# Patient Record
Sex: Male | Born: 1965
Health system: Southern US, Community
[De-identification: ages and names within clinical notes are randomized; demographics above are authoritative.]

## PROBLEM LIST (undated history)

## (undated) DIAGNOSIS — G4733 Obstructive sleep apnea (adult) (pediatric): Secondary | ICD-10-CM

## (undated) DIAGNOSIS — I219 Acute myocardial infarction, unspecified: Secondary | ICD-10-CM

## (undated) DIAGNOSIS — R4701 Aphasia: Secondary | ICD-10-CM

## (undated) DIAGNOSIS — J45909 Unspecified asthma, uncomplicated: Secondary | ICD-10-CM

## (undated) DIAGNOSIS — I201 Angina pectoris with documented spasm: Secondary | ICD-10-CM

## (undated) DIAGNOSIS — R55 Syncope and collapse: Secondary | ICD-10-CM

## (undated) DIAGNOSIS — H3530 Unspecified macular degeneration: Secondary | ICD-10-CM

## (undated) DIAGNOSIS — H409 Unspecified glaucoma: Secondary | ICD-10-CM

## (undated) DIAGNOSIS — I495 Sick sinus syndrome: Secondary | ICD-10-CM

## (undated) DIAGNOSIS — I6529 Occlusion and stenosis of unspecified carotid artery: Secondary | ICD-10-CM

## (undated) DIAGNOSIS — F32A Depression, unspecified: Secondary | ICD-10-CM

## (undated) DIAGNOSIS — G4730 Sleep apnea, unspecified: Secondary | ICD-10-CM

## (undated) DIAGNOSIS — R0600 Dyspnea, unspecified: Secondary | ICD-10-CM

## (undated) DIAGNOSIS — E119 Type 2 diabetes mellitus without complications: Secondary | ICD-10-CM

## (undated) DIAGNOSIS — J449 Chronic obstructive pulmonary disease, unspecified: Secondary | ICD-10-CM

## (undated) DIAGNOSIS — E669 Obesity, unspecified: Secondary | ICD-10-CM

## (undated) HISTORY — DX: Obesity, unspecified: E66.9

## (undated) HISTORY — DX: Syncope and collapse: R55

## (undated) HISTORY — PX: PACEMAKER INSERTION: SHX728

## (undated) HISTORY — DX: Unspecified glaucoma: H40.9

## (undated) HISTORY — PX: INSERT / REPLACE / REMOVE PACEMAKER: SUR710

## (undated) HISTORY — DX: Sick sinus syndrome: I49.5

## (undated) HISTORY — DX: Obstructive sleep apnea (adult) (pediatric): G47.33

## (undated) HISTORY — PX: CARDIAC CATHETERIZATION: SHX172

## (undated) HISTORY — DX: Chronic obstructive pulmonary disease, unspecified: J44.9

## (undated) HISTORY — PX: INCISION AND DRAINAGE ABSCESS: SHX5864

## (undated) HISTORY — DX: Aphasia: R47.01

## (undated) HISTORY — DX: Unspecified macular degeneration: H35.30

## (undated) HISTORY — DX: Angina pectoris with documented spasm: I20.1

## (undated) HISTORY — DX: Occlusion and stenosis of unspecified carotid artery: I65.29

## (undated) HISTORY — DX: Sleep apnea, unspecified: G47.30

---

## 2003-11-27 DIAGNOSIS — I201 Angina pectoris with documented spasm: Secondary | ICD-10-CM | POA: Insufficient documentation

## 2008-04-06 ENCOUNTER — Encounter: Payer: Self-pay | Admitting: Pulmonary Disease

## 2015-07-14 DIAGNOSIS — H409 Unspecified glaucoma: Secondary | ICD-10-CM | POA: Insufficient documentation

## 2015-07-14 DIAGNOSIS — H3530 Unspecified macular degeneration: Secondary | ICD-10-CM | POA: Insufficient documentation

## 2015-07-14 DIAGNOSIS — R4701 Aphasia: Secondary | ICD-10-CM | POA: Insufficient documentation

## 2015-07-14 DIAGNOSIS — R519 Headache, unspecified: Secondary | ICD-10-CM | POA: Insufficient documentation

## 2015-07-14 DIAGNOSIS — G4730 Sleep apnea, unspecified: Secondary | ICD-10-CM | POA: Insufficient documentation

## 2015-07-14 DIAGNOSIS — Z950 Presence of cardiac pacemaker: Secondary | ICD-10-CM | POA: Insufficient documentation

## 2016-02-05 ENCOUNTER — Encounter: Payer: Self-pay | Admitting: *Deleted

## 2016-02-06 ENCOUNTER — Encounter: Payer: Self-pay | Admitting: Cardiology

## 2016-02-06 NOTE — Progress Notes (Deleted)
    Cardiology Office Note  Date: 02/06/2016   ID: Timothy Thomas, DOB 1965/09/02, MRN [id number]  PCP: No primary care provider on file.  Referring provider: Dr. [name]  Consulting Cardiologist: Timothy Dell, MD   No chief complaint on file.   History of Present Illness: Timothy Thomas is a 50 y.o. male referred for cardiology consultation by Dr. [name] (hospitalist at [facility]). He does not have a primary care provider. Very limited records were provided. He was recently admitted to [facility] with chest discomfort and shortness of breath associated with a degree of hypoxemic respiratory failure. He was treated for COPD exacerbation with ongoing tobacco abuse history, improved with nebulizer treatments.  Past Medical History:  Diagnosis Date  . Aphasia    Transient - negative head CT and EEG with neurology workup February 2017 [facility])  . COPD (chronic obstructive pulmonary disease) (HCC)   . Glaucoma   . Macular degeneration   . Obesity   . Obstructive sleep apnea   . Tachycardia-bradycardia syndrome (HCC)    Status post pacemaker  . Variant angina (HCC)    History of normal coronary arteries at cardiac catheterization 2002 - vasospasm noted  . Vasovagal syncope     No past surgical history on file.  Current Outpatient Prescriptions  Medication Sig Dispense Refill  . atorvastatin (LIPITOR) 40 MG tablet Take 40 mg by mouth daily.    . Ipratropium-Albuterol (ALBUTEROL-IPRATROPIUM IN) Inhale 3 mLs into the lungs 4 (four) times daily.     No current facility-administered medications for this visit.    Allergies:  Codeine and Ketorolac   Social History: The patient     Family History: The patient's family history is not on file.   ROS:  Please see the history of present illness. Otherwise, complete review of systems is positive for {NONE DEFAULTED:18576::"none"}.  All other systems are reviewed and negative.   Physical Exam: VS:  There were no vitals  taken for this visit., BMI There is no height or weight on file to calculate BMI.  Wt Readings from Last 3 Encounters:  No data found for Wt    General: Patient appears comfortable at rest. HEENT: Conjunctiva and lids normal, oropharynx clear with moist mucosa. Neck: Supple, no elevated JVP or carotid bruits, no thyromegaly. Lungs: Clear to auscultation, nonlabored breathing at rest. Cardiac: Regular rate and rhythm, no S3 or significant systolic murmur, no pericardial rub. Abdomen: Soft, nontender, no hepatomegaly, bowel sounds present, no guarding or rebound. Extremities: No pitting edema, distal pulses 2+. Skin: Warm and dry. Musculoskeletal: No kyphosis. Neuropsychiatric: Alert and oriented x3, affect grossly appropriate.  ECG: I personally reviewed the tracing from 01/14/2016 which showed normal sinus rhythm with leftward axis, decreased R wave progression and low voltage in the precordial leads.  Recent Labwork:  August 2017: Cholesterol 233, triglycerides 247, HDL 28, LDL 156, BUN 9, creatinine 0.6, AST 14, ALT 21, potassium 4.3, troponin T less than 0.01, NT-proBNP 14.5, hemoglobin 13.2, platelets 232  Other Studies Reviewed Today:  Chest x-ray 01/14/2016 [facility]): No infiltrate or pulmonary edema. Mild basilar atelectasis. Cardiac pacemaker in place.  Assessment and Plan:    Current medicines were reviewed with the patient today.  No orders of the defined types were placed in this encounter.   Disposition:  Signed, Jonelle Sidle, MD, [facility] 02/06/2016 8:04 AM    [facility] at [facility] [address] [place], [place], [place] [address] Phone: [phone]; Fax: [phone]

## 2016-09-06 DIAGNOSIS — I2699 Other pulmonary embolism without acute cor pulmonale: Secondary | ICD-10-CM | POA: Insufficient documentation

## 2016-09-26 ENCOUNTER — Inpatient Hospital Stay (HOSPITAL_COMMUNITY)
Admission: EM | Admit: 2016-09-26 | Discharge: 2016-09-29 | DRG: 176 | Disposition: A | Discharge status: Home / Self Care | Payer: Self-pay | Source: Other Acute Inpatient Hospital | Attending: Cardiovascular Disease | Admitting: Cardiovascular Disease

## 2016-09-26 ENCOUNTER — Encounter (HOSPITAL_COMMUNITY): Payer: Self-pay | Admitting: *Deleted

## 2016-09-26 DIAGNOSIS — F17210 Nicotine dependence, cigarettes, uncomplicated: Secondary | ICD-10-CM | POA: Diagnosis present

## 2016-09-26 DIAGNOSIS — E669 Obesity, unspecified: Secondary | ICD-10-CM | POA: Diagnosis present

## 2016-09-26 DIAGNOSIS — I2699 Other pulmonary embolism without acute cor pulmonale: Principal | ICD-10-CM | POA: Diagnosis present

## 2016-09-26 DIAGNOSIS — I2510 Atherosclerotic heart disease of native coronary artery without angina pectoris: Secondary | ICD-10-CM | POA: Diagnosis present

## 2016-09-26 DIAGNOSIS — Z885 Allergy status to narcotic agent status: Secondary | ICD-10-CM

## 2016-09-26 DIAGNOSIS — E876 Hypokalemia: Secondary | ICD-10-CM | POA: Diagnosis present

## 2016-09-26 DIAGNOSIS — I248 Other forms of acute ischemic heart disease: Secondary | ICD-10-CM | POA: Diagnosis present

## 2016-09-26 DIAGNOSIS — Y838 Other surgical procedures as the cause of abnormal reaction of the patient, or of later complication, without mention of misadventure at the time of the procedure: Secondary | ICD-10-CM | POA: Diagnosis present

## 2016-09-26 DIAGNOSIS — J449 Chronic obstructive pulmonary disease, unspecified: Secondary | ICD-10-CM | POA: Diagnosis present

## 2016-09-26 DIAGNOSIS — R55 Syncope and collapse: Secondary | ICD-10-CM | POA: Diagnosis present

## 2016-09-26 DIAGNOSIS — R001 Bradycardia, unspecified: Secondary | ICD-10-CM | POA: Diagnosis present

## 2016-09-26 DIAGNOSIS — T82119A Breakdown (mechanical) of unspecified cardiac electronic device, initial encounter: Secondary | ICD-10-CM | POA: Diagnosis present

## 2016-09-26 DIAGNOSIS — Z825 Family history of asthma and other chronic lower respiratory diseases: Secondary | ICD-10-CM

## 2016-09-26 DIAGNOSIS — E785 Hyperlipidemia, unspecified: Secondary | ICD-10-CM | POA: Diagnosis present

## 2016-09-26 DIAGNOSIS — G4733 Obstructive sleep apnea (adult) (pediatric): Secondary | ICD-10-CM | POA: Diagnosis present

## 2016-09-26 DIAGNOSIS — Z833 Family history of diabetes mellitus: Secondary | ICD-10-CM

## 2016-09-26 DIAGNOSIS — Z8249 Family history of ischemic heart disease and other diseases of the circulatory system: Secondary | ICD-10-CM

## 2016-09-26 DIAGNOSIS — H3530 Unspecified macular degeneration: Secondary | ICD-10-CM | POA: Diagnosis present

## 2016-09-26 DIAGNOSIS — Z79899 Other long term (current) drug therapy: Secondary | ICD-10-CM

## 2016-09-26 DIAGNOSIS — Z6841 Body Mass Index (BMI) 40.0 and over, adult: Secondary | ICD-10-CM

## 2016-09-26 DIAGNOSIS — Z888 Allergy status to other drugs, medicaments and biological substances status: Secondary | ICD-10-CM

## 2016-09-26 DIAGNOSIS — H409 Unspecified glaucoma: Secondary | ICD-10-CM | POA: Diagnosis present

## 2016-09-26 LAB — MRSA PCR SCREENING: MRSA BY PCR: POSITIVE — AB

## 2016-09-26 MED ORDER — ASPIRIN EC 81 MG PO TBEC
81.0000 mg | DELAYED_RELEASE_TABLET | Freq: Every day | ORAL | Status: DC
  Administered 2016-09-27 – 2016-09-29 (×3): 81 mg via ORAL
  Filled 2016-09-26 (×3): qty 1

## 2016-09-26 MED ORDER — SODIUM CHLORIDE 0.9 % IV SOLN: INTRAVENOUS | Status: DC

## 2016-09-26 MED ORDER — ONDANSETRON HCL 4 MG PO TABS
4.0000 mg | ORAL_TABLET | Freq: Four times a day (QID) | ORAL | Status: DC | PRN
Start: 2016-09-26 — End: 2016-09-29

## 2016-09-26 MED ORDER — ACETAMINOPHEN 650 MG RE SUPP: 650.0000 mg | Freq: Four times a day (QID) | RECTAL | Status: DC | PRN

## 2016-09-26 MED ORDER — ACETAMINOPHEN 325 MG PO TABS
650.0000 mg | ORAL_TABLET | Freq: Four times a day (QID) | ORAL | Status: DC | PRN
  Administered 2016-09-28 (×3): 650 mg via ORAL
  Filled 2016-09-26 (×4): qty 2

## 2016-09-26 MED ORDER — ONDANSETRON HCL 4 MG/2ML IJ SOLN: 4.0000 mg | Freq: Four times a day (QID) | INTRAMUSCULAR | Status: DC | PRN

## 2016-09-26 MED ORDER — DOCUSATE SODIUM 100 MG PO CAPS
100.0000 mg | ORAL_CAPSULE | Freq: Two times a day (BID) | ORAL | Status: DC
  Administered 2016-09-27 (×2): 100 mg via ORAL
  Filled 2016-09-26 (×4): qty 1

## 2016-09-26 MED ORDER — SODIUM CHLORIDE 0.9% FLUSH
3.0000 mL | Freq: Two times a day (BID) | INTRAVENOUS | Status: DC
  Administered 2016-09-26 – 2016-09-28 (×3): 3 mL via INTRAVENOUS

## 2016-09-26 NOTE — H&P (Signed)
Referring Physician: ER Physician/ [facility]  Timothy Thomas is an 51 y.o. male.                       Chief Complaint: Syncope  HPI: 51 year old male with PMH of COPD, Tachy-brady syndrome, morbid obesity and sleep apnea was sitting, watching 2 people mowing yard, felt cold and shivering and passed out for few seconds. When he woke up he was dizzy for a while. He has no chest pain or shortness of breath with activity. CT angio chest at [facility] showed submassive PE in right lung. His troponin was minimally elevated. His EKG showed ST depression in lateral leads and S1, Q3,T3. IV heparin was started. He had Pacemaker placed 12 years ago for 1 episode of sinus bradycardia. He had variable follow up on pacemaker functioning ( 1 year to 7 years of no follow up ) and today's interogation by referring facility showed non-functioning pacemaker. He has maintained heart rate over 60/min so far.   Past Medical History:  Diagnosis Date  . Aphasia    Transient - negative head CT and EEG with neurology workup February 2017 [facility])  . COPD (chronic obstructive pulmonary disease) (HCC)   . Glaucoma   . Macular degeneration   . Obesity   . Obstructive sleep apnea   . Tachycardia-bradycardia syndrome (HCC)    Status post pacemaker  . Variant angina (HCC)    History of normal coronary arteries at cardiac catheterization 2002 - vasospasm noted  . Vasovagal syncope       No past surgical history on file.  Family history: Mom, living, age 40 yrs and healthy. Dad, living with hypertension, heart disease, diabetes and COPD. 2 Brothers and 2 sisters, living and well  Personal history: Married, wife is 44 year old with hypothyroidism. He has one step son and one step daughter. He works as Museum/gallery exhibitions officer for 30 years.   Social History:  Current 1 pack /day cigarette smoker, has 40 pack yr history of smoking.   Allergies:  Allergies  Allergen Reactions  . Codeine    Vomiting   . Ketorolac     Vomiting      Medications Prior to Admission  Medication Sig Dispense Refill  . atorvastatin (LIPITOR) 40 MG tablet Take 40 mg by mouth daily.    . Ipratropium-Albuterol (ALBUTEROL-IPRATROPIUM IN) Inhale 3 mLs into the lungs 4 (four) times daily.      No results found for this or any previous visit (from the past 48 hour(s)). No results found.  Review Of Systems Constitutional: No fever, positive chills, weight loss, positive weight gain. Eyes: No vision change, wears glasses. No discharge or pain. Ears: No hearing loss, No tinnitus. Respiratory: No asthma, COPD, pneumonias. No shortness of breath. No hemoptysis. Cardiovascular: No chest pain, palpitation, leg edema. Gastrointestinal: No nausea, vomiting, diarrhea, constipation. No GI bleed. No hepatitis. Genitourinary: No dysuria, hematuria, kidney stone. No incontinance. Neurological: No headache, stroke, seizures.  Psychiatry: No psych facility admission for anxiety, depression, suicide. No detox. Skin: No rash. Musculoskeletal: Positive joint pain, no fibromyalgia. Positive neck pain, back pain. Lymphadenopathy: No lymphadenopathy. Hematology: No anemia or easy bruising.   Blood pressure (!) 93/48, pulse 66, temperature 97.6 F (36.4 C), temperature source Oral, resp. rate (!) 22, height 5\' 10"  (1.778 m), weight (!) 162.7 kg (358 lb 11.2 oz), SpO2 98 %. Body mass index is 51.47 kg/m. General appearance: alert, cooperative, appears stated age and  no distress Head: Normocephalic, atraumatic. Eyes: Hazel eyes, pink conjunctiva, corneas clear. PERRL, EOM's intact. Neck: No adenopathy, no carotid bruit, no JVD, supple, symmetrical, trachea midline and thyroid not enlarged. Resp: Clear to auscultation bilaterally. Cardio: Regular rate and rhythm, S1, S2 normal, II/VI systolic murmur, no click, rub or gallop GI: Soft, non-tender; bowel sounds normal; no organomegaly. Extremities: No edema, cyanosis or  clubbing. Skin: Warm and dry.  Neurologic: Alert and oriented X 3, normal strength. Normal coordination. Moves all 4 extremities.  Assessment/Plan Acute pulmonary embolism Morbid Obesity H/O Tachy-brady syndrome Sleep apnea Abnormal troponin-I, r/o MI  Admit IV heparin. IV fluids. Telemetry. Check TSH.   Ricki Rodriguez, MD  09/26/2016, 10:26 PM

## 2016-09-26 NOTE — Progress Notes (Signed)
RT set up CPAP and placed on patient. Patient tolerating well at this time with no respiratory distress noted. RT will monitor as needed. 

## 2016-09-27 ENCOUNTER — Inpatient Hospital Stay (HOSPITAL_COMMUNITY): Payer: Self-pay

## 2016-09-27 ENCOUNTER — Encounter (HOSPITAL_COMMUNITY): Payer: Self-pay | Admitting: *Deleted

## 2016-09-27 DIAGNOSIS — I2699 Other pulmonary embolism without acute cor pulmonale: Secondary | ICD-10-CM

## 2016-09-27 LAB — CBC
HEMATOCRIT: 35 % — AB (ref 39.0–52.0)
HEMOGLOBIN: 11.1 g/dL — AB (ref 13.0–17.0)
MCH: 27.5 pg (ref 26.0–34.0)
MCHC: 31.7 g/dL (ref 30.0–36.0)
MCV: 86.8 fL (ref 78.0–100.0)
Platelets: 198 10*3/uL (ref 150–400)
RBC: 4.03 MIL/uL — ABNORMAL LOW (ref 4.22–5.81)
RDW: 14.6 % (ref 11.5–15.5)
WBC: 13.1 10*3/uL — ABNORMAL HIGH (ref 4.0–10.5)

## 2016-09-27 LAB — ECHOCARDIOGRAM COMPLETE
Height: 70 in
Weight: 5739.01 oz

## 2016-09-27 LAB — BASIC METABOLIC PANEL
ANION GAP: 8 (ref 5–15)
BUN: 10 mg/dL (ref 6–20)
CO2: 23 mmol/L (ref 22–32)
Calcium: 8.4 mg/dL — ABNORMAL LOW (ref 8.9–10.3)
Chloride: 106 mmol/L (ref 101–111)
Creatinine, Ser: 0.65 mg/dL (ref 0.61–1.24)
GFR calc Af Amer: >60 mL/min (ref >60)
GLUCOSE: 169 mg/dL — AB (ref 65–99)
POTASSIUM: 3.2 mmol/L — AB (ref 3.5–5.1)
Sodium: 137 mmol/L (ref 135–145)

## 2016-09-27 LAB — TSH: TSH: 2.491 u[IU]/mL (ref 0.350–4.500)

## 2016-09-27 LAB — PROTIME-INR
INR: 1.26
Prothrombin Time: 15.9 seconds — ABNORMAL HIGH (ref 11.4–15.2)

## 2016-09-27 LAB — HIV ANTIBODY (ROUTINE TESTING W REFLEX): HIV Screen 4th Generation wRfx: NONREACTIVE

## 2016-09-27 LAB — LIPID PANEL
CHOLESTEROL: 179 mg/dL (ref 0–200)
HDL: 21 mg/dL — AB (ref >40)
LDL CALC: 112 mg/dL — AB (ref 0–99)
TRIGLYCERIDES: 228 mg/dL — AB (ref <150)
Total CHOL/HDL Ratio: 8.5 RATIO
VLDL: 46 mg/dL — ABNORMAL HIGH (ref 0–40)

## 2016-09-27 LAB — HEPARIN LEVEL (UNFRACTIONATED)
HEPARIN UNFRACTIONATED: 0.17 [IU]/mL — AB (ref 0.30–0.70)
Heparin Unfractionated: <0.1 IU/mL — ABNORMAL LOW (ref 0.30–0.70)
Heparin Unfractionated: 0.1 IU/mL — ABNORMAL LOW (ref 0.30–0.70)

## 2016-09-27 LAB — TROPONIN I
Troponin I: 0.3 ng/mL (ref <0.03)
Troponin I: 0.17 ng/mL (ref <0.03)
Troponin I: 0.11 ng/mL (ref <0.03)

## 2016-09-27 LAB — T4, FREE: Free T4: 0.74 ng/dL (ref 0.61–1.12)

## 2016-09-27 MED ORDER — POTASSIUM CHLORIDE ER 10 MEQ PO TBCR
10.0000 meq | EXTENDED_RELEASE_TABLET | Freq: Three times a day (TID) | ORAL | Status: DC
  Administered 2016-09-27 – 2016-09-29 (×5): 10 meq via ORAL
  Filled 2016-09-27 (×11): qty 1

## 2016-09-27 MED ORDER — ATORVASTATIN CALCIUM 40 MG PO TABS
40.0000 mg | ORAL_TABLET | Freq: Every day | ORAL | Status: DC
  Administered 2016-09-27: 40 mg via ORAL
  Filled 2016-09-27: qty 1

## 2016-09-27 MED ORDER — HEPARIN (PORCINE) IN NACL 100-0.45 UNIT/ML-% IJ SOLN
2600.0000 [IU]/h | INTRAMUSCULAR | Status: DC
  Administered 2016-09-27: 2400 [IU]/h via INTRAVENOUS
  Administered 2016-09-27: 1700 [IU]/h via INTRAVENOUS
  Administered 2016-09-28: 2600 [IU]/h via INTRAVENOUS
  Filled 2016-09-27 (×3): qty 250

## 2016-09-27 MED ORDER — HEPARIN BOLUS VIA INFUSION
3500.0000 [IU] | Freq: Once | INTRAVENOUS | Status: AC
  Administered 2016-09-27: 3500 [IU] via INTRAVENOUS
  Filled 2016-09-27: qty 3500

## 2016-09-27 MED ORDER — ALBUTEROL SULFATE (2.5 MG/3ML) 0.083% IN NEBU: 2.5000 mg | INHALATION_SOLUTION | RESPIRATORY_TRACT | Status: DC | PRN

## 2016-09-27 MED ORDER — CHLORHEXIDINE GLUCONATE CLOTH 2 % EX PADS
6.0000 | MEDICATED_PAD | Freq: Every day | CUTANEOUS | Status: DC
  Administered 2016-09-28: 6 via TOPICAL

## 2016-09-27 MED ORDER — MUPIROCIN 2 % EX OINT
1.0000 "application " | TOPICAL_OINTMENT | Freq: Two times a day (BID) | CUTANEOUS | Status: DC
  Administered 2016-09-27 – 2016-09-29 (×5): 1 via NASAL
  Filled 2016-09-27 (×4): qty 22

## 2016-09-27 MED ORDER — ALBUTEROL SULFATE (2.5 MG/3ML) 0.083% IN NEBU
2.5000 mg | INHALATION_SOLUTION | Freq: Four times a day (QID) | RESPIRATORY_TRACT | Status: DC
  Filled 2016-09-27: qty 3

## 2016-09-27 MED ORDER — PERFLUTREN LIPID MICROSPHERE
1.0000 mL | INTRAVENOUS | Status: AC | PRN
  Filled 2016-09-27: qty 10

## 2016-09-27 NOTE — Progress Notes (Signed)
ANTICOAGULATION CONSULT NOTE - Follow-up Consult  Pharmacy Consult for heparin Indication: pulmonary embolus and r/o MI  Allergies  Allergen Reactions  . Codeine Nausea And Vomiting    Low tolerance to narcotics  . Ketorolac Nausea And Vomiting    Vomiting      Patient Measurements: Height: 5\' 10"  (177.8 cm) Weight: (!) 358 lb 11 oz (162.7 kg) IBW/kg (Calculated) : 73 Heparin Dosing Weight: 115kg  Vital Signs: Temp: 98.7 F (37.1 C) (04/22 1709) Temp Source: Oral (04/22 1709) BP: 127/76 (04/22 1709) Pulse Rate: 51 (04/22 1709)  Labs:  Recent Labs  09/26/16 2359 09/27/16 0431 09/27/16 1128 09/27/16 1816  HGB  --  11.1*  --   --   HCT  --  35.0*  --   --   PLT  --  198  --   --   LABPROT  --  15.9*  --   --   INR  --  1.26  --   --   HEPARINUNFRC  --  <0.10* 0.10* 0.17*  CREATININE  --  0.65  --   --   TROPONINI 0.30* 0.17* 0.11*  --      Assessment: 51yo male c/o syncopal event, presented to OSH where CT revealed RUL PE w/ evidence of RHS, troponin also elevated - now trending down, started on heparin and tx'd to [facility]. Not on anticoagulation PTA.  Heparin level remains subtherapeutic (0.17) despite rebolusing and escalating the dose. No issues with the line.   Goal of Therapy:  Heparin level 0.3-0.7 units/ml Monitor platelets by anticoagulation protocol: Yes   Plan:  Re-bolus heparin 3500 units Increase heparin gtt to 2400 units/h 6hr heparin level Daily heparin level/CBC Monitor for s/sx bleeding F/u long-term anticoagulation plan  Lysle Pearl, PharmD, BCPS Pager # [phone] 09/27/2016 7:52 PM

## 2016-09-27 NOTE — Progress Notes (Signed)
CPAP is setup and ready at patient's bedside. Patient stated he would self administer when he goes to bed. RT advised patient to have RT called if he needs assistance. RT will monitor as needed.

## 2016-09-27 NOTE — Progress Notes (Signed)
*  PRELIMINARY RESULTS* Vascular Ultrasound Bilateral lower extremity venous duplex has been completed.  Preliminary findings: No evidence of deep vein thrombosis in the visualized veins of the lower extremities.  Negative for baker's cysts bilaterally.   Chauncey Fischer 09/27/2016, 11:52 AM

## 2016-09-27 NOTE — Progress Notes (Signed)
ANTICOAGULATION CONSULT NOTE - Follow-up Consult  Pharmacy Consult for heparin Indication: pulmonary embolus and r/o MI  Allergies  Allergen Reactions  . Codeine Nausea And Vomiting    Vomiting   . Ketorolac Nausea And Vomiting    Vomiting      Patient Measurements: Height: 5\' 10"  (177.8 cm) Weight: (!) 358 lb 11 oz (162.7 kg) IBW/kg (Calculated) : 73 Heparin Dosing Weight: 115kg  Vital Signs: Temp: 98.2 F (36.8 C) (04/22 0300) Temp Source: Oral (04/22 0807) BP: 88/69 (04/22 0807) Pulse Rate: 53 (04/22 0807)  Labs:  Recent Labs  09/26/16 2359 09/27/16 0431  HGB  --  11.1*  HCT  --  35.0*  PLT  --  198  LABPROT  --  15.9*  INR  --  1.26  HEPARINUNFRC  --  <0.10*  CREATININE  --  0.65  TROPONINI 0.30* 0.17*     Assessment: 51yo male c/o syncopal event, presented to OSH where CT revealed RUL PE w/ evidence of RHS, troponin also elevated - now trending down, started on heparin and tx'd to [facility]. Not on anticoagulation PTA.  Heparin level remains subtherapeutic (0.1) on re-bolus gtt increased to 1700 units/hr. Drip has not been off and bleeding/IV line issues per RN. Hgb down a bit from OSH labs at 11.1, plt wnl.  Goal of Therapy:  Heparin level 0.3-0.7 units/ml Monitor platelets by anticoagulation protocol: Yes   Plan:  Re-bolus heparin 3500 units Increase heparin gtt to 2050 units/h 6hr heparin level Daily heparin level/CBC Monitor for s/sx bleeding F/u long-term anticoagulation plan   Babs Bertin, PharmD, BCPS Clinical Pharmacist 09/27/2016 10:56 AM

## 2016-09-27 NOTE — Progress Notes (Signed)
ANTICOAGULATION CONSULT NOTE - Follow-up Consult  Pharmacy Consult for heparin Indication: pulmonary embolus and r/o MI  Allergies  Allergen Reactions  . Codeine Nausea And Vomiting    Vomiting   . Ketorolac Nausea And Vomiting    Vomiting      Patient Measurements: Height: 5\' 10"  (177.8 cm) Weight: (!) 358 lb 11 oz (162.7 kg) IBW/kg (Calculated) : 73 Heparin Dosing Weight: 115kg  Vital Signs: Temp: 98.2 F (36.8 C) (04/22 0300) Temp Source: Oral (04/22 0300) BP: 109/63 (04/22 0400) Pulse Rate: 49 (04/22 0400)  Labs:  Recent Labs  09/26/16 2359 09/27/16 0431  HGB  --  11.1*  HCT  --  35.0*  PLT  --  198  LABPROT  --  15.9*  INR  --  1.26  HEPARINUNFRC  --  <0.10*  TROPONINI 0.30*  --      Assessment: 51yo male c/o syncopal event, presented to OSH where CT revealed RUL PE w/ evidence of RHS, troponin also elevated, started on heparin and tx'd to [facility].    Heparin level undetectable on gtt at 1200 units/hr (from OSH). No issues with line or bleeding reported per RN. Hgb down a bit from OSH labs.  Goal of Therapy:  Heparin level 0.3-0.7 units/ml Monitor platelets by anticoagulation protocol: Yes   Plan:  Rebolus 3500 units Increase heparin gtt to 1700 units/h Will f/u 6hr heparin level  Christoper Fabian, PharmD, BCPS Clinical pharmacist, pager [phone] 09/27/2016,5:36 AM

## 2016-09-27 NOTE — Progress Notes (Signed)
Pt's left forearm is slightly swollen, as noted in earlier assessment, and pt is complaining of itching and tightness in the left forearm. Lotion applied for dryness, flakiness and itchiness. No marked redness noted, but will continue to assess it for changes. Will inform pm shift RN of this complaint and be sure physician is aware on morning rounds 4/23.

## 2016-09-27 NOTE — Progress Notes (Addendum)
Ref: No primary care provider on file.   Subjective:  Awake. No chest pain, palpitation or dizziness. Heart rate in 40's during sleep. Lower leg DVT study is negative. TSH- normal.   Objective:  Vital Signs in the last 24 hours: Temp:  [97.6 F (36.4 C)-98.7 F (37.1 C)] 98.7 F (37.1 C) (04/22 1709) Pulse Rate:  [46-73] 51 (04/22 1709) Cardiac Rhythm: Normal sinus rhythm (04/22 1200) Resp:  [11-27] 27 (04/22 1709) BP: (85-127)/(42-95) 127/76 (04/22 1709) SpO2:  [94 %-100 %] 99 % (04/22 1709) Weight:  [162.7 kg (358 lb 11 oz)-162.7 kg (358 lb 11.2 oz)] 162.7 kg (358 lb 11 oz) (04/22 0500)  Physical Exam: BP Readings from Last 1 Encounters:  09/27/16 127/76    Wt Readings from Last 1 Encounters:  09/27/16 (!) 162.7 kg (358 lb 11 oz)    Weight change:  Body mass index is 51.47 kg/m. HEENT: [place]/AT, Eyes- PERL, EOMI, Conjunctiva-Pink, Sclera-Non-icteric Neck: No JVD, No bruit, Trachea midline. Lungs:  Clear, Bilateral. Cardiac:  Regular rhythm, normal S1 and S2, no S3. II/VI systolic murmur. Abdomen:  Soft, non-tender. BS present. Extremities:  No edema present. No cyanosis. No clubbing. CNS: AxOx3, Cranial nerves grossly intact, moves all 4 extremities.  Skin: Warm and dry.   Intake/Output from previous day: 04/21 0701 - 04/22 0700 In: 544.3 [P.O.:120; I.V.:424.3] Out: -     Lab Results: BMET    Component Value Date/Time   NA 137 09/27/2016 0431   K 3.2 (L) 09/27/2016 0431   CL 106 09/27/2016 0431   CO2 23 09/27/2016 0431   GLUCOSE 169 (H) 09/27/2016 0431   BUN 10 09/27/2016 0431   CREATININE 0.65 09/27/2016 0431   CALCIUM 8.4 (L) 09/27/2016 0431   GFRNONAA >60 09/27/2016 0431   GFRAA >60 09/27/2016 0431   CBC    Component Value Date/Time   WBC 13.1 (H) 09/27/2016 0431   RBC 4.03 (L) 09/27/2016 0431   HGB 11.1 (L) 09/27/2016 0431   HCT 35.0 (L) 09/27/2016 0431   PLT 198 09/27/2016 0431   MCV 86.8 09/27/2016 0431   MCH 27.5 09/27/2016 0431   MCHC 31.7  09/27/2016 0431   RDW 14.6 09/27/2016 0431   HEPATIC Function Panel No results for input(s): PROT in the last 8760 hours.  Invalid input(s):  ALBUMIN,  AST,  ALT,  ALKPHOS,  BILIDIR,  IBILI HEMOGLOBIN A1C No components found for: HGA1C,  MPG CARDIAC ENZYMES Lab Results  Component Value Date   TROPONINI 0.11 (HH) 09/27/2016   TROPONINI 0.17 (HH) 09/27/2016   TROPONINI 0.30 (HH) 09/26/2016   BNP No results for input(s): PROBNP in the last 8760 hours. TSH  Recent Labs  09/27/16 0431  TSH 2.491   CHOLESTEROL  Recent Labs  09/27/16 0432  CHOL 179    Scheduled Meds: . aspirin EC  81 mg Oral Daily  . [START ON 09/28/2016] Chlorhexidine Gluconate Cloth  6 each Topical Q0600  . docusate sodium  100 mg Oral BID  . mupirocin ointment  1 application Nasal BID  . sodium chloride flush  3 mL Intravenous Q12H   Continuous Infusions: . sodium chloride 50 mL/hr at 09/26/16 2230  . heparin 2,050 Units/hr (09/27/16 1500)   PRN Meds:.acetaminophen **OR** acetaminophen, ondansetron **OR** ondansetron (ZOFRAN) IV, perflutren lipid microspheres (DEFINITY) IV suspension  Assessment/Plan: Acute pulmonary embolism Morbid obesity H/O Tachy-brady syndrome Sleep apnea Dyslipidemia Abnormal troponin-I Hypokalemia  Continue IV heparin. Potassium supplement Atorvastatin for lipids. Breathing treatments. Consider cardiac catheterization.   LOS:  1 day    Orpah Cobb  MD  09/27/2016, 5:35 PM

## 2016-09-27 NOTE — Progress Notes (Addendum)
ANTICOAGULATION CONSULT NOTE - Initial Consult  Pharmacy Consult for heparin Indication: pulmonary embolus and r/o MI  Allergies  Allergen Reactions  . Codeine     Vomiting   . Ketorolac     Vomiting      Patient Measurements: Height: 5\' 10"  (177.8 cm) Weight: (!) 358 lb 11.2 oz (162.7 kg) IBW/kg (Calculated) : 73 Heparin Dosing Weight: 115kg  Vital Signs: Temp: 97.8 F (36.6 C) (04/21 2337) Temp Source: Oral (04/21 2337) BP: 85/43 (04/22 0100) Pulse Rate: 56 (04/22 0100)  Labs:  Recent Labs  09/26/16 2359  TROPONINI 0.30*     Medical History: Past Medical History:  Diagnosis Date  . Aphasia    Transient - negative head CT and EEG with neurology workup February 2017 [facility])  . COPD (chronic obstructive pulmonary disease) (HCC)   . Glaucoma   . Macular degeneration   . Obesity   . Obstructive sleep apnea   . Tachycardia-bradycardia syndrome (HCC)    Status post pacemaker  . Variant angina (HCC)    History of normal coronary arteries at cardiac catheterization 2002 - vasospasm noted  . Vasovagal syncope     Assessment: 51yo male c/o syncopal event, presented to OSH where CT revealed RUL PE w/ evidence of RHS, troponin also elevated, started on heparin and tx'd to [facility].  Troponin now higher.  Labs from OSH: K 3.6, SCr 0.66, trop 0.1, WBC 14.8, Hgb 13.4, Plt 217  Goal of Therapy:  Heparin level 0.3-0.7 units/ml Monitor platelets by anticoagulation protocol: Yes   Plan:  OSH gave heparin 7500 units bolus followed by gtt at 1200 units/hr; will continue for now and monitor heparin levels and CBC.  Vernard Gambles, PharmD, BCPS  09/27/2016,1:52 AM

## 2016-09-28 ENCOUNTER — Encounter (HOSPITAL_COMMUNITY): Payer: Self-pay

## 2016-09-28 ENCOUNTER — Encounter (HOSPITAL_COMMUNITY)
Admission: EM | Disposition: A | Discharge status: Home / Self Care | Payer: Self-pay | Source: Other Acute Inpatient Hospital | Attending: Cardiovascular Disease

## 2016-09-28 HISTORY — PX: LEFT HEART CATH AND CORONARY ANGIOGRAPHY: CATH118249

## 2016-09-28 LAB — CBC
HEMATOCRIT: 36.6 % — AB (ref 39.0–52.0)
HEMOGLOBIN: 11.6 g/dL — AB (ref 13.0–17.0)
MCH: 27.7 pg (ref 26.0–34.0)
MCHC: 31.7 g/dL (ref 30.0–36.0)
MCV: 87.4 fL (ref 78.0–100.0)
PLATELETS: 222 10*3/uL (ref 150–400)
RBC: 4.19 MIL/uL — AB (ref 4.22–5.81)
RDW: 14.4 % (ref 11.5–15.5)
WBC: 10.1 10*3/uL (ref 4.0–10.5)

## 2016-09-28 LAB — BASIC METABOLIC PANEL
Anion gap: 8 (ref 5–15)
BUN: 8 mg/dL (ref 6–20)
CHLORIDE: 106 mmol/L (ref 101–111)
CO2: 25 mmol/L (ref 22–32)
Calcium: 8.9 mg/dL (ref 8.9–10.3)
Creatinine, Ser: 0.63 mg/dL (ref 0.61–1.24)
GFR calc Af Amer: >60 mL/min (ref >60)
GLUCOSE: 99 mg/dL (ref 65–99)
Potassium: 3.8 mmol/L (ref 3.5–5.1)
Sodium: 139 mmol/L (ref 135–145)

## 2016-09-28 LAB — HEPARIN LEVEL (UNFRACTIONATED)
HEPARIN UNFRACTIONATED: 0.36 [IU]/mL (ref 0.30–0.70)
HEPARIN UNFRACTIONATED: 0.38 [IU]/mL (ref 0.30–0.70)

## 2016-09-28 LAB — HEMOGLOBIN A1C
Hgb A1c MFr Bld: 5.9 % — ABNORMAL HIGH (ref 4.8–5.6)
Mean Plasma Glucose: 123 mg/dL

## 2016-09-28 LAB — POCT ACTIVATED CLOTTING TIME: ACTIVATED CLOTTING TIME: 109 s

## 2016-09-28 SURGERY — LEFT HEART CATH AND CORONARY ANGIOGRAPHY
Anesthesia: LOCAL

## 2016-09-28 MED ORDER — HEPARIN (PORCINE) IN NACL 2-0.9 UNIT/ML-% IJ SOLN
INTRAMUSCULAR | Status: DC | PRN
  Administered 2016-09-28: 1000 mL

## 2016-09-28 MED ORDER — SODIUM CHLORIDE 0.9% FLUSH: 3.0000 mL | INTRAVENOUS | Status: DC | PRN

## 2016-09-28 MED ORDER — LIDOCAINE HCL (PF) 1 % IJ SOLN
INTRAMUSCULAR | Status: DC | PRN
  Administered 2016-09-28: 15 mL via SUBCUTANEOUS

## 2016-09-28 MED ORDER — HEPARIN (PORCINE) IN NACL 100-0.45 UNIT/ML-% IJ SOLN
2600.0000 [IU]/h | INTRAMUSCULAR | Status: DC
  Administered 2016-09-29: 2600 [IU]/h via INTRAVENOUS
  Filled 2016-09-28: qty 250

## 2016-09-28 MED ORDER — IOPAMIDOL (ISOVUE-370) INJECTION 76%
INTRAVENOUS | Status: AC
  Filled 2016-09-28: qty 100

## 2016-09-28 MED ORDER — SODIUM CHLORIDE 0.9 % IV SOLN
INTRAVENOUS | Status: AC
  Administered 2016-09-28: 400 mL via INTRAVENOUS

## 2016-09-28 MED ORDER — MIDAZOLAM HCL 2 MG/2ML IJ SOLN
INTRAMUSCULAR | Status: AC
  Filled 2016-09-28: qty 2

## 2016-09-28 MED ORDER — SODIUM CHLORIDE 0.9% FLUSH
3.0000 mL | Freq: Two times a day (BID) | INTRAVENOUS | Status: DC
Start: 2016-09-28 — End: 2016-09-28

## 2016-09-28 MED ORDER — LIDOCAINE HCL 1 % IJ SOLN
INTRAMUSCULAR | Status: AC
  Filled 2016-09-28: qty 20

## 2016-09-28 MED ORDER — IOPAMIDOL (ISOVUE-370) INJECTION 76%
INTRAVENOUS | Status: DC | PRN
Start: 2016-09-28 — End: 2016-09-28
  Administered 2016-09-28: 60 mL via INTRA_ARTERIAL

## 2016-09-28 MED ORDER — FENTANYL CITRATE (PF) 100 MCG/2ML IJ SOLN
INTRAMUSCULAR | Status: DC | PRN
  Administered 2016-09-28: 25 ug via INTRAVENOUS

## 2016-09-28 MED ORDER — SODIUM CHLORIDE 0.9 % IV SOLN
INTRAVENOUS | Status: DC
Start: 2016-09-28 — End: 2016-09-28

## 2016-09-28 MED ORDER — SODIUM CHLORIDE 0.9% FLUSH
3.0000 mL | Freq: Two times a day (BID) | INTRAVENOUS | Status: DC
  Administered 2016-09-29: 3 mL via INTRAVENOUS

## 2016-09-28 MED ORDER — SODIUM CHLORIDE 0.9 % IV SOLN
250.0000 mL | INTRAVENOUS | Status: DC | PRN
  Administered 2016-09-29: 250 mL via INTRAVENOUS

## 2016-09-28 MED ORDER — SODIUM CHLORIDE 0.9 % IV SOLN: 250.0000 mL | INTRAVENOUS | Status: DC | PRN

## 2016-09-28 MED ORDER — RIVAROXABAN 20 MG PO TABS: 20.0000 mg | ORAL_TABLET | Freq: Every day | ORAL | Status: DC

## 2016-09-28 MED ORDER — FENTANYL CITRATE (PF) 100 MCG/2ML IJ SOLN
INTRAMUSCULAR | Status: AC
  Filled 2016-09-28: qty 2

## 2016-09-28 MED ORDER — HEPARIN (PORCINE) IN NACL 2-0.9 UNIT/ML-% IJ SOLN
INTRAMUSCULAR | Status: AC
  Filled 2016-09-28: qty 1000

## 2016-09-28 MED ORDER — OXYCODONE HCL 5 MG PO TABS
5.0000 mg | ORAL_TABLET | Freq: Once | ORAL | Status: AC
  Administered 2016-09-28: 5 mg via ORAL
  Filled 2016-09-28: qty 1

## 2016-09-28 MED ORDER — MIDAZOLAM HCL 2 MG/2ML IJ SOLN
INTRAMUSCULAR | Status: DC | PRN
  Administered 2016-09-28: 1 mg via INTRAVENOUS

## 2016-09-28 MED ORDER — RIVAROXABAN 15 MG PO TABS
15.0000 mg | ORAL_TABLET | Freq: Two times a day (BID) | ORAL | Status: DC
  Administered 2016-09-29: 15 mg via ORAL
  Filled 2016-09-28 (×2): qty 1

## 2016-09-28 SURGICAL SUPPLY — 9 items
CATH 5FR JL3.5 JR4 ANG PIG MP (CATHETERS) ×2 IMPLANT
HOVERMATT SINGLE USE (MISCELLANEOUS) ×2 IMPLANT
KIT HEART LEFT (KITS) ×2 IMPLANT
NEEDLE SMART 18GX3.5CM LONG (NEEDLE) ×2 IMPLANT
PACK CARDIAC CATHETERIZATION (CUSTOM PROCEDURE TRAY) ×2 IMPLANT
SHEATH PINNACLE 5F 10CM (SHEATH) ×2 IMPLANT
SYR MEDRAD MARK V 150ML (SYRINGE) ×2 IMPLANT
TRANSDUCER W/STOPCOCK (MISCELLANEOUS) ×2 IMPLANT
WIRE EMERALD 3MM-J .035X150CM (WIRE) ×2 IMPLANT

## 2016-09-28 NOTE — Progress Notes (Signed)
Ref: No primary care provider on file.   Subjective:  Feeling better. HR in 40's when sleeping and 50's + when sitting. Afebrile. Mild LVH and 45 % EF on echocardiogram. [place] of lower extremities is negative for DVT.   Objective:  Vital Signs in the last 24 hours: Temp:  [97.6 F (36.4 C)-98.7 F (37.1 C)] 98.7 F (37.1 C) (04/23 0834) Pulse Rate:  [43-73] 43 (04/23 0834) Cardiac Rhythm: Sinus bradycardia (04/23 0800) Resp:  [11-27] 11 (04/23 0834) BP: (94-127)/(52-95) 102/67 (04/23 0834) SpO2:  [96 %-100 %] 99 % (04/23 0834) Weight:  [161.8 kg (356 lb 11.2 oz)] 161.8 kg (356 lb 11.2 oz) (04/23 0500)  Physical Exam: BP Readings from Last 1 Encounters:  09/28/16 102/67    Wt Readings from Last 1 Encounters:  09/28/16 (!) 161.8 kg (356 lb 11.2 oz)    Weight change: -0.907 kg (-2 lb) Body mass index is 51.18 kg/m. HEENT: [place]/AT, Eyes- PERL, EOMI, Conjunctiva-Pink, Sclera-Non-icteric Neck: No JVD, No bruit, Trachea midline. Lungs:  Clear, Bilateral. Cardiac:  Regular rhythm, normal S1 and S2, no S3. II/VI systolic murmur. Abdomen:  Soft, non-tender. BS present. Extremities:  No edema present. No cyanosis. No clubbing. CNS: AxOx3, Cranial nerves grossly intact, moves all 4 extremities.  Skin: Warm and dry.   Intake/Output from previous day: 04/22 0701 - 04/23 0700 In: 2546 [P.O.:1060; I.V.:1486] Out: 2150 [Urine:2150]    Lab Results: BMET    Component Value Date/Time   NA 139 09/28/2016 0212   NA 137 09/27/2016 0431   K 3.8 09/28/2016 0212   K 3.2 (L) 09/27/2016 0431   CL 106 09/28/2016 0212   CL 106 09/27/2016 0431   CO2 25 09/28/2016 0212   CO2 23 09/27/2016 0431   GLUCOSE 99 09/28/2016 0212   GLUCOSE 169 (H) 09/27/2016 0431   BUN 8 09/28/2016 0212   BUN 10 09/27/2016 0431   CREATININE 0.63 09/28/2016 0212   CREATININE 0.65 09/27/2016 0431   CALCIUM 8.9 09/28/2016 0212   CALCIUM 8.4 (L) 09/27/2016 0431   GFRNONAA >60 09/28/2016 0212   GFRNONAA >60 09/27/2016  0431   GFRAA >60 09/28/2016 0212   GFRAA >60 09/27/2016 0431   CBC    Component Value Date/Time   WBC 10.1 09/28/2016 0212   RBC 4.19 (L) 09/28/2016 0212   HGB 11.6 (L) 09/28/2016 0212   HCT 36.6 (L) 09/28/2016 0212   PLT 222 09/28/2016 0212   MCV 87.4 09/28/2016 0212   MCH 27.7 09/28/2016 0212   MCHC 31.7 09/28/2016 0212   RDW 14.4 09/28/2016 0212   HEPATIC Function Panel No results for input(s): PROT in the last 8760 hours.  Invalid input(s):  ALBUMIN,  AST,  ALT,  ALKPHOS,  BILIDIR,  IBILI HEMOGLOBIN A1C No components found for: HGA1C,  MPG CARDIAC ENZYMES Lab Results  Component Value Date   TROPONINI 0.11 (HH) 09/27/2016   TROPONINI 0.17 (HH) 09/27/2016   TROPONINI 0.30 (HH) 09/26/2016   BNP No results for input(s): PROBNP in the last 8760 hours. TSH  Recent Labs  09/27/16 0431  TSH 2.491   CHOLESTEROL  Recent Labs  09/27/16 0432  CHOL 179    Scheduled Meds: . aspirin EC  81 mg Oral Daily  . atorvastatin  40 mg Oral q1800  . Chlorhexidine Gluconate Cloth  6 each Topical Q0600  . docusate sodium  100 mg Oral BID  . mupirocin ointment  1 application Nasal BID  . potassium chloride  10 mEq Oral TID  .  sodium chloride flush  3 mL Intravenous Q12H  . sodium chloride flush  3 mL Intravenous Q12H   Continuous Infusions: . sodium chloride 30 mL/hr at 09/27/16 2200  . sodium chloride    . sodium chloride 50 mL/hr at 09/28/16 1030  . heparin 2,600 Units/hr (09/28/16 0731)   PRN Meds:.sodium chloride, acetaminophen **OR** acetaminophen, albuterol, ondansetron **OR** ondansetron (ZOFRAN) IV, sodium chloride flush  Assessment/Plan: Acute pulmonary embolism Morbid obesity H/O tachy-brady syndrome Sleep apnea Dyslipidemia Abnormal troponin-I Hypokalemia-resolved  Cardiac cath today. EP consult for syncope   LOS: 2 days    Orpah Cobb  MD  09/28/2016, 10:25 AM

## 2016-09-28 NOTE — Progress Notes (Signed)
09/28/2016 6:11 PM Verbal order Dr. Algie Coffer ok to restart Heparin Gtt 8 hours post beginning of Bedrest. Pharmacy contacted and updated as well. Will continue to closely monitor patient.  Modean Mccullum, Blanchard Kelch

## 2016-09-28 NOTE — Progress Notes (Signed)
ANTICOAGULATION CONSULT NOTE - Follow Up Consult  Pharmacy Consult for heparin Indication: PE and r/o MI  Labs:  Recent Labs  09/26/16 2359  09/27/16 0431 09/27/16 1128 09/27/16 1816 09/28/16 0212  HGB  --   --  11.1*  --   --  11.6*  HCT  --   --  35.0*  --   --  36.6*  PLT  --   --  198  --   --  222  LABPROT  --   --  15.9*  --   --   --   INR  --   --  1.26  --   --   --   HEPARINUNFRC  --   < > <0.10* 0.10* 0.17* 0.36  CREATININE  --   --  0.65  --   --   --   TROPONINI 0.30*  --  0.17* 0.11*  --   --   < > = values in this interval not displayed.   Assessment: 51yo male now therapeutic on heparin after rate increases though at lower end of goal and would prefer higher level given PE.  Goal of Therapy:  Heparin level 0.3-0.7 units/ml   Plan:  Will increase heparin gtt by 10% to 2600 units/hr and check level in 6hr.  Vernard Gambles, PharmD, BCPS  09/28/2016,3:23 AM

## 2016-09-28 NOTE — Progress Notes (Signed)
Site area: RFA Site Prior to Removal:  Level  Risk analyst For:25 min Manual:   yes Patient Status During Pull:  stable Post Pull Site:  Level 0 Post Pull Instructions Given:  yes Post Pull Pulses Present: palpable Dressing Applied: tegaderm  Bedrest begins @ 1735 till 2135 Comments:

## 2016-09-28 NOTE — Interval H&P Note (Signed)
History and Physical Interval Note:  09/28/2016 3:58 PM  Timothy Thomas  has presented today for surgery, with the diagnosis of syncope   The various methods of treatment have been discussed with the patient and family. After consideration of risks, benefits and other options for treatment, the patient has consented to  Procedure(s): Left Heart Cath and Coronary Angiography (N/A) as a surgical intervention .  The patient's history has been reviewed, patient examined, no change in status, stable for surgery.  I have reviewed the patient's chart and labs.  Questions were answered to the patient's satisfaction.     Biff Rutigliano S

## 2016-09-28 NOTE — Progress Notes (Signed)
09/28/2016 1500 Pt. Continues to c/o left hip pain which pt. States is chronic. No pain relief from PRN tylenol already administered to pt. Dr. Algie Coffer paged and made aware. Verbal order received for oxycodone 5mg  PO x1. Orders enacted. Will continue to closely monitor patient.  Ezra Marquess, Blanchard Kelch

## 2016-09-28 NOTE — Care Management Note (Signed)
Case Management Note  Patient Details  Name: Timothy Thomas MRN: [id number] Date of Birth: 12-29-1965  Subjective/Objective:  Pt admitted post syncopal event                  Action/Plan:   PTA independent from home with wife.  Pt states he doesn't have insurance nor PCP.  CM offered free clinics in [place] - placed on AVS.  Pt states he will gladly drive to [place] to go to [facility].  CM contacted [facility] and clinic has agreed to allow pt to set up PCP and help pt with medication assistance even though he lives in [place].     Expected Discharge Date:                  Expected Discharge Plan:  Home/Self Care  In-House Referral:     Discharge planning Services  CM Consult, Indigent Health Clinic, Medication Assistance  Post Acute Care Choice:    Choice offered to:     DME Arranged:    DME Agency:     HH Arranged:    HH Agency:     Status of Service:     If discussed at [employer], dates discussed:    Additional Comments:  Cherylann Parr, RN 09/28/2016, 3:59 PM

## 2016-09-28 NOTE — Progress Notes (Addendum)
ANTICOAGULATION CONSULT NOTE - Follow-up Consult  Pharmacy Consult for heparin Indication: PE  Allergies  Allergen Reactions  . Codeine Nausea And Vomiting    Low tolerance to narcotics  . Ketorolac Nausea And Vomiting    Vomiting      Patient Measurements: Height: 5\' 10"  (177.8 cm) Weight: (!) 356 lb 11.2 oz (161.8 kg) IBW/kg (Calculated) : 73 Heparin Dosing Weight: 115kg  Vital Signs: Temp: 97.7 F (36.5 C) (04/23 1347) Temp Source: Oral (04/23 1347) BP: 106/53 (04/23 1730) Pulse Rate: 46 (04/23 1730)  Labs:  Recent Labs  09/26/16 2359  09/27/16 0431 09/27/16 1128 09/27/16 1816 09/28/16 0212 09/28/16 0857  HGB  --   --  11.1*  --   --  11.6*  --   HCT  --   --  35.0*  --   --  36.6*  --   PLT  --   --  198  --   --  222  --   LABPROT  --   --  15.9*  --   --   --   --   INR  --   --  1.26  --   --   --   --   HEPARINUNFRC  --   < > <0.10* 0.10* 0.17* 0.36 0.38  CREATININE  --   --  0.65  --   --  0.63  --   TROPONINI 0.30*  --  0.17* 0.11*  --   --   --   < > = values in this interval not displayed.   Assessment: 51yo male c/o syncopal event, presented to OSH where CT revealed RUL PE. He has been on heparin and now post cath for medical management only. Spoke with Dr. Algie Coffer and plans are to continue heparin post-cath and begin Xarelto on 4/24. Sheath removed at about 5:30pm -Last heparin infusion rate was 2600 units/hr and heparin level= 0.38   Goal of Therapy:  Heparin level 0.3-0.7 units/ml Monitor platelets by anticoagulation protocol: Yes   Plan:  -Restart heparin at 1:30am on 4/24 (8 hours post sheath removal) -Xarelto 15mg  po bid to begin 4/24 then change to 20mg  po daily after 21days -heparin level and CBC in am  Harland German, Pharm D 09/28/2016 6:33 PM

## 2016-09-28 NOTE — H&P (View-Only) (Signed)
Ref: No primary care provider on file.   Subjective:  Feeling better. HR in 40's when sleeping and 50's + when sitting. Afebrile. Mild LVH and 45 % EF on echocardiogram. [place] of lower extremities is negative for DVT.   Objective:  Vital Signs in the last 24 hours: Temp:  [97.6 F (36.4 C)-98.7 F (37.1 C)] 98.7 F (37.1 C) (04/23 0834) Pulse Rate:  [43-73] 43 (04/23 0834) Cardiac Rhythm: Sinus bradycardia (04/23 0800) Resp:  [11-27] 11 (04/23 0834) BP: (94-127)/(52-95) 102/67 (04/23 0834) SpO2:  [96 %-100 %] 99 % (04/23 0834) Weight:  [161.8 kg (356 lb 11.2 oz)] 161.8 kg (356 lb 11.2 oz) (04/23 0500)  Physical Exam: BP Readings from Last 1 Encounters:  09/28/16 102/67    Wt Readings from Last 1 Encounters:  09/28/16 (!) 161.8 kg (356 lb 11.2 oz)    Weight change: -0.907 kg (-2 lb) Body mass index is 51.18 kg/m. HEENT: [place]/AT, Eyes- PERL, EOMI, Conjunctiva-Pink, Sclera-Non-icteric Neck: No JVD, No bruit, Trachea midline. Lungs:  Clear, Bilateral. Cardiac:  Regular rhythm, normal S1 and S2, no S3. II/VI systolic murmur. Abdomen:  Soft, non-tender. BS present. Extremities:  No edema present. No cyanosis. No clubbing. CNS: AxOx3, Cranial nerves grossly intact, moves all 4 extremities.  Skin: Warm and dry.   Intake/Output from previous day: 04/22 0701 - 04/23 0700 In: 2546 [P.O.:1060; I.V.:1486] Out: 2150 [Urine:2150]    Lab Results: BMET    Component Value Date/Time   NA 139 09/28/2016 0212   NA 137 09/27/2016 0431   K 3.8 09/28/2016 0212   K 3.2 (L) 09/27/2016 0431   CL 106 09/28/2016 0212   CL 106 09/27/2016 0431   CO2 25 09/28/2016 0212   CO2 23 09/27/2016 0431   GLUCOSE 99 09/28/2016 0212   GLUCOSE 169 (H) 09/27/2016 0431   BUN 8 09/28/2016 0212   BUN 10 09/27/2016 0431   CREATININE 0.63 09/28/2016 0212   CREATININE 0.65 09/27/2016 0431   CALCIUM 8.9 09/28/2016 0212   CALCIUM 8.4 (L) 09/27/2016 0431   GFRNONAA >60 09/28/2016 0212   GFRNONAA >60 09/27/2016  0431   GFRAA >60 09/28/2016 0212   GFRAA >60 09/27/2016 0431   CBC    Component Value Date/Time   WBC 10.1 09/28/2016 0212   RBC 4.19 (L) 09/28/2016 0212   HGB 11.6 (L) 09/28/2016 0212   HCT 36.6 (L) 09/28/2016 0212   PLT 222 09/28/2016 0212   MCV 87.4 09/28/2016 0212   MCH 27.7 09/28/2016 0212   MCHC 31.7 09/28/2016 0212   RDW 14.4 09/28/2016 0212   HEPATIC Function Panel No results for input(s): PROT in the last 8760 hours.  Invalid input(s):  ALBUMIN,  AST,  ALT,  ALKPHOS,  BILIDIR,  IBILI HEMOGLOBIN A1C No components found for: HGA1C,  MPG CARDIAC ENZYMES Lab Results  Component Value Date   TROPONINI 0.11 (HH) 09/27/2016   TROPONINI 0.17 (HH) 09/27/2016   TROPONINI 0.30 (HH) 09/26/2016   BNP No results for input(s): PROBNP in the last 8760 hours. TSH  Recent Labs  09/27/16 0431  TSH 2.491   CHOLESTEROL  Recent Labs  09/27/16 0432  CHOL 179    Scheduled Meds: . aspirin EC  81 mg Oral Daily  . atorvastatin  40 mg Oral q1800  . Chlorhexidine Gluconate Cloth  6 each Topical Q0600  . docusate sodium  100 mg Oral BID  . mupirocin ointment  1 application Nasal BID  . potassium chloride  10 mEq Oral TID  .  sodium chloride flush  3 mL Intravenous Q12H  . sodium chloride flush  3 mL Intravenous Q12H   Continuous Infusions: . sodium chloride 30 mL/hr at 09/27/16 2200  . sodium chloride    . sodium chloride 50 mL/hr at 09/28/16 1030  . heparin 2,600 Units/hr (09/28/16 0731)   PRN Meds:.sodium chloride, acetaminophen **OR** acetaminophen, albuterol, ondansetron **OR** ondansetron (ZOFRAN) IV, sodium chloride flush  Assessment/Plan: Acute pulmonary embolism Morbid obesity H/O tachy-brady syndrome Sleep apnea Dyslipidemia Abnormal troponin-I Hypokalemia-resolved  Cardiac cath today. EP consult for syncope   LOS: 2 days    Orpah Cobb  MD  09/28/2016, 10:25 AM

## 2016-09-29 ENCOUNTER — Encounter (HOSPITAL_COMMUNITY): Payer: Self-pay | Admitting: Cardiovascular Disease

## 2016-09-29 LAB — CBC
HEMATOCRIT: 34 % — AB (ref 39.0–52.0)
HEMOGLOBIN: 10.5 g/dL — AB (ref 13.0–17.0)
MCH: 27 pg (ref 26.0–34.0)
MCHC: 30.9 g/dL (ref 30.0–36.0)
MCV: 87.4 fL (ref 78.0–100.0)
Platelets: 223 10*3/uL (ref 150–400)
RBC: 3.89 MIL/uL — AB (ref 4.22–5.81)
RDW: 14.3 % (ref 11.5–15.5)
WBC: 9 10*3/uL (ref 4.0–10.5)

## 2016-09-29 LAB — BASIC METABOLIC PANEL
ANION GAP: 8 (ref 5–15)
BUN: 9 mg/dL (ref 6–20)
CALCIUM: 8.7 mg/dL — AB (ref 8.9–10.3)
CHLORIDE: 107 mmol/L (ref 101–111)
CO2: 25 mmol/L (ref 22–32)
Creatinine, Ser: 0.71 mg/dL (ref 0.61–1.24)
GFR calc non Af Amer: >60 mL/min (ref >60)
GLUCOSE: 101 mg/dL — AB (ref 65–99)
POTASSIUM: 3.8 mmol/L (ref 3.5–5.1)
Sodium: 140 mmol/L (ref 135–145)

## 2016-09-29 MED ORDER — RIVAROXABAN 20 MG PO TABS: 20.0000 mg | ORAL_TABLET | Freq: Every day | ORAL | 6 refills | Status: DC

## 2016-09-29 MED ORDER — MUPIROCIN 2 % EX OINT: 1.0000 "application " | TOPICAL_OINTMENT | Freq: Two times a day (BID) | CUTANEOUS | 0 refills | Status: DC

## 2016-09-29 MED ORDER — POTASSIUM CHLORIDE ER 10 MEQ PO TBCR: 10.0000 meq | EXTENDED_RELEASE_TABLET | Freq: Every day | ORAL | 3 refills | Status: DC

## 2016-09-29 MED ORDER — ATORVASTATIN CALCIUM 40 MG PO TABS: 40.0000 mg | ORAL_TABLET | Freq: Every day | ORAL | 3 refills | Status: DC

## 2016-09-29 MED FILL — Heparin Sodium (Porcine) 100 Unt/ML in Sodium Chloride 0.45%: INTRAMUSCULAR | Qty: 250 | Status: AC

## 2016-09-29 MED FILL — XARELTO STARTER PACK: 15 & 20 | 30 days supply | Qty: 51 | Fill #0

## 2016-09-29 NOTE — Care Management Note (Addendum)
Case Management Note  Patient Details  Name: Timothy Thomas MRN: [id number] Date of Birth: [birth year]  Subjective/Objective:  Pt admitted post syncopal event                  Action/Plan:   PTA independent from home with wife.  Pt states he doesn't have insurance nor PCP.  CM offered free clinics in [place] - placed on AVS.  Pt states he will gladly drive to [place] to go to [facility].  CM contacted [facility] and clinic has agreed to allow pt to set up PCP and help pt with medication assistance even though he lives in [place].     Expected Discharge Date:                  Expected Discharge Plan:  Home/Self Care  In-House Referral:     Discharge planning Services  CM Consult, Indigent Health Clinic, Medication Assistance  Post Acute Care Choice:    Choice offered to:     DME Arranged:    DME Agency:     HH Arranged:    HH Agency:     Status of Service:     If discussed at [employer], dates discussed:    Additional Comments: 09/29/2016 CM provided free 30 day card to pt for Xarelto.  CM reiterated that pt needs to go to appt set up for this Friday and request medication assistance to ensure he can get refills post initial 30 days.   Cm contacted pharmacy of choice [facility] in [place] on [address] - pharmacy can filll prescription.  Pt confirmed that he already has working CPAP in the home Cherylann Parr, [place] 09/29/2016, 10:17 AM

## 2016-09-29 NOTE — Discharge Summary (Signed)
Physician Discharge Summary  Patient ID: Timothy Thomas MRN: [id number] DOB/AGE: 1966/04/14 51 y.o.  Admit date: 09/26/2016 Discharge date: 09/29/2016  Admission Diagnoses: Acute pulmonary embolism Morbid obesity H/O tachy-brady syndrome Sleep apnea Dyslipidemia Abnormal troponin-I  Discharge Diagnoses:  Principal Problem:   Acute pulmonary embolism (HCC) Active Problems:   Syncope   Morbid obesity (HCC)   CAD, multi-vessel   Sinus bradycardia   Lower extremities DVT ruled out   Sleep apnea   Dyslipidemia  Discharged Condition: fair  Hospital Course: 51 year old male was transferred from [facility] heath care for syncope and pulmonary embolus along with bradycardia and EKG changes of ischemia. 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 He was treated with heparin followed by Xarelto. He also underwent cardiac catheterization showing mild multivessel CAD. He remained asymptomatic in hospital and do not want pacemaker as his syncope is more likely from PE.   Consults: cardiology  Significant Diagnostic Studies: labs: Near normal CBC with mildly elevated WBC count and Hgb of 11.1. Normal BMET except mild hypokalemia, improving with oral potassium intake. Normal TSH. Mildly elevated LDL cholesterol and low HDL cholesterol. Mildly elevated Troponin-I from demand ischemia.  EKG-Sinus bradycardia,low voltage.  Cardiac cath: Mild multivessel CAD  CT chest at referring facility: Positive for pulmonary embolism.  [place] lower ext. venous duplex negative for DVT.  Treatments: cardiac meds: Atorvastatin and Xarelto.  Discharge Exam: Blood pressure 121/68, pulse (!) 52, temperature 97.8 F (36.6 C), temperature source Oral, resp. rate 19, height 5\' 10"  (1.778 m), weight (!) 162.4 kg (358 lb), SpO2 98 %. General appearance: alert, cooperative and appears stated age. Morbid obesity. Head: Normocephalic,  atraumatic. Eyes: Hazel eyes, pink conjunctiva, corneas clear. PERRL, EOM's intact.  Neck: No adenopathy, no carotid bruit, no JVD, supple, symmetrical, trachea midline and thyroid not enlarged. Resp: Clear to auscultation bilaterally. Cardio: Regular rate and rhythm, S1, S2 normal, II/VI systolic murmur, no click, rub or gallop. GI: Soft, non-tender; bowel sounds normal; no organomegaly. Extremities: No edema, cyanosis or clubbing. No right groin hematoma. Skin: Warm and dry.  Neurologic: Alert and oriented X 3, normal strength and tone. Normal coordination and gait.  Disposition: 01, Home, self care   Allergies as of 09/29/2016      Reactions   Codeine Nausea And Vomiting   Low tolerance to narcotics   Ketorolac Nausea And Vomiting   Vomiting       Medication List    TAKE these medications   atorvastatin 40 MG tablet Commonly known as:  LIPITOR Take 1 tablet (40 mg total) by mouth daily at 6 PM.   diphenhydramine-acetaminophen 25-500 MG Tabs tablet Commonly known as:  TYLENOL PM Take 3 tablets by mouth at bedtime.   mupirocin ointment 2 % Commonly known as:  BACTROBAN Place 1 application into the nose 2 (two) times daily.   potassium chloride 10 MEQ tablet Commonly known as:  K-DUR Take 1 tablet (10 mEq total) by mouth daily.   rivaroxaban 20 MG Tabs tablet Commonly known as:  XARELTO Take 1 tablet (20 mg total) by mouth daily with supper. Start taking on:  10/20/2016      Follow-up Information     [facility]. Go on 10/02/2016.   Why:  at 1030.  Please establish Primary Care physician with doctor and request medicaiton assistance during this visit Contact information: [address] [place] [address]  [employer]. Call.   Why:  [phone] Contact information: [facility] [id number], [place], [place] [address]       Ricki Rodriguez, MD. Schedule an appointment as soon as possible for a visit in 1  month(s).   Specialty:  Cardiology Contact information: [address] [place] [address] [phone]           Signed: Ricki Rodriguez 09/29/2016, 1:30 PM

## 2016-09-29 NOTE — Discharge Instructions (Addendum)
Information on my medicine - XARELTO (rivaroxaban)  This medication education was reviewed with me or my healthcare representative as part of my discharge preparation.   WHY WAS XARELTO PRESCRIBED FOR YOU? Xarelto was prescribed to treat blood clots that may have been found in the veins of your legs (deep vein thrombosis) or in your lungs (pulmonary embolism) and to reduce the risk of them occurring again.  What do you need to know about Xarelto? The starting dose is one 15 mg tablet taken TWICE daily with food for the FIRST 21 DAYS then on 10/20/2016  the dose is changed to one 20 mg tablet taken ONCE A DAY with your evening meal.  DO NOT stop taking Xarelto without talking to the health care provider who prescribed the medication.  Refill your prescription for 20 mg tablets before you run out.  After discharge, you should have regular check-up appointments with your healthcare provider that is prescribing your Xarelto.  In the future your dose may need to be changed if your kidney function changes by a significant amount.  What do you do if you miss a dose? If you are taking Xarelto TWICE DAILY and you miss a dose, take it as soon as you remember. You may take two 15 mg tablets (total 30 mg) at the same time then resume your regularly scheduled 15 mg twice daily the next day.  If you are taking Xarelto ONCE DAILY and you miss a dose, take it as soon as you remember on the same day then continue your regularly scheduled once daily regimen the next day. Do not take two doses of Xarelto at the same time.   Important Safety Information Xarelto is a blood thinner medicine that can cause bleeding. You should call your healthcare provider right away if you experience any of the following: ? Bleeding from an injury or your nose that does not stop. ? Unusual colored urine (red or dark brown) or unusual colored stools (red or black). ? Unusual bruising for unknown reasons. ? A serious fall or  if you hit your head (even if there is no bleeding).  Some medicines may interact with Xarelto and might increase your risk of bleeding while on Xarelto. To help avoid this, consult your healthcare provider or pharmacist prior to using any new prescription or non-prescription medications, including herbals, vitamins, non-steroidal anti-inflammatory drugs (NSAIDs) and supplements.  This website has more information on Xarelto: VisitDestination.com.br.  Refrain from going back to work for at least 1 week post hospital discharge - per Dr. Algie Coffer.

## 2016-10-02 ENCOUNTER — Other Ambulatory Visit: Payer: Self-pay

## 2016-10-02 ENCOUNTER — Ambulatory Visit (INDEPENDENT_AMBULATORY_CARE_PROVIDER_SITE_OTHER): Payer: Self-pay | Admitting: Family Medicine

## 2016-10-02 ENCOUNTER — Encounter: Payer: Self-pay | Admitting: Family Medicine

## 2016-10-02 VITALS — BP 122/44 | HR 55 | Temp 97.6°F | Resp 20 | Ht 70.0 in | Wt 360.0 lb

## 2016-10-02 DIAGNOSIS — Z86711 Personal history of pulmonary embolism: Secondary | ICD-10-CM

## 2016-10-02 DIAGNOSIS — D509 Iron deficiency anemia, unspecified: Secondary | ICD-10-CM

## 2016-10-02 DIAGNOSIS — R7303 Prediabetes: Secondary | ICD-10-CM

## 2016-10-02 DIAGNOSIS — E785 Hyperlipidemia, unspecified: Secondary | ICD-10-CM

## 2016-10-02 DIAGNOSIS — E876 Hypokalemia: Secondary | ICD-10-CM

## 2016-10-02 DIAGNOSIS — Z1159 Encounter for screening for other viral diseases: Secondary | ICD-10-CM

## 2016-10-02 DIAGNOSIS — Z23 Encounter for immunization: Secondary | ICD-10-CM

## 2016-10-02 DIAGNOSIS — F17200 Nicotine dependence, unspecified, uncomplicated: Secondary | ICD-10-CM

## 2016-10-02 DIAGNOSIS — R55 Syncope and collapse: Secondary | ICD-10-CM

## 2016-10-02 LAB — POCT URINALYSIS DIP (DEVICE)
Bilirubin Urine: NEGATIVE
Glucose, UA: NEGATIVE mg/dL
Hgb urine dipstick: NEGATIVE
KETONES UR: NEGATIVE mg/dL
NITRITE: NEGATIVE
PH: 6.5 (ref 5.0–8.0)
PROTEIN: NEGATIVE mg/dL
Specific Gravity, Urine: 1.01 (ref 1.005–1.030)
UROBILINOGEN UA: 0.2 mg/dL (ref 0.0–1.0)

## 2016-10-02 MED ORDER — POTASSIUM CHLORIDE ER 10 MEQ PO TBCR: 10.0000 meq | EXTENDED_RELEASE_TABLET | Freq: Every day | ORAL | 3 refills | Status: DC

## 2016-10-02 MED ORDER — RIVAROXABAN 20 MG PO TABS: 20.0000 mg | ORAL_TABLET | Freq: Every day | ORAL | 6 refills | Status: DC

## 2016-10-02 MED ORDER — FERROUS SULFATE 325 (65 FE) MG PO TABS: 325.0000 mg | ORAL_TABLET | Freq: Every day | ORAL | 2 refills | Status: DC

## 2016-10-02 NOTE — Telephone Encounter (Signed)
[facility]

## 2016-10-02 NOTE — Progress Notes (Signed)
Subjective:    Patient ID: Timothy Thomas, male    DOB: 08-02-1965, 51 y.o.   MRN: [id number]  HPI  Timothy Thomas, a 51 year old male with a history of COPD, morbid obesity, and pulmonary embolous presents accompanied by wife to establish care. Timothy Thomas says that he had a primary provider, but has been lost to follow up due to financial constraints. He was admitted to inpatient services on 09/26/2016 after passing out in his father's yard. He says that he was looking at 2 people mowing his father's grass and passed out for a few seconds. He says that he felt dizziness and disoriented. He was taken to the ER via EMS and found to have a submassive PE in the right lung. His troponin was also mildy elevated. 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 He has not been followed consistently in cardiology and the pace maker was shown to be non functioning.  Patient was discharged home on Xarelto. He has been taking 20 mg consistently. He denies chest pain, shortness of breath, signs of bleeding, lower extremity edema. He is a chronic everyday smoker. He smokes a little less than 1 pack per day. He is not ready to quit at this time.  Past Medical History:  Diagnosis Date  . Aphasia    Transient - negative head CT and EEG with neurology workup February 2017 [facility])  . COPD (chronic obstructive pulmonary disease) (HCC)   . Glaucoma   . Macular degeneration   . Obesity   . Obstructive sleep apnea   . Tachycardia-bradycardia syndrome (HCC)    Status post pacemaker  . Variant angina (HCC)    History of normal coronary arteries at cardiac catheterization 2002 - vasospasm noted  . Vasovagal syncope    Social History   Social History  . Marital status: Married    Spouse name: N/A  . Number of children: N/A  . Years of education: N/A   Occupational History  . Not on file.   Social History Main Topics  . Smoking status: Current Every Day Smoker   Packs/day: 1.00    Years: 40.00    Types: Cigarettes    Start date: 09/26/1976    Last attempt to quit: 09/26/2016  . Smokeless tobacco: Never Used  . Alcohol use No  . Drug use: No  . Sexual activity: Not on file   Other Topics Concern  . Not on file   Social History Narrative  . No narrative on file   Immunization History  Administered Date(s) Administered  . Pneumococcal Polysaccharide-23 10/02/2016   Review of Systems  Constitutional: Positive for unexpected weight change (weight gain).  Eyes: Negative.   Respiratory: Negative.   Cardiovascular: Positive for leg swelling. Negative for chest pain.  Gastrointestinal: Negative.   Endocrine: Negative.  Negative for cold intolerance, heat intolerance, polydipsia, polyphagia and polyuria.  Genitourinary: Negative.   Allergic/Immunologic: Negative.   Neurological: Negative.  Negative for weakness and numbness.  Hematological: Negative.        Objective:   Physical Exam  Constitutional: He is oriented to person, place, and time.  Morbid obesity   HENT:  Head: Normocephalic and atraumatic.  Right Ear: External ear normal.  Left Ear: External ear normal.  Nose: Nose normal.  Mouth/Throat: Oropharynx is clear and moist.  Eyes: Conjunctivae and EOM are normal. Pupils are equal, round, and reactive to light.  Neck: Normal range of motion.  Neck supple.  Cardiovascular: Normal rate, regular rhythm, normal heart sounds and intact distal pulses.   Pulmonary/Chest: Effort normal and breath sounds normal.  Abdominal: Soft. Bowel sounds are normal.  Increased abdominal girth  Musculoskeletal: Normal range of motion.  Neurological: He is alert and oriented to person, place, and time. He has normal reflexes.  Skin: Skin is warm and dry.  Psychiatric: He has a normal mood and affect. His behavior is normal. Judgment and thought content normal.        BP (!) 122/44 (BP Location: Right Arm, Patient Position: Sitting, Cuff Size:  Large)   Pulse (!) 55   Temp 97.6 F (36.4 C) (Oral)   Resp 20   Ht 5\' 10"  (1.778 m)   Wt (!) 360 lb (163.3 kg)   SpO2 98%   BMI 51.65 kg/m  Assessment & Plan:  1. Hx of pulmonary embolus  Continue Xarelto 20 mg daily for 6 months for unprovoked pulmonary embolous. Patient given samples of xarelto. 21 day supply.   2. Morbid obesity (HCC) Recommend a lowfat, low carbohydrate diet divided over 5-6 small meals, increase water intake to 6-8 glasses, and increase daily activity 3. Syncope, unspecified syncope type History of syncope. Patient has not had syncope since inpatient admission  4. Hyperlipidemia, unspecified hyperlipidemia type Will continue Atorvastatin 40 mg daily.   The patient is asked to make an attempt to improve diet and exercise patterns to aid in medical management of this problem.  5. Prediabetes Hemoglobin a1C is 5.9. Discussed diet at length.   6. Tobacco dependence Smoking cessation instruction/counseling given:  counseled patient on the dangers of tobacco use, advised patient to stop smoking, and reviewed strategies to maximize success 7. Need for hepatitis C screening test - Hepatitis C antibody - Pneumococcal polysaccharide vaccine 23-valent greater than or equal to 2yo subcutaneous/IM  8. Iron deficiency anemia, unspecified iron deficiency anemia type - ferrous sulfate 325 (65 FE) MG tablet; Take 1 tablet (325 mg total) by mouth daily with breakfast.  Dispense: 30 tablet; Refill: 2  9. Hypokalemia Will repeat BMP in 1 month.    [name]  MSN, FNP-C [facility] [address] [place], [place] [address] [phone]

## 2016-10-02 NOTE — Patient Instructions (Addendum)
Post hospital follow up to establish care:   Pulmonary Embolism:  Xarelto 20 mg for 6 months for a pulmonary embolism Watch for signs of bleeding (gums, urine, stools)!! No contact sports!  Prediabetes:   Recommend a lowfat, low carbohydrate diet divided over 5-6 small meals, increase water intake to 6-8 glasses, and 150 minutes per week of cardiovascular exercise.     Hypokalemia:   Will check potassium in 1 month     Pulmonary Embolism A pulmonary embolism (PE) is a sudden blockage or decrease of blood flow in one lung or both lungs. Most blockages come from a blood clot that travels from the legs or the pelvis to the lungs. PE is a dangerous and potentially life-threatening condition if it is not treated right away. What are the causes? A pulmonary embolism occurs most commonly when a blood clot travels from one of your veins to your lungs. Rarely, PE is caused by air, fat, amniotic fluid, or part of a tumor traveling through your veins to your lungs. What increases the risk? A PE is more likely to develop in:  People who smoke.  People who areolder, especially over 65 years of age.  People who are overweight (obese).  People who sit or lie still for a long time, such as during long-distance travel (over 4 hours), bed rest, hospitalization, or during recovery from certain medical conditions like a stroke.  People who do not engage in much physical activity (sedentary lifestyle).  People who have chronic breathing disorders.  People whohave a personal or family history of blood clots or blood clotting disease.  People whohave peripheral vascular disease (PVD), diabetes, or some types of cancer.  People who haveheart disease, especially if the person had a recent heart attack or has congestive heart failure.  People who have neurological diseases that affect the legs (leg paresis).  People who have had a traumatic injury, such as breaking a hip or leg.  People  whohave recently had major or lengthy surgery, especially on the hip, knee, or abdomen.  People who have hada central line placed inside a large vein.  People who takemedicines that contain the hormone estrogen. These include birth control pills and hormone replacement therapy.  Pregnancy or during childbirth or the postpartum period. What are the signs or symptoms? The symptoms of a PE usually start suddenly and include:  Shortness of breath while active or at rest.  Coughing or coughing up blood or blood-tinged mucus.  Chest pain that is often worse with deep breaths.  Rapid or irregular heartbeat.  Feeling light-headed or dizzy.  Fainting.  Feelinganxious.  Sweating. There may also be pain and swelling in a leg if that is where the blood clot started. These symptoms may represent a serious problem that is an emergency. Do not wait to see if the symptoms will go away. Get medical help right away. Call your local emergency services (911 in the [place] Do not drive yourself to the hospital.  How is this diagnosed? Your health care provider will take a medical history and perform a physical exam. You may also have other tests, including:  Blood tests to assess the clotting properties of your blood, assess oxygen levels in your blood, and find blood clots.  Imaging tests, such as CT, ultrasound, MRI, X-ray, and other tests to see if you have clots anywhere in your body.  An electrocardiogram (ECG) to look for heart strain from blood clots in the lungs. How is this treated? The  main goals of PE treatment are:  To stop a blood clot from growing larger.  To stop new blood clots from forming. The type of treatment that you receive depends on many factors, such as the cause of your PE, your risk for bleeding or developing more clots, and other medical conditions that you have. Sometimes, a combination of treatments is necessary. This condition may be treated with:  Medicines,  including newer oral blood thinners (anticoagulants), warfarin, low molecular weight heparins, thrombolytics, or heparins.  Wearing compression stockings or using different types of devices.  Surgery (rare) to remove the blood clot or to place a filter in your abdomen to stop the blood clot from traveling to your lungs. Treatments for a PE are often divided into immediate treatment, long-term treatment (up to 3 months after PE), and extended treatment (more than 3 months after PE). Your treatment may continue for several months. This is called maintenance therapy, and it is used to prevent the forming of new blood clots. You can work with your health care provider to choose the treatment program that is best for you. What are anticoagulants?  Anticoagulants are medicines that treat PEs. They can stop current blood clots from growing and stop new clots from forming. They cannot dissolve existing clots. Your body dissolves clots by itself over time. Anticoagulants are given by mouth, by injection, or through an IV tube. What are thrombolytics?  Thrombolytics are clot-dissolving medicines that are used to dissolve a PE. They carry a high risk of bleeding, so they tend to be used only in severe cases or if you have very low blood pressure. Follow these instructions at home: If you are taking a newer oral anticoagulant:   Take the medicine every single day at the same time each day.  Understand what foods and drugs interact with this medicine.  Understand that there are no regular blood tests required when using this medicine.  Understandthe side effects of this medicine, including excessive bruising or bleeding. Ask your health care provider or pharmacist about other possible side effects. If you are taking warfarin:   Understand how to take warfarin and know which foods can affect how warfarin works in [employer] account [employer].  Understand that it is dangerous to taketoo much or too little warfarin. Too much  warfarin increases the risk of bleeding. Too little warfarin continues to allow the risk for blood clots.  Follow your PT and INR blood testing schedule. The PT and INR results allow your health care provider to adjust your dose of warfarin. It is very important that you have your PT and INR tested as often as told by your health care provider.  Avoid major changes in your diet, or tell your health care provider before you change your diet. Arrange a visit with a registered dietitian to answer your questions. Many foods, especially foods that are high in vitamin K, can interfere with warfarin and affect the PT and INR results. Eat a consistent amount of foods that are high in vitamin K, such as:  Spinach, kale, broccoli, cabbage, collard greens, turnip greens, Brussels sprouts, peas, cauliflower, seaweed, and parsley.  Beef liver and pork liver.  Green tea.  Soybean oil.  Tell your health care provider about any and all medicines, vitamins, and supplements that you take, including aspirin and other over-the-counter anti-inflammatory medicines. Be especially cautious with aspirin and anti-inflammatory medicines. Do not take those before you ask your health care provider if it is safe to do so.  This is important because many medicines can interfere with warfarin and affect the PT and INR results.  Do not start or stop taking any over-the-counter or prescription medicine unless your health care provider or pharmacist tells you to do so. If you take warfarin, you will also need to do these things:  Hold pressure over cuts for longer than usual.  Tell your dentist and other health care providers that you are taking warfarin before you have any procedures in which bleeding may occur.  Avoid alcohol or drink very small amounts. Tell your health care provider if you change your alcohol intake.  Do not use tobacco products, including cigarettes, chewing tobacco, and e-cigarettes. If you need help  quitting, ask your health care provider.  Avoid contact sports. General instructions   Take over-the-counter and prescription medicines only as told by your health care provider. Anticoagulant medicines can have side effects, including easy bruising and difficulty stopping bleeding. If you are prescribed an anticoagulant, you will also need to do these things:  Hold pressure over cuts for longer than usual.  Tell your dentist and other health care providers that you are taking anticoagulants before you have any procedures in which bleeding may occur.  Avoid contact sports.  Wear a medical alert bracelet or carry a medical alert card that says you have had a PE.  Ask your health care provider how soon you can go back to your normal activities. Stay active to prevent new blood clots from forming.  Make sure to exercise while traveling or when you have been sitting or standing for a long period of time. It is very important to exercise. Exercise your legs by walking or by tightening and relaxing your leg muscles often. Take frequent walks.  Wear compression stockings as told by your health care provider to help prevent more blood clots from forming.  Do not use tobacco products, including cigarettes, chewing tobacco, and e-cigarettes. If you need help quitting, ask your health care provider.  Keep all follow-up appointments with your health care provider. This is important. How is this prevented? Take these actions to decrease your risk of developing another PE:  Exercise regularly. For at least 30 minutes every day, engage in:  Activity that involves moving your arms and legs.  Activity that encourages good blood flow through your body by increasing your heart rate.  Exercise your arms and legs every hour during long-distance travel (over 4 hours). Drink plenty of water and avoid drinking alcohol while traveling.  Avoid sitting or lying in bed for long periods of time without moving  your legs.  Maintain a weight that is appropriate for your height. Ask your health care provider what weight is healthy for you.  If you are a woman who is over 86 years of age, avoid unnecessary use of medicines that contain estrogen. These include birth control pills.  Do not smoke, especially if you take estrogen medicines. If you need help quitting, ask your health care provider.  If you are at very high risk for PE, wear compression stockings.  If you recently had a PE, have regularly scheduled ultrasound testing on your legs to check for new blood clots. If you are hospitalized, prevention measures may include:  Early walking after surgery, as soon as your health care provider says that it is safe.  Receiving anticoagulants to prevent blood clots. If you cannot take anticoagulants, other options may be available, such as wearing compression stockings or using different types of  devices. Get help right away if:  You have new or increased pain, swelling, or redness in an arm or leg.  You have numbness or tingling in an arm or leg.  You have shortness of breath while active or at rest.  You have chest pain.  You have a rapid or irregular heartbeat.  You feel light-headed or dizzy.  You cough up blood.  You notice blood in your vomit, bowel movement, or urine.  You have a fever. These symptoms may represent a serious problem that is an emergency. Do not wait to see if the symptoms will go away. Get medical help right away. Call your local emergency services (911 in the [place] Do not drive yourself to the hospital. This information is not intended to replace advice given to you by your health care provider. Make sure you discuss any questions you have with your health care provider. Document Released: 05/22/2000 Document Revised: 10/31/2015 Document Reviewed: 09/19/2014 Elsevier Interactive Patient Education  2017 [employer]  Obesity, Adult Obesity is having too much  body fat. If you have a BMI of 30 or more, you are obese. BMI is a number that explains how much body fat you have. Obesity is often caused by taking in (consuming) more calories than your body uses. Obesity can cause serious health problems. Changing your lifestyle can help to treat obesity. Follow these instructions at home: Eating and drinking    Follow advice from your doctor about what to eat and drink. Your doctor may tell you to:  Cut down on (limit) fast foods, sweets, and processed snack foods.  Choose low-fat options. For example, choose low-fat milk instead of whole milk.  Eat 5 or more servings of fruits or vegetables every day.  Eat at home more often. This gives you more control over what you eat.  Choose healthy foods when you eat out.  Learn what a healthy portion size is. A portion size is the amount of a certain food that is healthy for you to eat at one time. This is different for each person.  Keep low-fat snacks available.  Avoid sugary drinks. These include soda, fruit juice, iced tea that is sweetened with sugar, and flavored milk.  Eat a healthy breakfast.  Drink enough water to keep your pee (urine) clear or pale yellow.  Do not go without eating for long periods of time (do not fast).  Do not go on popular or trendy diets (fad diets). Physical Activity   Exercise often, as told by your doctor. Ask your doctor:  What types of exercise are safe for you.  How often you should exercise.  Warm up and stretch before being active.  Do slow stretching after being active (cool down).  Rest between times of being active. Lifestyle   Limit how much time you spend in front of your TV, computer, or video game system (be less sedentary).  Find ways to reward yourself that do not involve food.  Limit alcohol intake to no more than 1 drink a day for nonpregnant women and 2 drinks a day for men. One drink equals 12 oz of beer, 5 oz of wine, or 1 oz of hard  liquor. General instructions   Keep a weight loss journal. This can help you keep track of:  The food that you eat.  The exercise that you do.  Take over-the-counter and prescription medicines only as told by your doctor.  Take vitamins and supplements only as told by your  doctor.  Think about joining a support group. Your doctor may be able to help with this.  Keep all follow-up visits as told by your doctor. This is important. Contact a doctor if:  You cannot meet your weight loss goal after you have changed your diet and lifestyle for 6 weeks. This information is not intended to replace advice given to you by your health care provider. Make sure you discuss any questions you have with your health care provider. Document Released: 08/17/2011 Document Revised: 10/31/2015 Document Reviewed: 03/13/2015 Elsevier Interactive Patient Education  2017 [employer]  Hypokalemia Hypokalemia means that the amount of potassium in the blood is lower than normal.Potassium is a chemical that helps regulate the amount of fluid in the body (electrolyte). It also stimulates muscle tightening (contraction) and helps nerves work properly.Normally, most of the body's potassium is inside of cells, and only a very small amount is in the blood. Because the amount in the blood is so small, minor changes to potassium levels in the blood can be life-threatening. What are the causes? This condition may be caused by:  Antibiotic medicine.  Diarrhea or vomiting. Taking too much of a medicine that helps you have a bowel movement (laxative) can cause diarrhea and lead to hypokalemia.  Chronic kidney disease (CKD).  Medicines that help the body get rid of excess fluid (diuretics).  Eating disorders, such as bulimia.  Low magnesium levels in the body.  Sweating a lot. What are the signs or symptoms? Symptoms of this condition include:  Weakness.  Constipation.  Fatigue.  Muscle  cramps.  Mental confusion.  Skipped heartbeats or irregular heartbeat (palpitations).  Tingling or numbness. How is this diagnosed? This condition is diagnosed with a blood test. How is this treated? Hypokalemia can be treated by taking potassium supplements by mouth or adjusting the medicines that you take. Treatment may also include eating more foods that contain a lot of potassium. If your potassium level is very low, you may need to get potassium through an IV tube in one of your veins and be monitored in the hospital. Follow these instructions at home:  Take over-the-counter and prescription medicines only as told by your health care provider. This includes vitamins and supplements.  Eat a healthy diet. A healthy diet includes fresh fruits and vegetables, whole grains, healthy fats, and lean proteins.  If instructed, eat more foods that contain a lot of potassium, such as:  Nuts, such as peanuts and pistachios.  Seeds, such as sunflower seeds and pumpkin seeds.  Peas, lentils, and lima beans.  Whole grain and bran cereals and breads.  Fresh fruits and vegetables, such as apricots, avocado, bananas, cantaloupe, kiwi, oranges, tomatoes, asparagus, and potatoes.  Orange juice.  Tomato juice.  Red meats.  Yogurt.  Keep all follow-up visits as told by your health care provider. This is important. Contact a health care provider if:  You have weakness that gets worse.  You feel your heart pounding or racing.  You vomit.  You have diarrhea.  You have diabetes (diabetes mellitus) and you have trouble keeping your blood sugar (glucose) in your target range. Get help right away if:  You have chest pain.  You have shortness of breath.  You have vomiting or diarrhea that lasts for more than 2 days.  You faint. This information is not intended to replace advice given to you by your health care provider. Make sure you discuss any questions you have with your health  care provider.  Document Released: 05/25/2005 Document Revised: 01/11/2016 Document Reviewed: 01/11/2016 Elsevier Interactive Patient Education  2017 [employer] Rivaroxaban oral tablets What is this medicine? RIVAROXABAN (ri va ROX a ban) is an anticoagulant (blood thinner). It is used to treat blood clots in the lungs or in the veins. It is also used after knee or hip surgeries to prevent blood clots. It is also used to lower the chance of stroke in people with a medical condition called atrial fibrillation. This medicine may be used for other purposes; ask your health care provider or pharmacist if you have questions. COMMON BRAND NAME(S): Xarelto, Xarelto Starter Pack What should I tell my health care provider before I take this medicine? They need to know if you have any of these conditions: -bleeding disorders -bleeding in the brain -blood in your stools (black or tarry stools) or if you have blood in your vomit -history of stomach bleeding -kidney disease -liver disease -low blood counts, like low white cell, platelet, or red cell counts -recent or planned spinal or epidural procedure -take medicines that treat or prevent blood clots -an unusual or allergic reaction to rivaroxaban, other medicines, foods, dyes, or preservatives -pregnant or trying to get pregnant -breast-feeding How should I use this medicine? Take this medicine by mouth with a glass of water. Follow the directions on the prescription label. Take your medicine at regular intervals. Do not take it more often than directed. Do not stop taking except on your doctor's advice. Stopping this medicine may increase your risk of a blood clot. Be sure to refill your prescription before you run out of medicine. If you are taking this medicine after hip or knee replacement surgery, take it with or without food. If you are taking this medicine for atrial fibrillation, take it with your evening meal. If you are taking this  medicine to treat blood clots, take it with food at the same time each day. If you are unable to swallow your tablet, you may crush the tablet and mix it in applesauce. Then, immediately eat the applesauce. You should eat more food right after you eat the applesauce containing the crushed tablet. Talk to your pediatrician regarding the use of this medicine in children. Special care may be needed. Overdosage: If you think you have taken too much of this medicine contact a poison control center or emergency room at once. NOTE: This medicine is only for you. Do not share this medicine with others. What if I miss a dose? If you take your medicine once a day and miss a dose, take the missed dose as soon as you remember. If you take your medicine twice a day and miss a dose, take the missed dose immediately. In this instance, 2 tablets may be taken at the same time. The next day you should take 1 tablet twice a day as directed. What may interact with this medicine? Do not take this medicine with any of the following medications: -defibrotide This medicine may also interact with the following medications: -aspirin and aspirin-like medicines -certain antibiotics like erythromycin, azithromycin, and clarithromycin -certain medicines for fungal infections like ketoconazole and itraconazole -certain medicines for irregular heart beat like amiodarone, quinidine, dronedarone -certain medicines for seizures like carbamazepine, phenytoin -certain medicines that treat or prevent blood clots like warfarin, enoxaparin, and dalteparin -conivaptan -diltiazem -felodipine -indinavir -lopinavir; ritonavir -NSAIDS, medicines for pain and inflammation, like ibuprofen or naproxen -ranolazine -rifampin -ritonavir -SNRIs, medicines for depression, like desvenlafaxine, duloxetine, levomilnacipran, venlafaxine -SSRIs, medicines for depression,  like citalopram, escitalopram, fluoxetine, fluvoxamine, paroxetine,  sertraline -St. John's wort -verapamil This list may not describe all possible interactions. Give your health care provider a list of all the medicines, herbs, non-prescription drugs, or dietary supplements you use. Also tell them if you smoke, drink alcohol, or use illegal drugs. Some items may interact with your medicine. What should I watch for while using this medicine? Visit your doctor or health care professional for regular checks on your progress. Notify your doctor or health care professional and seek emergency treatment if you develop breathing problems; changes in vision; chest pain; severe, sudden headache; pain, swelling, warmth in the leg; trouble speaking; sudden numbness or weakness of the face, arm or leg. These can be signs that your condition has gotten worse. If you are going to have surgery or other procedure, tell your doctor that you are taking this medicine. What side effects may I notice from receiving this medicine? Side effects that you should report to your doctor or health care professional as soon as possible: -allergic reactions like skin rash, itching or hives, swelling of the face, lips, or tongue -back pain -redness, blistering, peeling or loosening of the skin, including inside the mouth -signs and symptoms of bleeding such as bloody or black, tarry stools; red or dark-brown urine; spitting up blood or brown material that looks like coffee grounds; red spots on the skin; unusual bruising or bleeding from the eye, gums, or nose Side effects that usually do not require medical attention (report to your doctor or health care professional if they continue or are bothersome): -dizziness -muscle pain This list may not describe all possible side effects. Call your doctor for medical advice about side effects. You may report side effects to FDA at 1-800-FDA-1088. Where should I keep my medicine? Keep out of the reach of children. Store at room temperature between 15 and  30 degrees C (59 and 86 degrees F). Throw away any unused medicine after the expiration date. NOTE: This sheet is a summary. It may not cover all possible information. If you have questions about this medicine, talk to your doctor, pharmacist, or health care provider.  2018 Elsevier/[employer] (2016-02-12 16:29:33)

## 2016-10-03 LAB — HEPATITIS C ANTIBODY: HCV Ab: NEGATIVE

## 2016-10-05 MED FILL — POTASSIUM CL 10 MEQ TAB SA: 10 | 30 days supply | Qty: 30 | Fill #0

## 2016-10-06 MED FILL — **XARELTO 20 MG TABLET: 20 MG | 4 days supply | Qty: 4 | Fill #0

## 2016-10-07 ENCOUNTER — Other Ambulatory Visit: Payer: Self-pay | Admitting: *Deleted

## 2016-10-07 DIAGNOSIS — Z86711 Personal history of pulmonary embolism: Secondary | ICD-10-CM

## 2016-10-07 MED ORDER — RIVAROXABAN 20 MG PO TABS: 20.0000 mg | ORAL_TABLET | Freq: Every day | ORAL | 6 refills | Status: DC

## 2016-10-07 NOTE — Telephone Encounter (Signed)
PRINTED FOR PASS PROGRAM 

## 2016-11-05 ENCOUNTER — Other Ambulatory Visit: Payer: Self-pay

## 2016-11-05 ENCOUNTER — Encounter: Payer: Self-pay | Admitting: Family Medicine

## 2016-11-05 ENCOUNTER — Ambulatory Visit: Payer: Self-pay | Admitting: Family Medicine

## 2016-11-05 ENCOUNTER — Ambulatory Visit (HOSPITAL_COMMUNITY)
Admission: RE | Admit: 2016-11-05 | Discharge: 2016-11-05 | Disposition: A | Discharge status: Home / Self Care | Payer: Self-pay | Source: Ambulatory Visit | Attending: Family Medicine | Admitting: Family Medicine

## 2016-11-05 ENCOUNTER — Ambulatory Visit (INDEPENDENT_AMBULATORY_CARE_PROVIDER_SITE_OTHER): Payer: Self-pay | Admitting: Family Medicine

## 2016-11-05 VITALS — BP 126/54 | Temp 98.2°F | Resp 18 | Ht 70.0 in | Wt 363.0 lb

## 2016-11-05 DIAGNOSIS — K5900 Constipation, unspecified: Secondary | ICD-10-CM

## 2016-11-05 DIAGNOSIS — G4730 Sleep apnea, unspecified: Secondary | ICD-10-CM

## 2016-11-05 DIAGNOSIS — R0600 Dyspnea, unspecified: Secondary | ICD-10-CM

## 2016-11-05 DIAGNOSIS — Z86711 Personal history of pulmonary embolism: Secondary | ICD-10-CM

## 2016-11-05 LAB — BRAIN NATRIURETIC PEPTIDE: BRAIN NATRIURETIC PEPTIDE: 15 pg/mL (ref <100)

## 2016-11-05 LAB — D-DIMER, QUANTITATIVE (NOT AT ARMC): D DIMER QUANT: 0.57 ug{FEU}/mL — AB (ref <0.50)

## 2016-11-05 MED ORDER — ALBUTEROL SULFATE HFA 108 (90 BASE) MCG/ACT IN AERS: 2.0000 | INHALATION_SPRAY | Freq: Four times a day (QID) | RESPIRATORY_TRACT | 0 refills | Status: DC | PRN

## 2016-11-05 MED FILL — VENTOLIN HFA 90 MCG INHALER: 108 (90 BAS | 25 days supply | Qty: 18 | Fill #0

## 2016-11-05 NOTE — Progress Notes (Signed)
Subjective:    Patient ID: Timothy Thomas, male    DOB: 1966/03/11, 51 y.o.   MRN: [id number]  HPI  Timothy Thomas, a 51 year old male with a history of COPD, morbid obesity, and pulmonary embolous presents accompanied by wife for a follow up appointment.  He was admitted to inpatient services on 09/26/2016 after passing out in his father's yard. He says that he was looking at 2 people mowing his father's grass and passed out for a few seconds. He says that he felt dizziness and disoriented. He was taken to the ER via EMS and found to have a submassive PE in the right lung. His troponin was also mildy elevated. 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 He saw cardiology on 11/05/2016 for a routine follow up.    Patient has been taking Xarelto consistently for pulmonary embolus. He says that he has had a difficult time getting refills due to financial constraints. He did not qualify for patient assistance program. He endorses shortness of breath chest pain, shortness of breath, signs of bleeding, lower extremity edema. He is a chronic everyday smoker. He smokes a little less than 1 pack per day. He is not ready to quit at this time.   He is complaining of dyspnea on exertion. He says that he gets short of breath with ambulation.  Symptoms include difficulty breathing and dyspnea on exertion. Symptoms began several days ago, symptoms have increased since that time. He also endorses edema to lower extremities that is worse at night.  Patient has not had recent travel.  Weight has increased by 3 pounds since previous visit.   Appetite has been unaffected. Symptoms are exacerbated by climbing stairs, lying flat and minimal activity.   Past Medical History:  Diagnosis Date  . Aphasia    Transient - negative head CT and EEG with neurology workup February 2017 [facility])  . COPD (chronic obstructive pulmonary disease) (HCC)   . Glaucoma   . Macular degeneration    . Obesity   . Obstructive sleep apnea   . Tachycardia-bradycardia syndrome (HCC)    Status post pacemaker  . Variant angina (HCC)    History of normal coronary arteries at cardiac catheterization 2002 - vasospasm noted  . Vasovagal syncope    Social History   Social History  . Marital status: Married    Spouse name: N/A  . Number of children: N/A  . Years of education: N/A   Occupational History  . Not on file.   Social History Main Topics  . Smoking status: Current Every Day Smoker    Packs/day: 1.00    Years: 40.00    Types: Cigarettes    Start date: 09/26/1976    Last attempt to quit: 09/26/2016  . Smokeless tobacco: Never Used  . Alcohol use No  . Drug use: No  . Sexual activity: Not on file   Other Topics Concern  . Not on file   Social History Narrative  . No narrative on file   Immunization History  Administered Date(s) Administered  . Pneumococcal Polysaccharide-23 10/02/2016   Review of Systems  Constitutional: Positive for unexpected weight change (weight gain).  Eyes: Negative.   Respiratory: Positive for shortness of breath.   Cardiovascular: Positive for leg swelling. Negative for chest pain.  Gastrointestinal: Negative.   Endocrine: Negative.  Negative for cold intolerance, heat intolerance, polydipsia, polyphagia and polyuria.  Genitourinary: Negative.   Allergic/Immunologic:  Negative.   Neurological: Negative.  Negative for weakness and numbness.  Hematological: Negative.        Objective:   Physical Exam  Constitutional: He is oriented to person, place, and time.  Morbid obesity  Diaphoretic   HENT:  Head: Normocephalic and atraumatic.  Right Ear: External ear normal.  Left Ear: External ear normal.  Nose: Nose normal.  Mouth/Throat: Oropharynx is clear and moist.  Eyes: Conjunctivae and EOM are normal. Pupils are equal, round, and reactive to light.  Neck: Normal range of motion. Neck supple.  Cardiovascular: Normal rate, regular  rhythm, normal heart sounds and intact distal pulses.   No murmur heard. Pulmonary/Chest: Effort normal. He has decreased breath sounds. He has no wheezes.  Abdominal: Soft. Bowel sounds are normal.  Increased abdominal girth  Musculoskeletal: Normal range of motion.  Neurological: He is alert and oriented to person, place, and time. He has normal reflexes.  Skin: Skin is warm and dry.  Psychiatric: He has a normal mood and affect. His behavior is normal. Judgment and thought content normal.        BP (!) 126/54 (BP Location: Left Arm, Patient Position: Sitting, Cuff Size: Large)   Temp 98.2 F (36.8 C) (Oral)   Resp 18   Ht 5\' 10"  (1.778 m)   Wt (!) 363 lb (164.7 kg)   SpO2 100%   BMI 52.09 kg/m  Assessment & Plan:  1. Dyspnea, unspecified type Patient is complaining of increased work of breathing and dyspnea over the past several days. He is a everyday smoker and has been for 30 years. I will get a chest xray to rule out acute cardiopulmonary process. Patient was evaluated by cardiology on 10/14/2016. Symptoms were also discussed with cardiology.  - D-dimer, quantitative (not at [facility]) - Brain natriuretic peptide - albuterol (PROVENTIL HFA;VENTOLIN HFA) 108 (90 Base) MCG/ACT inhaler; Inhale 2 puffs into the lungs every 6 (six) hours as needed for wheezing or shortness of breath.  Dispense: 1 Inhaler; Refill: 0 - DG Chest 2 View; Future 2. Morbid obesity (HCC) Recommend a lowfat, low carbohydrate diet divided over 5-6 small meals and increase water intake to 6-8 glasses,  3. History of pulmonary embolism Timothy Thomas is on Xarelto and has not missed any doses. He is having a difficult time obtaining refills due to cost constraints. He says that he has not qualified for any financial assistance program.  I contacted the pharmacy at [facility], who agreed to give patient samples for 22 days. He was also given contact information to patient assistance form Xarelto's parent  company.  Patient is not a good candidate for Warfarin. He lives in [place] and would be unable to have frequent laboratory testing.  - D-dimer, quantitative (not at [facility])  4. Sleep apnea, unspecified type Patient has a history of sleep apnea. He has not had settings assessed in a number of years. He does not have routine maintenance of CPAP appliance. He says it has been at least 5 years since he's had maintenance. He does attempt to clean it.  - Split night study; Future  5. Constipation, unspecified constipation type - Docusate Sodium (COLACE PO); Take by mouth   RTC: Will follow up by phone following chest xray results. Will schedule a follow up in 3 months for chronic conditions   Nyaisha Simao Rennis Petty  MSN, FNP-C [facility] Patient [facility] [place] [address] [place], [place] [address] [phone]

## 2016-11-05 NOTE — Patient Instructions (Addendum)
Will order a stat D-dimer for dyspnea and chest pain Will also order a chest xray and follow up by phone with results.    The patient was given clear instructions to go to ER or return to medical center if symptoms do not improve, worsen or new problems develop. The patient verbalized understanding.    Shortness of Breath, Adult Shortness of breath is when a person has trouble breathing enough air, or when a person feels like she or he is having trouble breathing in enough air. Shortness of breath could be a sign of medical problem. Follow these instructions at home: Pay attention to any changes in your symptoms. Take these actions to help with your condition:  Do not smoke. Smoking is a common cause of shortness of breath. If you smoke and you need help quitting, ask your health care provider.  Avoid things that can irritate your airways, such as: ? Mold. ? Dust. ? Air pollution. ? Chemical fumes. ? Things that can cause allergy symptoms (allergens), if you have allergies.  Keep your living space clean and free of mold and dust.  Rest as needed. Slowly return to your usual activities.  Take over-the-counter and prescription medicines, including oxygen and inhaled medicines, only as told by your health care provider.  Keep all follow-up visits as told by your health care provider. This is important.  Contact a health care provider if:  Your condition does not improve as soon as expected.  You have a hard time doing your normal activities, even after you rest.  You have new symptoms. Get help right away if:  Your shortness of breath gets worse.  You have shortness of breath when you are resting.  You feel light-headed or you faint.  You have a cough that is not controlled with medicines.  You cough up blood.  You have pain with breathing.  You have pain in your chest, arms, shoulders, or abdomen.  You have a fever.  You cannot walk up stairs or exercise the way  that you normally do. This information is not intended to replace advice given to you by your health care provider. Make sure you discuss any questions you have with your health care provider. Document Released: 02/17/2001 Document Revised: 12/14/2015 Document Reviewed: 10/31/2015 Elsevier Interactive Patient Education  [employer].

## 2016-11-06 ENCOUNTER — Telehealth: Payer: Self-pay

## 2016-11-06 MED FILL — **XARELTO 20 MG TABLET: 20 MG | 22 days supply | Qty: 22 | Fill #1

## 2016-11-06 NOTE — Telephone Encounter (Signed)
-----   Message from Massie Maroon, [place] sent at 11/06/2016  9:02 AM EDT ----- Regarding: xarelto assistance Please call Timothy Thomas concerning Xarelto  Assistance.   The number to Xarelto's program specialist is [phone] ( 9am-6 pm) to try to qualify for assistance over the phone.   Another assistance program is [phone], which will help uninsured patients.    Thanks

## 2016-11-06 NOTE — Telephone Encounter (Signed)
Called, no answer. No voicemail, was unable to leave a message. Will try later. Thanks!  

## 2016-11-09 ENCOUNTER — Telehealth: Payer: Self-pay | Admitting: Family Medicine

## 2016-11-09 DIAGNOSIS — R911 Solitary pulmonary nodule: Secondary | ICD-10-CM

## 2016-11-09 NOTE — Telephone Encounter (Signed)
Timothy Thomas, a 51 year old male with a history of pulmonary embolism was advised to report to the emergency department at [facility] due to an elevated D-dimer and worsening dyspnea on 11/05/2016. While at [facility], [name] had a CTA which shows a nodular opacity in the superior segment of the right lower lobe measuring 1.0X 1.0 cm Sub-carinal lymph node measuring 1.9 X1.1 cm and prominent, there are also several sub-centimenter mediastinal lymph nodes present.  I will send a referral to [facility] for further work-up and evaluation due to lymph node prominence and 30 year smoking history. I consider patient a high risk, needs close surveillance.    [name]  MSN, FNP-C [facility] Patient [facility] [address] [place], [place] [address] [phone]

## 2016-11-17 ENCOUNTER — Encounter: Payer: Self-pay | Admitting: Family Medicine

## 2016-11-23 ENCOUNTER — Encounter: Payer: Self-pay | Admitting: Family Medicine

## 2016-12-01 DIAGNOSIS — R55 Syncope and collapse: Secondary | ICD-10-CM | POA: Insufficient documentation

## 2016-12-01 DIAGNOSIS — D509 Iron deficiency anemia, unspecified: Secondary | ICD-10-CM | POA: Insufficient documentation

## 2017-01-12 DIAGNOSIS — M79606 Pain in leg, unspecified: Secondary | ICD-10-CM | POA: Insufficient documentation

## 2017-01-12 DIAGNOSIS — R350 Frequency of micturition: Secondary | ICD-10-CM | POA: Insufficient documentation

## 2017-01-12 DIAGNOSIS — E785 Hyperlipidemia, unspecified: Secondary | ICD-10-CM | POA: Insufficient documentation

## 2017-01-12 DIAGNOSIS — K922 Gastrointestinal hemorrhage, unspecified: Secondary | ICD-10-CM | POA: Insufficient documentation

## 2017-02-09 ENCOUNTER — Encounter: Payer: Self-pay | Admitting: Family Medicine

## 2017-02-09 ENCOUNTER — Ambulatory Visit (INDEPENDENT_AMBULATORY_CARE_PROVIDER_SITE_OTHER): Payer: Self-pay | Admitting: Family Medicine

## 2017-02-09 VITALS — BP 144/73 | HR 68 | Temp 97.8°F | Resp 14 | Ht 70.0 in | Wt 370.0 lb

## 2017-02-09 DIAGNOSIS — G4733 Obstructive sleep apnea (adult) (pediatric): Secondary | ICD-10-CM

## 2017-02-09 DIAGNOSIS — R0609 Other forms of dyspnea: Secondary | ICD-10-CM

## 2017-02-09 DIAGNOSIS — Z86711 Personal history of pulmonary embolism: Secondary | ICD-10-CM

## 2017-02-09 DIAGNOSIS — Z23 Encounter for immunization: Secondary | ICD-10-CM

## 2017-02-09 LAB — POCT URINALYSIS DIP (DEVICE)
BILIRUBIN URINE: NEGATIVE
Glucose, UA: NEGATIVE mg/dL
Hgb urine dipstick: NEGATIVE
KETONES UR: NEGATIVE mg/dL
NITRITE: NEGATIVE
PH: 6.5 (ref 5.0–8.0)
PROTEIN: NEGATIVE mg/dL
SPECIFIC GRAVITY, URINE: 1.02 (ref 1.005–1.030)
Urobilinogen, UA: 0.2 mg/dL (ref 0.0–1.0)

## 2017-02-09 MED ORDER — RIVAROXABAN 20 MG PO TABS: 20.0000 mg | ORAL_TABLET | Freq: Every day | ORAL | 6 refills | Status: DC

## 2017-02-09 MED ORDER — BUDESONIDE-FORMOTEROL FUMARATE 80-4.5 MCG/ACT IN AERO: 2.0000 | INHALATION_SPRAY | Freq: Two times a day (BID) | RESPIRATORY_TRACT | 3 refills | Status: DC

## 2017-02-09 MED FILL — SYMBICORT 80-4.5 MCG INH: 80-4.5 | 30 days supply | Qty: 10 | Fill #0

## 2017-02-09 MED FILL — XARELTO 20 MG TABLET: 20 | 30 days supply | Qty: 30 | Fill #0

## 2017-02-09 NOTE — Progress Notes (Signed)
Patient ID: Timothy Thomas, male    DOB: 05-12-1966, 51 y.o.   MRN: [id number]  PCP: Massie Maroon, FNP     Subjective:  HPI DEX Thomas is a 51 y.o. male presents for evaluation of 3 month follow-up.  Medical problems include history of smoking (quit 09/26/2016), Obesity, and OSA. [name] is chrinically coagulated with Xarelto for recent unprovoked PE and is followed by [facility] oncology for evaluation of  suspicious lung nodules. Timothy Thomas was last seen in clinic 11/05/2016 and continues to complain of ongoing dyspnea at rest and worsening with exertion. Timothy Thomas reports being unable to work a full week due to persistent persistent shortness of breath. 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 He has associated chronic fatigue. Timothy Thomas reports compliance with CPAP machine although admits to resuming tobacco use daily. He doesn't use his albuterol inhaler consistently and estimates use of inhaler as once weekly. Social History   Social History  . Marital status: Married    Spouse name: Timothy Thomas  . Number of children: Timothy Thomas  . Years of education: Timothy Thomas   Occupational History  . Not on file.   Social History Main Topics  . Smoking status: Current Every Day Smoker    Packs/day: 1.00    Years: 40.00    Types: Cigarettes    Start date: 09/26/1976    Last attempt to quit: 09/26/2016  . Smokeless tobacco: Never Used  . Alcohol use No  . Drug use: No  . Sexual activity: Not on file   Other Topics Concern  . Not on file   Social History Narrative  . No narrative on file    Family History  Problem Relation Age of Onset  . Hypertension Mother   . Diabetes Father   . Hypertension Father   . Stroke Father    Review of Systems See HPI Patient Active Problem List   Diagnosis Date Noted  . Syncope 09/26/2016  . Morbid obesity (HCC) 09/26/2016  . Acute pulmonary embolism (HCC) 09/26/2016    Allergies  Allergen  Reactions  . Codeine Nausea And Vomiting    Low tolerance to narcotics  . Ketorolac Nausea And Vomiting    Vomiting      Prior to Admission medications   Medication Sig Start Date End Date Taking? Authorizing Provider  albuterol (PROVENTIL HFA;VENTOLIN HFA) 108 (90 Base) MCG/ACT inhaler Inhale 2 puffs into the lungs every 6 (six) hours as needed for wheezing or shortness of breath. 11/05/16  Yes Massie Maroon, FNP  atorvastatin (LIPITOR) 40 MG tablet Take 1 tablet (40 mg total) by mouth daily at 6 PM. 09/29/16  Yes Orpah Cobb, MD  diphenhydramine-acetaminophen (TYLENOL PM) 25-500 MG TABS tablet Take 3 tablets by mouth at bedtime.    Yes [provider]  Docusate Sodium (COLACE PO) Take by mouth.   Yes [provider]  potassium chloride (K-DUR) 10 MEQ tablet Take 1 tablet (10 mEq total) by mouth daily. 10/02/16  Yes Massie Maroon, FNP  rivaroxaban (XARELTO) 20 MG TABS tablet Take 1 tablet (20 mg total) by mouth daily with supper. 10/20/16  Yes Quentin Angst, MD  ferrous sulfate 325 (65 FE) MG tablet Take 1 tablet (325 mg total) by mouth daily with breakfast. Patient not taking: Reported on 02/09/2017 10/02/16   Massie Maroon, FNP  mupirocin ointment (BACTROBAN) 2 % Place 1 application into the nose 2 (two) times daily. Patient  not taking: Reported on 02/09/2017 09/29/16   Orpah Cobb, MD    Past Medical, Surgical Family and Social History reviewed and updated.    Objective:   Today's Vitals   02/09/17 1055  BP: (!) 144/73  Pulse: 68  Resp: 14  Temp: 97.8 F (36.6 C)  TempSrc: Oral  SpO2: 99%  Weight: (!) 370 lb (167.8 kg)  Height: 5\' 10"  (1.778 m)    Wt Readings from Last 3 Encounters:  02/09/17 (!) 370 lb (167.8 kg)  11/05/16 (!) 363 lb (164.7 kg)  10/02/16 (!) 360 lb (163.3 kg)   Physical Exam         Assessment & Plan:  1. OSA (obstructive sleep apnea) - CPAP mask prescription provided  - Continue CPAP nightly   2. Dyspnea on  exertion -Start incentive spirometry 10 cycles, 2-3 per day to improve ventilation and lung capacity  -Increase use of albuterol 2 puffs, every 4-6 hours PRN for shortness of breath -Adding Symbicort, 2 puffs twice daily to improve ventilation  3. Hx of pulmonary embolus Continue  rivaroxaban (XARELTO) 20 MG TABS tablet; T  4. Need for influenza vaccination - Flu Vaccine QUAD 36+ mos IM   Orders Placed This Encounter  Procedures  . DME Other see comment  . DME Other see comment  . Flu Vaccine QUAD 36+ mos IM  . POCT urinalysis dip (device)   Meds ordered this encounter  Medications  . budesonide-formoterol (SYMBICORT) 80-4.5 MCG/ACT inhaler    Sig: Inhale 2 puffs into the lungs 2 (two) times daily.    Dispense:  1 Inhaler    Refill:  3    Order Specific Question:   Supervising Provider    Answer:   Quentin Angst [id number]  . rivaroxaban (XARELTO) 20 MG TABS tablet    Sig: Take 1 tablet (20 mg total) by mouth daily with supper.    Dispense:  30 tablet    Refill:  6    Order Specific Question:   Supervising Provider    Answer:   Quentin Angst [[id number]]    RTC: 3 months for follow-up of chronic condition  Godfrey Pick. Tiburcio Pea, MSN, FNP-C [facility]  [address] [place], [place] [address] [phone]

## 2017-02-09 NOTE — Patient Instructions (Signed)
Resume albuterol 2 puffs every 4-6 hours as needed for shortness of breath.   Use incentive spirometry 10 cycles 2-3 times per day to improve oxygenation.    I have added Symbicort 2 puffs twice daily as prescribed to improve dyspnea.   I will discuss with Ms. [employer]

## 2017-02-19 ENCOUNTER — Telehealth: Payer: Self-pay | Admitting: Family Medicine

## 2017-02-19 NOTE — Telephone Encounter (Signed)
I apologize, thought I sent this request to you in a phone message previously. 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 I had advised him during his office visit that I would forward this request to you for review and to handle.  Godfrey Pick. Tiburcio Pea, MSN, FNP-C [facility]  [address] [place], [place] [address] [phone]

## 2017-03-01 ENCOUNTER — Telehealth: Payer: Self-pay

## 2017-03-01 NOTE — Telephone Encounter (Signed)
-----   Message from Massie Maroon, [place] sent at 02/25/2017  5:22 PM EDT ----- Regarding: FMLA Please have his job fax FMLA forms for completion.   Thanks.

## 2017-03-01 NOTE — Telephone Encounter (Signed)
Called, no answer. Left a message for patient to return call. Thanks!  

## 2017-03-05 ENCOUNTER — Telehealth: Payer: Self-pay

## 2017-03-08 ENCOUNTER — Telehealth: Payer: Self-pay | Admitting: Family Medicine

## 2017-03-08 NOTE — Telephone Encounter (Signed)
[name], a 51 year old male with a history of pulmonary embolism is inquiring about disability services. Advised to inquire with local department of disabilities or social services. There are websites available online.   Also, inquiring about Xarelto application. Advised to contact [employer].    [name]  MSN, FNP-C Patient Care [facility] [address] [place], [place] [address] [phone]

## 2017-03-24 ENCOUNTER — Encounter: Payer: Self-pay | Admitting: Family Medicine

## 2017-03-24 ENCOUNTER — Ambulatory Visit (INDEPENDENT_AMBULATORY_CARE_PROVIDER_SITE_OTHER): Payer: Self-pay | Admitting: Family Medicine

## 2017-03-24 ENCOUNTER — Ambulatory Visit: Payer: Self-pay | Attending: Family Medicine

## 2017-03-24 VITALS — BP 132/59 | HR 60 | Temp 97.9°F | Resp 18 | Ht 70.0 in | Wt 367.0 lb

## 2017-03-24 DIAGNOSIS — D649 Anemia, unspecified: Secondary | ICD-10-CM

## 2017-03-24 DIAGNOSIS — Z86711 Personal history of pulmonary embolism: Secondary | ICD-10-CM

## 2017-03-24 DIAGNOSIS — R0602 Shortness of breath: Secondary | ICD-10-CM

## 2017-03-24 DIAGNOSIS — R05 Cough: Secondary | ICD-10-CM

## 2017-03-24 DIAGNOSIS — R252 Cramp and spasm: Secondary | ICD-10-CM

## 2017-03-24 DIAGNOSIS — J449 Chronic obstructive pulmonary disease, unspecified: Secondary | ICD-10-CM

## 2017-03-24 DIAGNOSIS — R0600 Dyspnea, unspecified: Secondary | ICD-10-CM

## 2017-03-24 DIAGNOSIS — R059 Cough, unspecified: Secondary | ICD-10-CM

## 2017-03-24 LAB — CBC WITH DIFFERENTIAL/PLATELET
BASOS PCT: 0.8 %
Basophils Absolute: 80 cells/uL (ref 0–200)
EOS ABS: 300 {cells}/uL (ref 15–500)
Eosinophils Relative: 3 %
HCT: 37.1 % — ABNORMAL LOW (ref 38.5–50.0)
Hemoglobin: 12.3 g/dL — ABNORMAL LOW (ref 13.2–17.1)
Lymphs Abs: 2670 cells/uL (ref 850–3900)
MCH: 28.3 pg (ref 27.0–33.0)
MCHC: 33.2 g/dL (ref 32.0–36.0)
MCV: 85.5 fL (ref 80.0–100.0)
MONOS PCT: 8.6 %
MPV: 9.4 fL (ref 7.5–12.5)
NEUTROS ABS: 6090 {cells}/uL (ref 1500–7800)
Neutrophils Relative %: 60.9 %
Platelets: 270 10*3/uL (ref 140–400)
RBC: 4.34 10*6/uL (ref 4.20–5.80)
RDW: 13.4 % (ref 11.0–15.0)
Total Lymphocyte: 26.7 %
WBC: 10 10*3/uL (ref 3.8–10.8)
WBCMIX: 860 {cells}/uL (ref 200–950)

## 2017-03-24 LAB — COMPLETE METABOLIC PANEL WITH GFR
AG Ratio: 1.5 (calc) (ref 1.0–2.5)
ALT: 13 U/L (ref 9–46)
AST: 11 U/L (ref 10–35)
Albumin: 4 g/dL (ref 3.6–5.1)
Alkaline phosphatase (APISO): 71 U/L (ref 40–115)
BUN/Creatinine Ratio: 14 (calc) (ref 6–22)
BUN: 10 mg/dL (ref 7–25)
CALCIUM: 9 mg/dL (ref 8.6–10.3)
CO2: 24 mmol/L (ref 20–32)
CREATININE: 0.69 mg/dL — AB (ref 0.70–1.33)
Chloride: 105 mmol/L (ref 98–110)
GFR, EST NON AFRICAN AMERICAN: 108 mL/min/{1.73_m2} (ref >=60)
GFR, Est African American: 126 mL/min/{1.73_m2} (ref >=60)
Globulin: 2.7 g/dL (calc) (ref 1.9–3.7)
Glucose, Bld: 102 mg/dL — ABNORMAL HIGH (ref 65–99)
POTASSIUM: 4 mmol/L (ref 3.5–5.3)
Sodium: 139 mmol/L (ref 135–146)
Total Bilirubin: 0.4 mg/dL (ref 0.2–1.2)
Total Protein: 6.7 g/dL (ref 6.1–8.1)

## 2017-03-24 MED ORDER — FLUTICASONE FUROATE-VILANTEROL 100-25 MCG/INH IN AEPB: 1.0000 | INHALATION_SPRAY | Freq: Every day | RESPIRATORY_TRACT | 12 refills | Status: DC

## 2017-03-24 MED ORDER — ALBUTEROL SULFATE HFA 108 (90 BASE) MCG/ACT IN AERS: 2.0000 | INHALATION_SPRAY | RESPIRATORY_TRACT | 5 refills | Status: DC | PRN

## 2017-03-24 MED ORDER — RIVAROXABAN 20 MG PO TABS: 20.0000 mg | ORAL_TABLET | Freq: Every day | ORAL | 6 refills | Status: DC

## 2017-03-24 MED FILL — !BREO ELLIPTA 100-25 MCG IN: 100-25 | 30 days supply | Qty: 60 | Fill #0

## 2017-03-24 MED FILL — XARELTO 20 MG TABLET: 20 | 30 days supply | Qty: 30 | Fill #0

## 2017-03-24 MED FILL — !VENTOLIN HFA INHALER: 108 (90 BAS | 25 days supply | Qty: 18 | Fill #0

## 2017-03-24 NOTE — Patient Instructions (Addendum)
Will follow up by phone with any abnormal laboratory result A referral has been sent to pulmonology for further evaluation.  Added Breo daily for COPD.   Chronic Obstructive Pulmonary Disease Chronic obstructive pulmonary disease (COPD) is a common lung condition in which airflow from the lungs is limited. COPD is a general term that can be used to describe many different lung problems that limit airflow, including both chronic bronchitis and emphysema. If you have COPD, your lung function will probably never return to normal, but there are measures you can take to improve lung function and make yourself feel better. What are the causes?  Smoking (common).  Exposure to secondhand smoke.  Genetic problems.  Chronic inflammatory lung diseases or recurrent infections. What are the signs or symptoms?  Shortness of breath, especially with physical activity.  Deep, persistent (chronic) cough with a large amount of thick mucus.  Wheezing.  Rapid breaths (tachypnea).  Gray or bluish discoloration (cyanosis) of the skin, especially in your fingers, toes, or lips.  Fatigue.  Weight loss.  Frequent infections or episodes when breathing symptoms become much worse (exacerbations).  Chest tightness. How is this diagnosed? Your health care provider will take a medical history and perform a physical examination to diagnose COPD. Additional tests for COPD may include:  Lung (pulmonary) function tests.  Chest X-ray.  CT scan.  Blood tests.  How is this treated? Treatment for COPD may include:  Inhaler and nebulizer medicines. These help manage the symptoms of COPD and make your breathing more comfortable.  Supplemental oxygen. Supplemental oxygen is only helpful if you have a low oxygen level in your blood.  Exercise and physical activity. These are beneficial for nearly all people with COPD.  Lung surgery or transplant.  Nutrition therapy to gain weight, if you are  underweight.  Pulmonary rehabilitation. This may involve working with a team of health care providers and specialists, such as respiratory, occupational, and physical therapists.  Follow these instructions at home:  Take all medicines (inhaled or pills) as directed by your health care provider.  Avoid over-the-counter medicines or cough syrups that dry up your airway (such as antihistamines) and slow down the elimination of secretions unless instructed otherwise by your health care provider.  If you are a smoker, the most important thing that you can do is stop smoking. Continuing to smoke will cause further lung damage and breathing trouble. Ask your health care provider for help with quitting smoking. He or she can direct you to community resources or hospitals that provide support.  Avoid exposure to irritants such as smoke, chemicals, and fumes that aggravate your breathing.  Use oxygen therapy and pulmonary rehabilitation if directed by your health care provider. If you require home oxygen therapy, ask your health care provider whether you should purchase a pulse oximeter to measure your oxygen level at home.  Avoid contact with individuals who have a contagious illness.  Avoid extreme temperature and humidity changes.  Eat healthy foods. Eating smaller, more frequent meals and resting before meals may help you maintain your strength.  Stay active, but balance activity with periods of rest. Exercise and physical activity will help you maintain your ability to do things you want to do.  Preventing infection and hospitalization is very important when you have COPD. Make sure to receive all the vaccines your health care provider recommends, especially the pneumococcal and influenza vaccines. Ask your health care provider whether you need a pneumonia vaccine.  Learn and use relaxation techniques  to manage stress.  Learn and use controlled breathing techniques as directed by your health  care provider. Controlled breathing techniques include: 1. Pursed lip breathing. Start by breathing in (inhaling) through your nose for 1 second. Then, purse your lips as if you were going to whistle and breathe out (exhale) through the pursed lips for 2 seconds. 2. Diaphragmatic breathing. Start by putting one hand on your abdomen just above your waist. Inhale slowly through your nose. The hand on your abdomen should move out. Then purse your lips and exhale slowly. You should be able to feel the hand on your abdomen moving in as you exhale.  Learn and use controlled coughing to clear mucus from your lungs. Controlled coughing is a series of short, progressive coughs. The steps of controlled coughing are: 1. Lean your head slightly forward. 2. Breathe in deeply using diaphragmatic breathing. 3. Try to hold your breath for 3 seconds. 4. Keep your mouth slightly open while coughing twice. 5. Spit any mucus out into a tissue. 6. Rest and repeat the steps once or twice as needed. Contact a health care provider if:  You are coughing up more mucus than usual.  There is a change in the color or thickness of your mucus.  Your breathing is more labored than usual.  Your breathing is faster than usual. Get help right away if:  You have shortness of breath while you are resting.  You have shortness of breath that prevents you from: ? Being able to talk. ? Performing your usual physical activities.  You have chest pain lasting longer than 5 minutes.  Your skin color is more cyanotic than usual.  You measure low oxygen saturations for longer than 5 minutes with a pulse oximeter. This information is not intended to replace advice given to you by your health care provider. Make sure you discuss any questions you have with your health care provider. Document Released: 03/04/2005 Document Revised: 10/31/2015 Document Reviewed: 01/19/2013 Elsevier Interactive Patient Education  2017 [employer]

## 2017-03-24 NOTE — Progress Notes (Signed)
Subjective:    Patient ID: Timothy Thomas, male    DOB: 11/18/1965, 51 y.o.   MRN: [id number]  HPI  Timothy Thomas, a very pleasant 51 year old male with a history of COPD, morbid obesity, and pulmonary embolous presents accompanied by wife Timothy Thomas for follow up.  He is complaining of worsening dyspnea on exertion. He says that he gets short of breath with ambulation. It has been difficult to ambulate in home. He has a history of obstructive sleep apnea.  Symptoms include difficulty breathing and dyspnea on exertion. Symptoms began several days ago, symptoms have increased since that time. He also endorses edema to lower extremities that is worse at night.  Patient has not had recent travel. Patient has been using Symbicort twice daily without relief.  Appetite has been unaffected. Symptoms are exacerbated by climbing stairs, lying flat and minimal activity.   [name] had a CT of lung in June that showed a 1.0X 1.0 cm nodular area in the RLL. Also, there was lymph node prominence in the subcarinal and right hilar region and mild lymphoadenopathy. He was referred to [facility] in [place], [place]. His next scheduled appointment is on 04/13/2017.    Patient has been taking Xarelto consistently for pulmonary embolus. He endorses shortness of breath chest pain, shortness of breath, signs of bleeding, lower extremity edema. He is a chronic everyday smoker. He smokes a little less than 1 pack per day. He is not ready to quit at this time.    Past Medical History:  Diagnosis Date  . Aphasia    Transient - negative head CT and EEG with neurology workup February 2017 [facility])  . COPD (chronic obstructive pulmonary disease) (HCC)   . Glaucoma   . Macular degeneration   . Obesity   . Obstructive sleep apnea   . Tachycardia-bradycardia syndrome (HCC)    Status post pacemaker  . Variant angina (HCC)    History of normal coronary arteries at cardiac catheterization 2002 - vasospasm noted   . Vasovagal syncope    Social History   Social History  . Marital status: Married    Spouse name: N/A  . Number of children: N/A  . Years of education: N/A   Occupational History  . Not on file.   Social History Main Topics  . Smoking status: Current Every Day Smoker    Packs/day: 1.00    Years: 40.00    Types: Cigarettes    Start date: 09/26/1976    Last attempt to quit: 09/26/2016  . Smokeless tobacco: Never Used  . Alcohol use No  . Drug use: No  . Sexual activity: Not on file   Other Topics Concern  . Not on file   Social History Narrative  . No narrative on file   Immunization History  Administered Date(s) Administered  . Influenza,inj,Quad PF,6+ Mos 02/09/2017  . Pneumococcal Polysaccharide-23 10/02/2016   Review of Systems  Constitutional: Positive for unexpected weight change (morbid obesity).  Eyes: Negative.   Respiratory: Positive for apnea, cough, chest tightness and shortness of breath. Negative for wheezing.   Cardiovascular: Positive for leg swelling. Negative for chest pain.  Gastrointestinal: Negative.   Endocrine: Negative.  Negative for cold intolerance, heat intolerance, polydipsia, polyphagia and polyuria.  Genitourinary: Negative.   Allergic/Immunologic: Negative.   Neurological: Negative.  Negative for weakness and numbness.  Hematological: Negative.        Objective:   Physical Exam  Constitutional: He is oriented to  person, place, and time.  Morbid obesit   HENT:  Head: Normocephalic and atraumatic.  Right Ear: External ear normal.  Left Ear: External ear normal.  Nose: Nose normal.  Mouth/Throat: Oropharynx is clear and moist.  Eyes: Pupils are equal, round, and reactive to light. Conjunctivae and EOM are normal.  Neck: Normal range of motion. Neck supple.  Cardiovascular: Normal rate, regular rhythm, normal heart sounds and intact distal pulses.   No murmur heard. Pulmonary/Chest: Effort normal. He has decreased breath  sounds. He has no wheezes.  Abdominal: Soft. Bowel sounds are normal.  Increased abdominal girth  Musculoskeletal: Normal range of motion.  Neurological: He is alert and oriented to person, place, and time. He has normal reflexes.  Skin: Skin is warm and dry.  Psychiatric: He has a normal mood and affect. His behavior is normal. Judgment and thought content normal.        BP (!) 132/59 (BP Location: Left Arm, Patient Position: Sitting, Cuff Size: Large)   Pulse 60   Temp 97.9 F (36.6 C) (Oral)   Resp 18   Ht 5\' 10"  (1.778 m)   Wt (!) 367 lb (166.5 kg)   SpO2 99%   BMI 52.66 kg/m  Assessment & Plan:  1. Chronic obstructive pulmonary disease, unspecified COPD type (HCC) I suspect that Timothy Thomas has worsening COPD. He is morbidly obese and is a chronic everyday smoker. He is experiencing further shortness of breath. Recommend that patient decrease smoking.  Timothy Thomas warrants a referral to pulmonology for further workup and evaluation.  - fluticasone furoate-vilanterol (BREO ELLIPTA) 100-25 MCG/INH AEPB; Inhale 1 puff into the lungs daily.  Dispense: 1 each; Refill: 12   2. Dyspnea, unspecified type - albuterol (PROVENTIL HFA;VENTOLIN HFA) 108 (90 Base) MCG/ACT inhaler; Inhale 2 puffs into the lungs every 4 (four) hours as needed for wheezing or shortness of breath.  Dispense: 1 Inhaler; Refill: 5 - fluticasone furoate-vilanterol (BREO ELLIPTA) 100-25 MCG/INH AEPB; Inhale 1 puff into the lungs daily.  Dispense: 1 each; Refill: 12  3. Shortness of breath - Ambulatory referral to Pulmonology  4. Leg cramps - COMPLETE METABOLIC PANEL WITH GFR  5. Anemia, unspecified type - CBC with Differential  6. Cough - Ambulatory referral to Pulmonology - fluticasone furoate-vilanterol (BREO ELLIPTA) 100-25 MCG/INH AEPB; Inhale 1 puff into the lungs daily.  Dispense: 1 each; Refill: 12  7. Morbid obesity (HCC) Given copy of 1800 low fat, low carbohydrate diet divided over 5-6 small  meals. His wife prepares most meals.   8. Hx of pulmonary embolus - rivaroxaban (XARELTO) 20 MG TABS tablet; Take 1 tablet (20 mg total) by mouth daily with supper.  Dispense: 30 tablet; Refill: 6   RTC: 3 months for chronic conditions 50% of appointment reviewing previous labs, discussing referral, and dietary counseling.   Nolon NationsLaChina Moore Jennene Downie  MSN, FNP-C [facility] [address]  [place], [place] [address] [phone]

## 2017-03-25 DIAGNOSIS — J449 Chronic obstructive pulmonary disease, unspecified: Secondary | ICD-10-CM | POA: Insufficient documentation

## 2017-04-02 ENCOUNTER — Telehealth: Payer: Self-pay

## 2017-04-02 NOTE — Telephone Encounter (Signed)
Called and spoke to patient. He advised that he had a CT done with [facility] yesterday and they have discovered another blood clot in the lung. He is going to follow up with them today and they are going to start him on 5 days of Lovenox injections, stop the xarelto, and start coumadin. He will get them to send over notes. Just wanted to give you and fyi.

## 2017-04-14 ENCOUNTER — Ambulatory Visit (INDEPENDENT_AMBULATORY_CARE_PROVIDER_SITE_OTHER): Payer: Self-pay | Admitting: Family Medicine

## 2017-04-14 ENCOUNTER — Encounter: Payer: Self-pay | Admitting: Family Medicine

## 2017-04-14 VITALS — BP 121/75 | HR 65 | Temp 98.4°F | Resp 18 | Ht 70.0 in | Wt 367.0 lb

## 2017-04-14 DIAGNOSIS — R05 Cough: Secondary | ICD-10-CM

## 2017-04-14 DIAGNOSIS — R053 Chronic cough: Secondary | ICD-10-CM

## 2017-04-14 DIAGNOSIS — Z7901 Long term (current) use of anticoagulants: Secondary | ICD-10-CM

## 2017-04-14 DIAGNOSIS — J449 Chronic obstructive pulmonary disease, unspecified: Secondary | ICD-10-CM

## 2017-04-14 MED ORDER — FLUTICASONE-SALMETEROL 100-50 MCG/DOSE IN AEPB: 1.0000 | INHALATION_SPRAY | Freq: Two times a day (BID) | RESPIRATORY_TRACT | 3 refills | Status: DC

## 2017-04-14 MED ORDER — IPRATROPIUM BROMIDE 0.02 % IN SOLN
0.5000 mg | Freq: Once | RESPIRATORY_TRACT | Status: AC
  Administered 2017-04-14: 0.5 mg via RESPIRATORY_TRACT

## 2017-04-14 MED ORDER — ALBUTEROL SULFATE (2.5 MG/3ML) 0.083% IN NEBU
2.5000 mg | INHALATION_SOLUTION | Freq: Once | RESPIRATORY_TRACT | Status: AC
  Administered 2017-04-14: 2.5 mg via RESPIRATORY_TRACT

## 2017-04-14 MED ORDER — PREDNISONE 20 MG PO TABS: 40.0000 mg | ORAL_TABLET | Freq: Every day | ORAL | 0 refills | Status: AC

## 2017-04-14 MED FILL — ?PREDNISONE 20MG TABLET: 20 | 5 days supply | Qty: 10 | Fill #0

## 2017-04-14 MED FILL — !ADVAIR 100/50 DISKUS: 100-50 | 30 days supply | Qty: 60 | Fill #0

## 2017-04-14 NOTE — Patient Instructions (Addendum)
Will start a trial of prednisone 40 mg daily for COPD exacerbation.  Will also start Advair as directed for COPD maintenance.  We will look into past referral to pulmonology. We will follow-up in 3 months for COPD Fluticasone; Salmeterol inhalation powder What is this medicine? FLUTICASONE; SALMETEROL (floo TIK a sone; sal ME te role) inhalation is a combination of two medicines that decrease inflammation and help to open up the airways of your lungs. It is used to treat COPD. This medicine is also used to treat asthma. Do NOT use for an acute asthma attack. Do NOT use for a COPD attack. This medicine may be used for other purposes; ask your health care provider or pharmacist if you have questions. COMMON BRAND NAME(S): Advair, Airduo RespiClick What should I tell my health care provider before I take this medicine? They need to know if you have any of these conditions: -bone problems -immune system problems -diabetes -heart disease or irregular heartbeat -high blood pressure -infection -pheochromocytoma -seizures -thyroid disease -worsening asthma -an unusual or allergic reaction to fluticasone, salmeterol, other corticosteroids, other medicines, foods, dyes, or preservatives -pregnant or trying to get pregnant -breast-feeding How should I use this medicine? This medicine is inhaled through the mouth. Rinse your mouth with water after use. Make sure not to swallow the water. Follow the directions on the prescription label. Do not use a spacer device with this inhaler. Take your medicine at regular intervals. Do not take your medicine more often than directed. Do not stop taking except on your doctor's advice. Make sure that you are using your inhaler correctly. Ask you doctor or health care provider if you have any questions. A special MedGuide will be given to you by the pharmacist with each prescription and refill. Be sure to read this information carefully each time. Talk to your  pediatrician regarding the use of this medicine in children. Special care may be needed. Overdosage: If you think you have taken too much of this medicine contact a poison control center or emergency room at once. NOTE: This medicine is only for you. Do not share this medicine with others. What if I miss a dose? If you miss a dose, use it as soon as you remember. If it is almost time for your next dose, use only that dose and continue with your regular schedule, spacing doses evenly. Do not use double or extra doses. What may interact with this medicine? Do not take this medicine with any of the following medications: -MAOIs like Carbex, Eldepryl, Marplan, Nardil, and Parnate This medicine may also interact with the following medications: -aminophylline or theophylline -antiviral medicines for HIV or AIDS -beta-blockers like metoprolol and propranolol -certain antibiotics like clarithromycin, erythromycin, levofloxacin, linezolid, and telithromycin -certain medicines for fungal infections like ketoconazole, itraconazole, posaconazole, voriconazole -conivaptan -diuretics -medicines for colds -medicines for depression or emotional conditions -nefazodone -vaccines This list may not describe all possible interactions. Give your health care provider a list of all the medicines, herbs, non-prescription drugs, or dietary supplements you use. Also tell them if you smoke, drink alcohol, or use illegal drugs. Some items may interact with your medicine. What should I watch for while using this medicine? Visit your doctor for regular check ups. Tell your doctor or health care professional if your symptoms do not get better. Do not use this medicine more than every 12 hours. NEVER use this medicine for an acute asthma attack. You should use your short-acting rescue inhalers for this purpose. If your  symptoms get worse or if you need your short-acting inhalers more often, call your doctor right away. If  you are going to have surgery tell your doctor or health care professional that you are using this medicine. Try not to come in contact with people with the chicken pox or measles. If you do, call your doctor. What side effects may I notice from receiving this medicine? Side effects that you should report to your doctor or health care professional as soon as possible: -allergic reactions like skin rash or hives, swelling of the face, lips, or tongue -chest pain -dizziness or lightheaded -fever or chills -irregular heartbeat -vision problems Side effects that usually do not require medical attention (report to your doctor or health care professional if they continue or are bothersome): -coughing, hoarseness, throat irritation -headache -nervousness -stomach problems -stuffy nose -tremor This list may not describe all possible side effects. Call your doctor for medical advice about side effects. You may report side effects to FDA at 1-800-FDA-1088. Where should I keep my medicine? Keep out of the reach of children. Advair: Store at room temperature between 68 and 77 degrees F (20 and 25 degrees C). Do not leave your medicine in the heat or sun. Throw away 1 month after you open the package or whenever the dose indicator reads 0, whichever comes first. Throw away unopened packages after the expiration date. Airduo Respiclick: Store at room temperature between 59 and 86 degrees F (15 and 30 degrees C). Avoid exposure to extreme heat, cold, or humidity. Throw away 1 month after you open the package or whenever the dose indicator reads 0, whichever comes first. Throw away unopened packages after the expiration date. NOTE: This sheet is a summary. It may not cover all possible information. If you have questions about this medicine, talk to your doctor, pharmacist, or health care provider.  2018 Elsevier/Gold Standard (2016-05-28 15:40:16)  Chronic Obstructive Pulmonary Disease Chronic obstructive  pulmonary disease (COPD) is a long-term (chronic) lung problem. When you have COPD, it is hard for air to get in and out of your lungs. The way your lungs work will never return to normal. Usually the condition gets worse over time. There are things you can do to keep yourself as healthy as possible. Your doctor may treat your condition with:  Medicines.  Quitting smoking, if you smoke.  Rehabilitation. This may involve a team of specialists.  Oxygen.  Exercise and changes to your diet.  Lung surgery.  Comfort measures (palliative care).  Follow these instructions at home: Medicines  Take over-the-counter and prescription medicines only as told by your doctor.  Talk to your doctor before taking any cough or allergy medicines. You may need to avoid medicines that cause your lungs to be dry. Lifestyle  If you smoke, stop. Smoking makes the problem worse. If you need help quitting, ask your doctor.  Avoid being around things that make your breathing worse. This may include smoke, chemicals, and fumes.  Stay active, but remember to also rest.  Learn and use tips on how to relax.  Make sure you get enough sleep. Most adults need at least 7 hours a night.  Eat healthy foods. Eat smaller meals more often. Rest before meals. Controlled breathing  Learn and use tips on how to control your breathing as told by your doctor. Try: ? Breathing in (inhaling) through your nose for 1 second. Then, pucker your lips and breath out (exhale) through your lips for 2 seconds. ? Putting  one hand on your belly (abdomen). Breathe in slowly through your nose for 1 second. Your hand on your belly should move out. Pucker your lips and breathe out slowly through your lips. Your hand on your belly should move in as you breathe out. Controlled coughing  Learn and use controlled coughing to clear mucus from your lungs. The steps are: 1. Lean your head a little forward. 2. Breathe in deeply. 3. Try to  hold your breath for 3 seconds. 4. Keep your mouth slightly open while coughing 2 times. 5. Spit any mucus out into a tissue. 6. Rest and do the steps again 1 or 2 times as needed. General instructions  Make sure you get all the shots (vaccines) that your doctor recommends. Ask your doctor about a flu shot and a pneumonia shot.  Use oxygen therapy and therapy to help improve your lungs (pulmonary rehabilitation) if told by your doctor. If you need home oxygen therapy, ask your doctor if you should buy a tool to measure your oxygen level (oximeter).  Make a COPD action plan with your doctor. This helps you know what to do if you feel worse than usual.  Manage any other conditions you have as told by your doctor.  Avoid going outside when it is very hot, cold, or humid.  Avoid people who have a sickness you can catch (contagious).  Keep all follow-up visits as told by your doctor. This is important. Contact a doctor if:  You cough up more mucus than usual.  There is a change in the color or thickness of the mucus.  It is harder to breathe than usual.  Your breathing is faster than usual.  You have trouble sleeping.  You need to use your medicines more often than usual.  You have trouble doing your normal activities such as getting dressed or walking around the house. Get help right away if:  You have shortness of breath while resting.  You have shortness of breath that stops you from: ? Being able to talk. ? Doing normal activities.  Your chest hurts for longer than 5 minutes.  Your skin color is more blue than usual.  Your pulse oximeter shows that you have low oxygen for longer than 5 minutes.  You have a fever.  You feel too tired to breathe normally. Summary  Chronic obstructive pulmonary disease (COPD) is a long-term lung problem.  The way your lungs work will never return to normal. Usually the condition gets worse over time. There are things you can do to  keep yourself as healthy as possible.  Take over-the-counter and prescription medicines only as told by your doctor.  If you smoke, stop. Smoking makes the problem worse. This information is not intended to replace advice given to you by your health care provider. Make sure you discuss any questions you have with your health care provider. Document Released: 11/11/2007 Document Revised: 10/31/2015 Document Reviewed: 01/19/2013 Elsevier Interactive Patient Education  2017 [employer].

## 2017-04-14 NOTE — Progress Notes (Signed)
Subjective:    Patient ID: Timothy Thomas, male    DOB: Feb 25, 1966, 51 y.o.   MRN: [id number]  HPI  [name], a very pleasant 51 year old male with a history of COPD, morbid obesity, and pulmonary embolous presents for follow up.  He is complaining of worsening dyspnea on exertion.  Patient was started on a long-acting beta agonist 1 month ago without relief.  A referral was also sent to pulmonology for further evaluation.  He is not scheduled appointment.  He states that he is using albuterol multiple times a day for relief.  He describes cough as constant and nonproductive.  He continues to be a chronic every day tobacco user and is morbidly obese.  He says that he gets short of breath with ambulation. It has been difficult to ambulate in home. He has a history of obstructive sleep apnea.  Symptoms include difficulty breathing and dyspnea on exertion. Symptoms began several days ago, symptoms have increased since that time. He also endorses edema to lower extremities that is worse at night.  Patient has not had recent travel. Patient has been using Symbicort twice daily without relief.  Appetite has been unaffected. Symptoms are exacerbated by climbing stairs, lying flat and minimal activity.   [name] had a CT of lung in June that showed a 1.0X 1.0 cm nodular area in the RLL. Also, there was lymph node prominence in the subcarinal and right hilar region and mild lymphoadenopathy. He was referred to [facility] in [place], [place].     Patient was recently started on warfarin for chronic pulmonary embolus.  His most recent INR was 3, which is therapeutic he endorses shortness of breath chest pain, shortness of breath, signs of bleeding, lower extremity edema. He is a chronic everyday smoker. He smokes a little less than 1 pack per day. He is not ready to quit at this time.    Past Medical History:  Diagnosis Date  . Aphasia    Transient - negative head CT and EEG with  neurology workup February 2017 [facility])  . COPD (chronic obstructive pulmonary disease) (HCC)   . Glaucoma   . Macular degeneration   . Obesity   . Obstructive sleep apnea   . Tachycardia-bradycardia syndrome (HCC)    Status post pacemaker  . Variant angina (HCC)    History of normal coronary arteries at cardiac catheterization 2002 - vasospasm noted  . Vasovagal syncope    Social History   Socioeconomic History  . Marital status: Married    Spouse name: Not on file  . Number of children: Not on file  . Years of education: Not on file  . Highest education level: Not on file  Social Needs  . Financial resource strain: Not on file  . Food insecurity - worry: Not on file  . Food insecurity - inability: Not on file  . Transportation needs - medical: Not on file  . Transportation needs - non-medical: Not on file  Occupational History  . Not on file  Tobacco Use  . Smoking status: Current Every Day Smoker    Packs/day: 1.00    Years: 40.00    Pack years: 40.00    Types: Cigarettes    Start date: 09/26/1976    Last attempt to quit: 09/26/2016    Years since quitting: 0.5  . Smokeless tobacco: Never Used  Substance and Sexual Activity  . Alcohol use: No  . Drug use: No  .  Sexual activity: Not on file  Other Topics Concern  . Not on file  Social History Narrative  . Not on file   Immunization History  Administered Date(s) Administered  . Influenza,inj,Quad PF,6+ Mos 02/09/2017  . Pneumococcal Polysaccharide-23 10/02/2016   Review of Systems  Constitutional: Positive for unexpected weight change (morbid obesity).  Eyes: Negative.   Respiratory: Positive for apnea and chest tightness.   Cardiovascular: Positive for leg swelling.  Gastrointestinal: Negative.   Endocrine: Negative.  Negative for cold intolerance, heat intolerance, polydipsia, polyphagia and polyuria.  Genitourinary: Negative.   Allergic/Immunologic: Negative.   Neurological: Negative.  Negative for  weakness and numbness.  Hematological: Negative.        Objective:   Physical Exam  Constitutional: He is oriented to person, place, and time.  Morbid obesit   HENT:  Head: Normocephalic and atraumatic.  Right Ear: External ear normal.  Left Ear: External ear normal.  Nose: Nose normal.  Mouth/Throat: Oropharynx is clear and moist.  Eyes: Conjunctivae and EOM are normal. Pupils are equal, round, and reactive to light.  Neck: Normal range of motion. Neck supple.  Cardiovascular: Normal rate, regular rhythm, normal heart sounds and intact distal pulses.  No murmur heard. Pulmonary/Chest: Effort normal. He has decreased breath sounds. He has no wheezes.  Abdominal: Soft. Bowel sounds are normal.  Increased abdominal girth  Musculoskeletal: Normal range of motion.  Neurological: He is alert and oriented to person, place, and time. He has normal reflexes.  Skin: Skin is warm and dry.  Psychiatric: He has a normal mood and affect. His behavior is normal. Judgment and thought content normal.        BP 121/75 (BP Location: Right Arm, Patient Position: Sitting, Cuff Size: Large)   Pulse 65   Temp 98.4 F (36.9 C) (Oral)   Resp 18   Ht 5\' 10"  (1.778 m)   Wt (!) 367 lb (166.5 kg)   SpO2 97%   BMI 52.66 kg/m  Assessment & Plan:  1. Chronic obstructive pulmonary disease, unspecified COPD type (HCC) I suspect that Timothy Thomas has worsening COPD. He is morbidly obese and is a chronic everyday smoker. He is experiencing further shortness of breath. Recommend that patient decrease smoking.  We will follow-up on recent referral to pulmonology. We will start a trial of prednisone 40 mg daily for 5 days.  Will also add Advair to daily regimen. Timothy Thomas warrants a referral to pulmonology for further workup and evaluation.   - Fluticasone-Salmeterol (ADVAIR) 100-50 MCG/DOSE AEPB; Inhale 1 puff 2 (two) times daily into the lungs.  Dispense: 1 each; Refill: 3 - predniSONE (DELTASONE) 20 MG  tablet; Take 2 tablets (40 mg total) daily with breakfast for 5 days by mouth.  Dispense: 10 tablet; Refill: 0 - albuterol (PROVENTIL) (2.5 MG/3ML) 0.083% nebulizer solution 2.5 mg - ipratropium (ATROVENT) nebulizer solution 0.5 mg  2. Cough, persistent  - Fluticasone-Salmeterol (ADVAIR) 100-50 MCG/DOSE AEPB; Inhale 1 puff 2 (two) times daily into the lungs.  Dispense: 1 each; Refill: 3 - predniSONE (DELTASONE) 20 MG tablet; Take 2 tablets (40 mg total) daily with breakfast for 5 days by mouth.  Dispense: 10 tablet; Refill: 0 - albuterol (PROVENTIL) (2.5 MG/3ML) 0.083% nebulizer solution 2.5 mg - ipratropium (ATROVENT) nebulizer solution 0.5 mg  3. Chronic anticoagulation - warfarin (COUMADIN) 5 MG tablet; Take 10 mg daily by mouth. - albuterol (PROVENTIL) (2.5 MG/3ML) 0.083% nebulizer solution 2.5 mg - ipratropium (ATROVENT) nebulizer solution 0.5 mg   RTC: Will  follow up in 3 months for chronic conditions   Nitish Roes Rennis PettyMoore Alf Doyle  MSN, FNP-C [facility] [address]  [place], [place] [address] [phone]

## 2017-04-27 ENCOUNTER — Other Ambulatory Visit: Payer: Self-pay | Admitting: *Deleted

## 2017-04-27 MED ORDER — RIVAROXABAN 20 MG PO TABS: 20.0000 mg | ORAL_TABLET | Freq: Every day | ORAL | 3 refills | Status: DC

## 2017-04-27 NOTE — Telephone Encounter (Signed)
PRINTED FOR PASS PROGRAM 

## 2017-05-07 ENCOUNTER — Other Ambulatory Visit: Payer: Self-pay

## 2017-05-07 ENCOUNTER — Emergency Department (HOSPITAL_COMMUNITY): Payer: Self-pay

## 2017-05-07 ENCOUNTER — Emergency Department (HOSPITAL_COMMUNITY)
Admission: EM | Admit: 2017-05-07 | Discharge: 2017-05-07 | Disposition: A | Discharge status: Home / Self Care | Payer: Self-pay | Attending: Emergency Medicine | Admitting: Emergency Medicine

## 2017-05-07 ENCOUNTER — Encounter (HOSPITAL_COMMUNITY): Payer: Self-pay

## 2017-05-07 ENCOUNTER — Ambulatory Visit: Payer: Self-pay | Admitting: Family Medicine

## 2017-05-07 DIAGNOSIS — J441 Chronic obstructive pulmonary disease with (acute) exacerbation: Secondary | ICD-10-CM | POA: Insufficient documentation

## 2017-05-07 DIAGNOSIS — Z79899 Other long term (current) drug therapy: Secondary | ICD-10-CM | POA: Insufficient documentation

## 2017-05-07 DIAGNOSIS — Z950 Presence of cardiac pacemaker: Secondary | ICD-10-CM | POA: Insufficient documentation

## 2017-05-07 DIAGNOSIS — F17210 Nicotine dependence, cigarettes, uncomplicated: Secondary | ICD-10-CM | POA: Insufficient documentation

## 2017-05-07 DIAGNOSIS — Z7901 Long term (current) use of anticoagulants: Secondary | ICD-10-CM | POA: Insufficient documentation

## 2017-05-07 HISTORY — DX: Acute myocardial infarction, unspecified: I21.9

## 2017-05-07 LAB — BASIC METABOLIC PANEL
ANION GAP: 7 (ref 5–15)
BUN: 13 mg/dL (ref 6–20)
CO2: 25 mmol/L (ref 22–32)
Calcium: 9.2 mg/dL (ref 8.9–10.3)
Chloride: 107 mmol/L (ref 101–111)
Creatinine, Ser: 0.7 mg/dL (ref 0.61–1.24)
GFR calc Af Amer: >60 mL/min (ref >60)
GFR calc non Af Amer: >60 mL/min (ref >60)
GLUCOSE: 110 mg/dL — AB (ref 65–99)
POTASSIUM: 3.7 mmol/L (ref 3.5–5.1)
Sodium: 139 mmol/L (ref 135–145)

## 2017-05-07 LAB — CBC
HEMATOCRIT: 40.1 % (ref 39.0–52.0)
HEMOGLOBIN: 12.8 g/dL — AB (ref 13.0–17.0)
MCH: 28.2 pg (ref 26.0–34.0)
MCHC: 31.9 g/dL (ref 30.0–36.0)
MCV: 88.3 fL (ref 78.0–100.0)
Platelets: 209 10*3/uL (ref 150–400)
RBC: 4.54 MIL/uL (ref 4.22–5.81)
RDW: 14.3 % (ref 11.5–15.5)
WBC: 10.2 10*3/uL (ref 4.0–10.5)

## 2017-05-07 LAB — I-STAT TROPONIN, ED: Troponin i, poc: 0 ng/mL (ref 0.00–0.08)

## 2017-05-07 LAB — PROTIME-INR
INR: 2.75
Prothrombin Time: 28.9 seconds — ABNORMAL HIGH (ref 11.4–15.2)

## 2017-05-07 LAB — D-DIMER, QUANTITATIVE: D-Dimer, Quant: 0.49 ug/mL-FEU (ref 0.00–0.50)

## 2017-05-07 MED ORDER — PREDNISONE 10 MG PO TABS: 20.0000 mg | ORAL_TABLET | Freq: Every day | ORAL | 0 refills | Status: DC

## 2017-05-07 MED ORDER — METHYLPREDNISOLONE SODIUM SUCC 125 MG IJ SOLR
125.0000 mg | Freq: Once | INTRAMUSCULAR | Status: AC
  Administered 2017-05-07: 125 mg via INTRAVENOUS
  Filled 2017-05-07: qty 2

## 2017-05-07 MED ORDER — ALBUTEROL SULFATE (2.5 MG/3ML) 0.083% IN NEBU
2.5000 mg | INHALATION_SOLUTION | Freq: Once | RESPIRATORY_TRACT | Status: AC
  Administered 2017-05-07: 2.5 mg via RESPIRATORY_TRACT
  Filled 2017-05-07: qty 3

## 2017-05-07 MED ORDER — IPRATROPIUM-ALBUTEROL 0.5-2.5 (3) MG/3ML IN SOLN
3.0000 mL | Freq: Once | RESPIRATORY_TRACT | Status: AC
  Administered 2017-05-07: 3 mL via RESPIRATORY_TRACT
  Filled 2017-05-07: qty 3

## 2017-05-07 MED ORDER — ALBUTEROL SULFATE (2.5 MG/3ML) 0.083% IN NEBU
5.0000 mg | INHALATION_SOLUTION | Freq: Once | RESPIRATORY_TRACT | Status: AC
  Administered 2017-05-07: 5 mg via RESPIRATORY_TRACT
  Filled 2017-05-07: qty 6

## 2017-05-07 MED ORDER — ALBUTEROL SULFATE (2.5 MG/3ML) 0.083% IN NEBU: 2.5000 mg | INHALATION_SOLUTION | Freq: Four times a day (QID) | RESPIRATORY_TRACT | 0 refills | Status: DC | PRN

## 2017-05-07 NOTE — ED Provider Notes (Signed)
[facility]-EMERGENCY DEPT Provider Note   CSN: [id number] Arrival date & time: 05/07/17  1323     History   Chief Complaint Chief Complaint  Patient presents with  . Shortness of Breath  . Chest Pain    HPI Timothy Thomas is a 51 y.o. male.  Patient complains of shortness of breath and cough.  He has a history of PE and his doctor sent him over here to make sure he did not have another PE.  He has been treated for COPD and bronchitis   The history is provided by the patient.  Shortness of Breath  This is a new problem. The problem occurs continuously.The current episode started 2 days ago. The problem has not changed since onset.Associated symptoms include chest pain. Pertinent negatives include no headaches, no ear pain, no cough, no abdominal pain and no rash.  Chest Pain   Associated symptoms include shortness of breath. Pertinent negatives include no abdominal pain, no back pain, no cough and no headaches.  Pertinent negatives for past medical history include no seizures.    Past Medical History:  Diagnosis Date  . Aphasia    Transient - negative head CT and EEG with neurology workup February 2017 [facility]  . COPD (chronic obstructive pulmonary disease) (HCC)   . Glaucoma   . Macular degeneration   . Myocardial infarction (HCC)   . Obesity   . Obstructive sleep apnea   . Tachycardia-bradycardia syndrome (HCC)    Status post pacemaker  . Variant angina (HCC)    History of normal coronary arteries at cardiac catheterization 2002 - vasospasm noted  . Vasovagal syncope     Patient Active Problem List   Diagnosis Date Noted  . Chronic obstructive pulmonary disease (HCC) 03/25/2017  . Syncope 09/26/2016  . Morbid obesity (HCC) 09/26/2016  . Acute pulmonary embolism (HCC) 09/26/2016    Past Surgical History:  Procedure Laterality Date  . LEFT HEART CATH AND CORONARY ANGIOGRAPHY N/A 09/28/2016   Procedure: Left Heart Cath and Coronary  Angiography;  Surgeon: Orpah CobbAjay Kadakia, MD;  Location: [facility] INVASIVE CV LAB;  Service: Cardiovascular;  Laterality: N/A;  . PACEMAKER INSERTION         Home Medications    Prior to Admission medications   Medication Sig Start Date End Date Taking? Authorizing Provider  atorvastatin (LIPITOR) 40 MG tablet Take 1 tablet (40 mg total) by mouth daily at 6 PM. 09/29/16  Yes Orpah CobbKadakia, Ajay, MD  diphenhydramine-acetaminophen (TYLENOL PM) 25-500 MG TABS tablet Take 3 tablets by mouth at bedtime.    Yes [provider]  Docusate Sodium (COLACE PO) Take by mouth.   Yes [provider]  Fluticasone-Salmeterol (ADVAIR) 100-50 MCG/DOSE AEPB Inhale 1 puff 2 (two) times daily into the lungs. 04/14/17  Yes Massie MaroonHollis, Lachina M, FNP  potassium chloride (K-DUR) 10 MEQ tablet Take 1 tablet (10 mEq total) by mouth daily. 10/02/16  Yes Massie MaroonHollis, Lachina M, FNP  warfarin (COUMADIN) 5 MG tablet Take 5-10 mg by mouth daily.    Yes [provider]  albuterol (PROVENTIL) (2.5 MG/3ML) 0.083% nebulizer solution Take 3 mLs (2.5 mg total) by nebulization every 6 (six) hours as needed for wheezing or shortness of breath. 05/07/17   Bethann BerkshireZammit, Natascha Edmonds, MD  ferrous sulfate 325 (65 FE) MG tablet Take 1 tablet (325 mg total) by mouth daily with breakfast. Patient not taking: Reported on 02/09/2017 10/02/16   Massie MaroonHollis, Lachina M, FNP  predniSONE (DELTASONE) 10 MG tablet Take 2 tablets (  20 mg total) by mouth daily. 05/07/17   Bethann Berkshire, MD  rivaroxaban (XARELTO) 20 MG TABS tablet Take 1 tablet (20 mg total) by mouth daily with supper. Patient not taking: Reported on 05/07/2017 04/27/17   Quentin Angst, MD    Family History Family History  Problem Relation Age of Onset  . Hypertension Mother   . Diabetes Father   . Hypertension Father   . Stroke Father     Social History Social History   Tobacco Use  . Smoking status: Current Every Day Smoker    Packs/day: 1.00    Years: 40.00    Pack years: 40.00     Types: Cigarettes    Start date: 09/26/1976    Last attempt to quit: 09/26/2016    Years since quitting: 0.6  . Smokeless tobacco: Never Used  Substance Use Topics  . Alcohol use: No  . Drug use: No     Allergies   Codeine and Ketorolac   Review of Systems Review of Systems  Constitutional: Negative for appetite change and fatigue.  HENT: Negative for congestion, ear discharge, ear pain and sinus pressure.   Eyes: Negative for discharge.  Respiratory: Positive for shortness of breath. Negative for cough.   Cardiovascular: Positive for chest pain.  Gastrointestinal: Negative for abdominal pain and diarrhea.  Genitourinary: Negative for frequency and hematuria.  Musculoskeletal: Negative for back pain.  Skin: Negative for rash.  Neurological: Negative for seizures and headaches.  Psychiatric/Behavioral: Negative for hallucinations.     Physical Exam Updated Vital Signs BP (!) 113/57   Pulse (!) 58   Temp 97.6 F (36.4 C) (Oral)   Resp 19   Ht 5\' 10"  (1.778 m)   Wt (!) 166.2 kg (366 lb 6 oz)   SpO2 96%   BMI 52.57 kg/m   Physical Exam  Constitutional: He is oriented to person, place, and time. He appears well-developed.  HENT:  Head: Normocephalic.  Eyes: Conjunctivae and EOM are normal. No scleral icterus.  Neck: Neck supple. No thyromegaly present.  Cardiovascular: Normal rate and regular rhythm. Exam reveals no gallop and no friction rub.  No murmur heard. Pulmonary/Chest: No stridor. He has wheezes. He has no rales. He exhibits no tenderness.  Abdominal: He exhibits no distension. There is no tenderness. There is no rebound.  Musculoskeletal: Normal range of motion. He exhibits no edema.  Lymphadenopathy:    He has no cervical adenopathy.  Neurological: He is oriented to person, place, and time. He exhibits normal muscle tone. Coordination normal.  Skin: No rash noted. No erythema.  Psychiatric: He has a normal mood and affect. His behavior is normal.      ED Treatments / Results  Labs (all labs ordered are listed, but only abnormal results are displayed) Labs Reviewed  BASIC METABOLIC PANEL - Abnormal; Notable for the following components:      Result Value   Glucose, Bld 110 (*)    All other components within normal limits  CBC - Abnormal; Notable for the following components:   Hemoglobin 12.8 (*)    All other components within normal limits  PROTIME-INR - Abnormal; Notable for the following components:   Prothrombin Time 28.9 (*)    All other components within normal limits  D-DIMER, QUANTITATIVE (NOT AT [facility])  I-STAT TROPONIN, ED    EKG  EKG Interpretation  Date/Time:  Friday May 07 2017 13:36:14 EST Ventricular Rate:  63 PR Interval:    QRS Duration: 115 QT Interval:  437 QTC Calculation: 448 R Axis:   -24 Text Interpretation:  Sinus rhythm Nonspecific intraventricular conduction delay Low voltage, precordial leads Baseline wander in lead(s) V3 V4 V5 V6 Confirmed by Bethann Berkshire [phone]) on 05/07/2017 8:21:37 PM       Radiology Dg Chest 2 View  Result Date: 05/07/2017 CLINICAL DATA:  Nonproductive cough and progressive shortness of breath for 2 weeks. EXAM: CHEST  2 VIEW COMPARISON:  05/06/2017 and 11/05/2016 and chest CT dated 03/31/2017 FINDINGS: The heart size and mediastinal contours are within normal limits. Both lungs are clear. The visualized skeletal structures are unremarkable. Dual lead pacemaker in place, unchanged. IMPRESSION: No active cardiopulmonary disease. Electronically Signed   By: Francene Boyers M.D.   On: 05/07/2017 14:17    Procedures Procedures (including critical care time)  Medications Ordered in ED Medications  albuterol (PROVENTIL) (2.5 MG/3ML) 0.083% nebulizer solution 5 mg (5 mg Nebulization Given 05/07/17 1347)  methylPREDNISolone sodium succinate (SOLU-MEDROL) 125 mg/2 mL injection 125 mg (125 mg Intravenous Given 05/07/17 1850)  ipratropium-albuterol (DUONEB) 0.5-2.5 (3)  MG/3ML nebulizer solution 3 mL (3 mLs Nebulization Given 05/07/17 1838)  albuterol (PROVENTIL) (2.5 MG/3ML) 0.083% nebulizer solution 2.5 mg (2.5 mg Nebulization Given 05/07/17 1838)     Initial Impression / Assessment and Plan / ED Course  I have reviewed the triage vital signs and the nursing notes.  Pertinent labs & imaging results that were available during my care of the patient were reviewed by me and considered in my medical decision making (see chart for details).     Patient with exacerbation of COPD.  Patient will be placed on albuterol neb treatments and prednisone and follow-up with his PCP Final Clinical Impressions(s) / ED Diagnoses   Final diagnoses:  COPD exacerbation [facility])    ED Discharge Orders        Ordered    albuterol (PROVENTIL) (2.5 MG/3ML) 0.083% nebulizer solution  Every 6 hours PRN     05/07/17 2033    predniSONE (DELTASONE) 10 MG tablet  Daily     05/07/17 2033       Bethann Berkshire, MD 05/07/17 2041

## 2017-05-07 NOTE — ED Notes (Signed)
ED Provider at bedside. 

## 2017-05-07 NOTE — Discharge Instructions (Signed)
Follow-up with your doctor next week for recheck.  Use the albuterol neb machine every 6 hours if needed

## 2017-05-07 NOTE — ED Triage Notes (Addendum)
Patient c/o a non productive cough and progressive SOB x 2 weeks. Patient reports a chest x-ray at [facility] and was told it was negative. Patient has a history of a recent PE, an MI, and COPD. Patient reports using his albuterol inhaler with no rellief.  Patient c/o "chest tightness" when writer went to give albuterol neb.

## 2017-05-12 ENCOUNTER — Telehealth: Payer: Self-pay | Admitting: *Deleted

## 2017-05-12 NOTE — Telephone Encounter (Signed)
Received request for Medical records from [place] [employer], forwarded to [place] for email/scan/SLS 12/05    

## 2017-05-24 ENCOUNTER — Ambulatory Visit: Payer: Self-pay | Admitting: Family Medicine

## 2017-05-27 ENCOUNTER — Other Ambulatory Visit: Payer: Self-pay | Admitting: Family Medicine

## 2017-05-27 DIAGNOSIS — J449 Chronic obstructive pulmonary disease, unspecified: Secondary | ICD-10-CM

## 2017-05-27 MED ORDER — ALBUTEROL SULFATE (2.5 MG/3ML) 0.083% IN NEBU: 2.5000 mg | INHALATION_SOLUTION | Freq: Four times a day (QID) | RESPIRATORY_TRACT | 2 refills | Status: DC | PRN

## 2017-05-28 ENCOUNTER — Ambulatory Visit (INDEPENDENT_AMBULATORY_CARE_PROVIDER_SITE_OTHER): Payer: Self-pay | Admitting: Pulmonary Disease

## 2017-05-28 ENCOUNTER — Ambulatory Visit (INDEPENDENT_AMBULATORY_CARE_PROVIDER_SITE_OTHER)
Admission: RE | Admit: 2017-05-28 | Discharge: 2017-05-28 | Disposition: A | Discharge status: Home / Self Care | Payer: Self-pay | Source: Ambulatory Visit | Attending: Pulmonary Disease | Admitting: Pulmonary Disease

## 2017-05-28 ENCOUNTER — Encounter: Payer: Self-pay | Admitting: Pulmonary Disease

## 2017-05-28 VITALS — BP 130/78 | HR 68 | Ht 70.0 in | Wt 371.4 lb

## 2017-05-28 DIAGNOSIS — R053 Chronic cough: Secondary | ICD-10-CM

## 2017-05-28 DIAGNOSIS — G4733 Obstructive sleep apnea (adult) (pediatric): Secondary | ICD-10-CM

## 2017-05-28 DIAGNOSIS — R05 Cough: Secondary | ICD-10-CM

## 2017-05-28 DIAGNOSIS — J449 Chronic obstructive pulmonary disease, unspecified: Secondary | ICD-10-CM

## 2017-05-28 DIAGNOSIS — R0602 Shortness of breath: Secondary | ICD-10-CM

## 2017-05-28 DIAGNOSIS — I2699 Other pulmonary embolism without acute cor pulmonale: Secondary | ICD-10-CM

## 2017-05-28 DIAGNOSIS — Z9989 Dependence on other enabling machines and devices: Secondary | ICD-10-CM

## 2017-05-28 MED ORDER — UMECLIDINIUM-VILANTEROL 62.5-25 MCG/INH IN AEPB: 1.0000 | INHALATION_SPRAY | Freq: Every day | RESPIRATORY_TRACT | 0 refills | Status: DC

## 2017-05-28 MED ORDER — PROMETHAZINE-CODEINE 6.25-10 MG/5ML PO SYRP: 5.0000 mL | ORAL_SOLUTION | Freq: Four times a day (QID) | ORAL | 0 refills | Status: DC | PRN

## 2017-05-28 MED ORDER — PANTOPRAZOLE SODIUM 40 MG PO TBEC: 40.0000 mg | DELAYED_RELEASE_TABLET | Freq: Every day | ORAL | 0 refills | Status: DC

## 2017-05-28 NOTE — Assessment & Plan Note (Signed)
Sample of ANORO once daily to take instead of Advair.  Smoking cessation was emphasized

## 2017-05-28 NOTE — Patient Instructions (Signed)
Cough may be related to smoking, sinus drip or reflux.  Take chlorpheniramine 8 mg at bedtime for 4 weeks. Take store brand Sudafed (eg walphed) once daily in the daytime for 4 weeks. Protonix 40 mg daily for reflux for 4 weeks  Sample of ANORO once daily to take instead of Advair.  Prescription for codeine cough syrup 5 mL at bedtime #120 ml

## 2017-05-28 NOTE — Assessment & Plan Note (Signed)
Continue Coumadin as advised by hematology  can refer him to  Coumadin clinic for monitoring

## 2017-05-28 NOTE — Progress Notes (Signed)
Subjective:    Patient ID: Timothy Thomas, male    DOB: Jul 25, 1965, 51 y.o.   MRN: [id number]  HPI  Chief Complaint  Patient presents with  . Advice Only    Referred by Julianne HandlerLachina Hollis. Pt had a heart attack and PE in April 2018. States that SOB became real bad beginning of September 2018 and another PE was found beginning of October 2018. Pt also states that he is weak, coughing occ.Denies any CP.    51 year old obese smoker presents for evaluation of chronic cough and pulmonary embolism. He also has a right lower lobe pulmonary nodule noted since 09/2016.  He smoked about a pack per day for more than 35 pack years and has recently cut down to 1/2 pack/day.  He was hospitalized 09/2016 with a pulmonary embolism RV/LV ratio 4.2 and a non-STEMI, seen by Dr. Algie Cofferkadakia.  Echo 09/2016 showed EF of 45-50% with grade 1 diastolic function RVSP of 33.  Venous duplex was negative.  He was discharged on Xarelto but unfortunately developed another segmental PE in the left lower lobe in 03/2017 while on Xarelto.  [facility] and was felt that because of his obesity Xarelto was not the right medication for him and he was switched to Coumadin which was titrated to 10 mg alternating with 5 mg. He was checked for thrombophilia and factor V and  prothrombin gene mutation was negative while on  Xarelto, he was heterozygous for MT HFR.  He has a pacemaker was inserted in 19 9410-second bradycardia and has now run out of batteries. He reports cough with an insidious onset in 02/2017 without preceding URI.  He has tried Symbicort for this without benefit, then was placed on Advair and does not feel that this is working needed.  He uses albuterol nebs with seems to help the most and at times he has to use about 3-4 times per day.  Have reviewed all his CT images from PACS.   Significant tests/ events reviewed  Spirometry today shows moderate restriction with ratio of 83, FEV1  of 70% and FVC of 65%. CT Angie of 09/2016 shows right lower lobe 1.0 cm nodule that is stable on follow-up CT in 03/2017, left lower lobe nodule is calcified and noted to be present in the past       Past Medical History:  Diagnosis Date  . Aphasia    Transient - negative head CT and EEG with neurology workup February 2017 [facility]  . COPD (chronic obstructive pulmonary disease) (HCC)   . Glaucoma   . Macular degeneration   . Myocardial infarction (HCC)   . Obesity   . Obstructive sleep apnea   . Tachycardia-bradycardia syndrome (HCC)    Status post pacemaker  . Variant angina (HCC)    History of normal coronary arteries at cardiac catheterization 2002 - vasospasm noted  . Vasovagal syncope     Past Surgical History:  Procedure Laterality Date  . LEFT HEART CATH AND CORONARY ANGIOGRAPHY N/A 09/28/2016   Procedure: Left Heart Cath and Coronary Angiography;  Surgeon: Orpah CobbAjay Kadakia, MD;  Location: [facility] INVASIVE CV LAB;  Service: Cardiovascular;  Laterality: N/A;  . PACEMAKER INSERTION      Allergies  Allergen Reactions  . Codeine Nausea And Vomiting    Low tolerance to narcotics  . Ketorolac Nausea And Vomiting    Vomiting       Social History   Socioeconomic History  . Marital status: Married  Spouse name: Not on file  . Number of children: Not on file  . Years of education: Not on file  . Highest education level: Not on file  Social Needs  . Financial resource strain: Not on file  . Food insecurity - worry: Not on file  . Food insecurity - inability: Not on file  . Transportation needs - medical: Not on file  . Transportation needs - non-medical: Not on file  Occupational History  . Not on file  Tobacco Use  . Smoking status: Current Every Day Smoker    Packs/day: 1.00    Years: 40.00    Pack years: 40.00    Types: Cigarettes    Start date: 09/26/1976  . Smokeless tobacco: Never Used  . Tobacco comment: currently smoking 0.5ppd as of 05/28/17  ep    Substance and Sexual Activity  . Alcohol use: No  . Drug use: No  . Sexual activity: Not on file  Other Topics Concern  . Not on file  Social History Narrative  . Not on file      Family History  Problem Relation Age of Onset  . Hypertension Mother   . Diabetes Father   . Hypertension Father   . Stroke Father      Review of Systems  Constitutional: Negative for fever and unexpected weight change.  HENT: Positive for congestion. Negative for dental problem, ear pain, nosebleeds, postnasal drip, rhinorrhea, sinus pressure, sneezing, sore throat and trouble swallowing.   Eyes: Negative for redness and itching.  Respiratory: Positive for cough, chest tightness, shortness of breath and wheezing.   Cardiovascular: Negative for palpitations and leg swelling.  Gastrointestinal: Positive for nausea and vomiting.  Genitourinary: Negative for dysuria.  Musculoskeletal: Negative for joint swelling.  Skin: Negative for rash.  Allergic/Immunologic: Negative.  Negative for environmental allergies, food allergies and immunocompromised state.  Neurological: Negative for headaches.  Hematological: Bruises/bleeds easily.  Psychiatric/Behavioral: Negative for dysphoric mood. The patient is not nervous/anxious.        Objective:   Physical Exam  Gen. Pleasant, obese, in no distress, normal affect, paroxysms of coughing ENT - no lesions, no post nasal drip, class 3 airway Neck: No JVD, no thyromegaly, no carotid bruits Lungs: no use of accessory muscles, no dullness to percussion, decreased without rales or rhonchi  Cardiovascular: Rhythm regular, heart sounds  normal, no murmurs or gallops, no peripheral edema Abdomen: soft and non-tender, no hepatosplenomegaly, BS normal. Musculoskeletal: No deformities, no cyanosis or clubbing Neuro:  alert, non focal, no tremors       Assessment & Plan:

## 2017-05-28 NOTE — Assessment & Plan Note (Signed)
He would like me to take over care for this. He will bring his sleep studies and CPAP machine with him on his next visit

## 2017-05-28 NOTE — Assessment & Plan Note (Signed)
Cough may be related to smoking, sinus drip or reflux.  Take chlorpheniramine 8 mg at bedtime for 4 weeks. Take store brand Sudafed (eg walphed) once daily in the daytime for 4 weeks. Protonix 40 mg daily for reflux for 4 weeks   Prescription for codeine cough syrup 5 mL at bedtime #120 ml

## 2017-06-08 DIAGNOSIS — I219 Acute myocardial infarction, unspecified: Secondary | ICD-10-CM

## 2017-06-08 HISTORY — DX: Acute myocardial infarction, unspecified: I21.9

## 2017-06-09 ENCOUNTER — Ambulatory Visit (INDEPENDENT_AMBULATORY_CARE_PROVIDER_SITE_OTHER): Payer: Self-pay | Admitting: Pulmonary Disease

## 2017-06-09 ENCOUNTER — Encounter: Payer: Self-pay | Admitting: Pulmonary Disease

## 2017-06-09 DIAGNOSIS — R053 Chronic cough: Secondary | ICD-10-CM

## 2017-06-09 DIAGNOSIS — R05 Cough: Secondary | ICD-10-CM

## 2017-06-09 DIAGNOSIS — J441 Chronic obstructive pulmonary disease with (acute) exacerbation: Secondary | ICD-10-CM

## 2017-06-09 DIAGNOSIS — Z720 Tobacco use: Secondary | ICD-10-CM | POA: Insufficient documentation

## 2017-06-09 DIAGNOSIS — J449 Chronic obstructive pulmonary disease, unspecified: Secondary | ICD-10-CM

## 2017-06-09 DIAGNOSIS — R911 Solitary pulmonary nodule: Secondary | ICD-10-CM | POA: Insufficient documentation

## 2017-06-09 MED ORDER — UMECLIDINIUM-VILANTEROL 62.5-25 MCG/INH IN AEPB: 1.0000 | INHALATION_SPRAY | Freq: Every day | RESPIRATORY_TRACT | 0 refills | Status: DC

## 2017-06-09 MED ORDER — HYDROCOD POLST-CPM POLST ER 10-8 MG/5ML PO SUER: 5.0000 mL | Freq: Every evening | ORAL | 0 refills | Status: DC | PRN

## 2017-06-09 MED ORDER — ALBUTEROL SULFATE (2.5 MG/3ML) 0.083% IN NEBU: 2.5000 mg | INHALATION_SOLUTION | Freq: Four times a day (QID) | RESPIRATORY_TRACT | 1 refills | Status: DC | PRN

## 2017-06-09 MED ORDER — ALBUTEROL SULFATE HFA 108 (90 BASE) MCG/ACT IN AERS: 2.0000 | INHALATION_SPRAY | Freq: Four times a day (QID) | RESPIRATORY_TRACT | 6 refills | Status: DC | PRN

## 2017-06-09 NOTE — Patient Instructions (Signed)
Sample of Anoro -if you remain symptomatic in 2 weeks, will change to Trelegy  Prescription for Tussionex 5 mL at bedtime # 120 ml  Trial of nicotine patch 21 mg daily,  If that does not work call us back for a prescription for Nicotrol inhaler

## 2017-06-09 NOTE — Addendum Note (Signed)
Addended by: Maurene Capes on: 06/09/2017 10:13 AM   Modules accepted: Orders

## 2017-06-09 NOTE — Assessment & Plan Note (Signed)
Right lower lobe nodule stable since 09/2016. Will need repeat CT chest in June 2019

## 2017-06-09 NOTE — Assessment & Plan Note (Addendum)
Although technically  does not have airway obstruction, will treat as such given recent episodes of bronchitis  Sample of Anoro -if you remain symptomatic in 2 weeks, will change to Trelegy Refills will be provided on his albuterol nebs and MDI  Will also extend current course of prednisone beyond 2 weeks if he has persistent symptoms

## 2017-06-09 NOTE — Assessment & Plan Note (Signed)
Clearly smoking cessation paramount.  This was again emphasized Trial of nicotine patch 21 mg daily,  If that does not work call us back for a prescription for Nicotrol inhaler

## 2017-06-09 NOTE — Assessment & Plan Note (Signed)
Prescription for Tussionex 5 mL at bedtime # 120 ml

## 2017-06-09 NOTE — Addendum Note (Signed)
Addended by: Maurene Capes on: 06/09/2017 10:31 AM   Modules accepted: Orders

## 2017-06-09 NOTE — Progress Notes (Addendum)
Subjective:    Patient ID: Timothy Thomas, male    DOB: 1965/12/23, 52 y.o.   MRN: [id number]  HPI  52 year old obese smoker  for FU of chronic cough and pulmonary embolism. He also has a right lower lobe pulmonary nodule noted since 09/2016.  He smoked about a pack per day for more than 35 pack years and has recently cut down to 1/2 pack/day.  He was hospitalized 09/2016 with a pulmonary embolism RV/LV ratio 4.2 and a non-STEMI, seen by Dr. Algie Coffer.  Echo 09/2016 showed EF of 45-50% with grade 1 diastolic function RVSP of 33.  Venous duplex was negative.  He was discharged on Xarelto but unfortunately developed another segmental PE in the left lower lobe in 03/2017 while on Xarelto.  He was evaluated by hematology at [facility] and was felt that because of his obesity Xarelto was not the right medication for him and he was switched to Coumadin which was titrated to 10 mg alternating with 5 mg. He was checked for thrombophilia and factor V and  prothrombin gene mutation was negative while on  Xarelto, he was heterozygous for MT HFR.  He has a non functional pacemaker inserted in 1994  for a 10-second bradycardia  He reports cough with an insidious onset in 02/2017 without preceding URI.    On his last visit with [place] on 12/21, he was given a sample of Anoro since Symbicort and Advair has not worked in the past. He was also given codeine cough syrup for severe cough Unfortunately a week after this visit he was hospitalized at [facility] overnight and treated for COPD exacerbation with steroids and antibiotics CT angiogram was obtained which I have reviewed and showed stable right lower lobe 9 mm nodule and no central blood clots.  Faint opacity left upper lobe suggesting infectious etiology.  INR was 2.0 and ABG was 7.42/42/58 he was discharged on a 2-week course of prednisone He reports persistent cough, codeine cough syrup is not working and he still wakes  He has been able to cut down  smoking to 1/2 pack/day   Significant tests/ events reviewed  Spirometry 05/2017 >>moderate restriction with ratio of 83, FEV1 of 70% and FVC of 65%.  CT Angio of 09/2016 shows right lower lobe 1.0 cm nodule that is stable on follow-up CT in 03/2017, left lower lobe nodule is calcified and noted to be present in the past   Past Medical History:  Diagnosis Date  . Aphasia    Transient - negative head CT and EEG with neurology workup February 2017 [facility])  . COPD (chronic obstructive pulmonary disease) (HCC)   . Glaucoma   . Macular degeneration   . Myocardial infarction (HCC)   . Obesity   . Obstructive sleep apnea   . Tachycardia-bradycardia syndrome (HCC)    Status post pacemaker  . Variant angina (HCC)    History of normal coronary arteries at cardiac catheterization 2002 - vasospasm noted  . Vasovagal syncope      Review of Systems neg for any significant sore throat, dysphagia, itching, sneezing, nasal congestion or excess/ purulent secretions, fever, chills, sweats, unintended wt loss, pleuritic or exertional cp, hempoptysis, orthopnea pnd or change in chronic leg swelling.   Also denies presyncope, palpitations, heartburn, abdominal pain, nausea, vomiting, diarrhea or change in bowel or urinary habits, dysuria,hematuria, rash, arthralgias, visual complaints, headache, numbness weakness or ataxia.     Objective:   Physical Exam  Gen. Pleasant, obese, in no distress, normal  affect ENT - no lesions, no post nasal drip, class 2 airway Neck: No JVD, no thyromegaly, no carotid bruits Lungs: no use of accessory muscles, no dullness to percussion, decreased without rales or rhonchi  Cardiovascular: Rhythm regular, heart sounds  normal, no murmurs or gallops, no peripheral edema Abdomen: soft and non-tender, no hepatosplenomegaly, BS normal. Musculoskeletal: No deformities, no cyanosis or clubbing Neuro:  alert, non focal, no tremors       Assessment & Plan:

## 2017-06-16 ENCOUNTER — Telehealth: Payer: Self-pay | Admitting: Pulmonary Disease

## 2017-06-16 MED ORDER — PREDNISONE 10 MG PO TABS: ORAL_TABLET | ORAL | 0 refills | Status: DC

## 2017-06-16 NOTE — Telephone Encounter (Signed)
Called and spoke with pt.  Pt is requesting prednisone.  Pt reports of non prod cough & mild wheezing. Sob is baseline.  Pt states cough did slightly improved with 2 weeks of prednisone. Pt states he will complete prednisone tonight. Per pt he was instructed by RA to call with an update on symptoms this week, and if no better prednisone would be continued until next OV. Pt denies fever, chills, sweats or body ache.      RA please advise. Thanks.

## 2017-06-16 NOTE — Telephone Encounter (Signed)
° °  Prednisone 10 mg tabs Take 4 tabs  daily with food x 4 days, then 3 tabs daily x 4 days, then 2 tabs daily x 4 days, then 1 tab daily x4 days then stop. #40 ° °

## 2017-06-16 NOTE — Telephone Encounter (Signed)
Rx for prednisone has been sent to preferred pharmacy. Pt is aware and voiced his understanding.  Nothing further is needed.  

## 2017-06-25 ENCOUNTER — Ambulatory Visit: Payer: Self-pay | Admitting: Pulmonary Disease

## 2017-07-09 ENCOUNTER — Ambulatory Visit: Payer: Self-pay | Admitting: Pulmonary Disease

## 2017-07-09 ENCOUNTER — Ambulatory Visit (INDEPENDENT_AMBULATORY_CARE_PROVIDER_SITE_OTHER): Payer: Self-pay | Admitting: Adult Health

## 2017-07-09 ENCOUNTER — Encounter: Payer: Self-pay | Admitting: Adult Health

## 2017-07-09 DIAGNOSIS — I2699 Other pulmonary embolism without acute cor pulmonale: Secondary | ICD-10-CM

## 2017-07-09 DIAGNOSIS — J449 Chronic obstructive pulmonary disease, unspecified: Secondary | ICD-10-CM

## 2017-07-09 DIAGNOSIS — Z9989 Dependence on other enabling machines and devices: Secondary | ICD-10-CM

## 2017-07-09 DIAGNOSIS — G4733 Obstructive sleep apnea (adult) (pediatric): Secondary | ICD-10-CM

## 2017-07-09 DIAGNOSIS — R911 Solitary pulmonary nodule: Secondary | ICD-10-CM

## 2017-07-09 DIAGNOSIS — Z720 Tobacco use: Secondary | ICD-10-CM

## 2017-07-09 NOTE — Assessment & Plan Note (Signed)
Possible COPD (no obstruction on PFT ) in smoker -  Unable to afford inhaler. Does have subjective benefit from albuterol . Would like to decrease this use  Trial of neb lama   Plan  Patient Instructions  Work on not smoking .  Keep up the good work Try ipratropium nebulizer 3 times daily May use albuterol nebulizer as needed every 4-6 hours Continue to follow-up with hematology and remain on Coumadin until advised otherwise Follow-up for CT scan in April as planned Follow-up with Dr. Vassie Loll in 2 months after CT scan and as needed Please contact office for sooner follow up if symptoms do not improve or worsen or seek emergency care

## 2017-07-09 NOTE — Progress Notes (Signed)
@Patient  ID: Timothy Thomas, male    DOB: 02-Dec-1965, 52 y.o.   MRN: [id number]  Chief Complaint  Patient presents with  . Follow-up    COPD     Referring provider: Massie Maroon, FNP  HPI: 52 year old morbidly obese male active smoker followed for chronic cough, PE, lung nodule, and COPD  TEST  Significant tests/ events reviewed  Spirometry 05/2017 >>moderate restriction with ratio of 83, FEV1 of 70% and FVC of 65%.  CT Angio of 09/2016 shows right lower lobe 1.0 cm nodule that is stable on follow-up CT in 03/2017, left lower lobe nodule is calcified and noted to be present in the past  07/09/2017 Follow up : COPD , Cough , PE , lung nodule Patient returns for a one-month follow-up.  Patient was having ongoing shortness of breath and cough.  He was started on [facility] daily last visit.  Previous spirometry showed moderate restriction with no significant obstruction.  Patient was felt to have a component of COPD with his ongoing smoking.  Patient says the inhaler did help slightly but he does not have insurance and ran out of his sample..Feels the albuterol nebs help the most .  He has cut down on smoking and is actually not been smoking for the last 2 weeks smoking cessation was discussed Does not have any insurance .  Gets winded easily . Has intermittent dry cough . No fever chest pain or increased dyspnea.   Patient was diagnosed with a PE in April 2018.  He was initially on Xarelto but has been switched to Coumadin after new PE noted on CT chest in 01/2018 . He is following with hematology and currently being evaluated for possible recurrent clots and care everywhere notes recommend to change to hep injections but he can not afford due to no insurance .  He says he never misses a dose of anitcoagulation.    CT chest April 2018 showed a 1 cm lung nodule in the right lower lobe.  Patient has a follow-up CT chest April 2019.  Currently looking at disability .   Has OSA on  CPAP .    Allergies  Allergen Reactions  . Codeine Nausea And Vomiting    Low tolerance to narcotics  . Ketorolac Nausea And Vomiting    Vomiting      Immunization History  Administered Date(s) Administered  . Influenza,inj,Quad PF,6+ Mos 02/09/2017  . Pneumococcal Polysaccharide-23 10/02/2016    Past Medical History:  Diagnosis Date  . Aphasia    Transient - negative head CT and EEG with neurology workup February 2017 [facility])  . COPD (chronic obstructive pulmonary disease) (HCC)   . Glaucoma   . Macular degeneration   . Myocardial infarction (HCC)   . Obesity   . Obstructive sleep apnea   . Tachycardia-bradycardia syndrome (HCC)    Status post pacemaker  . Variant angina (HCC)    History of normal coronary arteries at cardiac catheterization 2002 - vasospasm noted  . Vasovagal syncope     Tobacco History: Social History   Tobacco Use  Smoking Status Former Smoker  . Packs/day: 1.00  . Years: 40.00  . Pack years: 40.00  . Types: Cigarettes  . Start date: 09/26/1976  . Last attempt to quit: 06/25/2017  . Years since quitting: 0.0  Smokeless Tobacco Never Used  Tobacco Comment   currently smoking 0.5ppd as of 05/28/17  ep   Counseling given: Not Answered Comment: currently smoking 0.5ppd as of 05/28/17  ep   Outpatient Encounter Medications as of 07/09/2017  Medication Sig  . albuterol (PROVENTIL HFA;VENTOLIN HFA) 108 (90 Base) MCG/ACT inhaler Inhale 2 puffs into the lungs every 6 (six) hours as needed for wheezing or shortness of breath.  Marland Kitchen albuterol (PROVENTIL) (2.5 MG/3ML) 0.083% nebulizer solution Take 3 mLs (2.5 mg total) by nebulization every 6 (six) hours as needed for wheezing or shortness of breath.  Marland Kitchen atorvastatin (LIPITOR) 40 MG tablet Take 1 tablet (40 mg total) by mouth daily at 6 PM.  . diphenhydramine-acetaminophen (TYLENOL PM) 25-500 MG TABS tablet Take 3 tablets by mouth at bedtime.   Tery Sanfilippo Sodium (COLACE PO) Take by mouth.  .  pantoprazole (PROTONIX) 40 MG tablet Take 1 tablet (40 mg total) by mouth daily.  . potassium chloride (K-DUR) 10 MEQ tablet Take 1 tablet (10 mEq total) by mouth daily.  Marland Kitchen warfarin (COUMADIN) 5 MG tablet Take 5-10 mg by mouth daily.   . chlorpheniramine-HYDROcodone (TUSSIONEX PENNKINETIC ER) 10-8 MG/5ML SUER Take 5 mLs by mouth at bedtime as needed for cough. (Patient not taking: Reported on 07/09/2017)  . umeclidinium-vilanterol (ANORO ELLIPTA) 62.5-25 MCG/INH AEPB Inhale 1 puff into the lungs daily. (Patient not taking: Reported on 07/09/2017)  . umeclidinium-vilanterol (ANORO ELLIPTA) 62.5-25 MCG/INH AEPB Inhale 1 puff into the lungs daily. (Patient not taking: Reported on 07/09/2017)  . [DISCONTINUED] predniSONE (DELTASONE) 10 MG tablet 4 tabs x 4 days with food, 3 tabs x 4 days, 2 tabs x4 days, 1 tabs x4 days then stop (Patient not taking: Reported on 07/09/2017)   No facility-administered encounter medications on file as of 07/09/2017.      Review of Systems  Constitutional:   No  weight loss, night sweats,  Fevers, chills, + fatigue, or  lassitude.  HEENT:   No headaches,  Difficulty swallowing,  Tooth/dental problems, or  Sore throat,                No sneezing, itching, ear ache, nasal congestion, post nasal drip,   CV:  No chest pain,  Orthopnea, PND, swelling in lower extremities, anasarca, dizziness, palpitations, syncope.   GI  No heartburn, indigestion, abdominal pain, nausea, vomiting, diarrhea, change in bowel habits, loss of appetite, bloody stools.   Resp:    No chest wall deformity  Skin: no rash or lesions.  GU: no dysuria, change in color of urine, no urgency or frequency.  No flank pain, no hematuria   MS:  No joint pain or swelling.  No decreased range of motion.  No back pain.    Physical Exam  BP 134/84 (BP Location: Left Arm, Cuff Size: Normal)   Pulse 65   Ht 5\' 10"  (1.778 m)   Wt (!) 376 lb (170.6 kg)   SpO2 95%   BMI 53.95 kg/m   GEN: A/Ox3; pleasant ,  NAD, obese    HEENT:  [place]/AT,  EACs-clear, TMs-wnl, NOSE-clear, THROAT-clear, no lesions, no postnasal drip or exudate noted.   NECK:  Supple w/ fair ROM; no JVD; normal carotid impulses w/o bruits; no thyromegaly or nodules palpated; no lymphadenopathy.    RESP  Clear  P & A; w/o, wheezes/ rales/ or rhonchi. no accessory muscle use, no dullness to percussion  CARD:  RRR, no m/r/g, tr peripheral edema, pulses intact, no cyanosis or clubbing.  GI:   Soft & nt; nml bowel sounds; no organomegaly or masses detected.   Musco: Warm bil, no deformities or joint swelling noted.   Neuro: alert, no  focal deficits noted.    Skin: Warm, no lesions or rashes    Lab Results:  CBC    Component Value Date/Time   WBC 10.2 05/07/2017 1400   RBC 4.54 05/07/2017 1400   HGB 12.8 (L) 05/07/2017 1400   HCT 40.1 05/07/2017 1400   PLT 209 05/07/2017 1400   MCV 88.3 05/07/2017 1400   MCH 28.2 05/07/2017 1400   MCHC 31.9 05/07/2017 1400   RDW 14.3 05/07/2017 1400   LYMPHSABS 2,670 03/24/2017 1214   EOSABS 300 03/24/2017 1214   BASOSABS 80 03/24/2017 1214    BMET    Component Value Date/Time   NA 139 05/07/2017 1400   K 3.7 05/07/2017 1400   CL 107 05/07/2017 1400   CO2 25 05/07/2017 1400   GLUCOSE 110 (H) 05/07/2017 1400   BUN 13 05/07/2017 1400   CREATININE 0.70 05/07/2017 1400   CREATININE 0.69 (L) 03/24/2017 1214   CALCIUM 9.2 05/07/2017 1400   GFRNONAA >60 05/07/2017 1400   GFRNONAA 108 03/24/2017 1214   GFRAA >60 05/07/2017 1400   GFRAA 126 03/24/2017 1214    BNP    Component Value Date/Time   BNP 15.0 11/05/2016 1421    ProBNP No results found for: PROBNP  Imaging: No results found.   Assessment & Plan:   Chronic obstructive pulmonary disease (HCC) Possible COPD (no obstruction on PFT ) in smoker -  Unable to afford inhaler. Does have subjective benefit from albuterol . Would like to decrease this use  Trial of neb lama   Plan  Patient Instructions  Work on  not smoking .  Keep up the good work Try ipratropium nebulizer 3 times daily May use albuterol nebulizer as needed every 4-6 hours Continue to follow-up with hematology and remain on Coumadin until advised otherwise Follow-up for CT scan in April as planned Follow-up with Dr. 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 Changed from Xarelto to Coumadin  Now undergoing workup for chronic PE /reuccurrent PE by hematology .  May need VQ scan as PAP on echo in 09/2016 ? CTEPH .  Cont follow up with Hematology and workup .       Rubye Oaks, NP 07/09/2017

## 2017-07-09 NOTE — Assessment & Plan Note (Signed)
Cont on CPAP At bedtime  

## 2017-07-09 NOTE — Patient Instructions (Addendum)
Work on not smoking .  Keep up the good work Try ipratropium nebulizer 3 times daily May use albuterol nebulizer as needed every 4-6 hours Continue to follow-up with hematology and remain on Coumadin until advised otherwise Follow-up for CT scan in April as planned Follow-up with Dr. Vassie Loll in 2 months after CT scan and as needed Please contact office for sooner follow up if symptoms do not improve or worsen or seek emergency care

## 2017-07-09 NOTE — Assessment & Plan Note (Signed)
Follow up with CT chest in 09/2017

## 2017-07-09 NOTE — Assessment & Plan Note (Signed)
Wt loss  

## 2017-07-09 NOTE — Assessment & Plan Note (Signed)
Smoking cessation  

## 2017-07-09 NOTE — Assessment & Plan Note (Signed)
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 Changed from Xarelto to Coumadin  Now undergoing workup for chronic PE /reuccurrent PE by hematology .  May need VQ scan as PAP on echo in 09/2016 ? CTEPH .  Cont follow up with Hematology and workup .

## 2017-07-14 ENCOUNTER — Telehealth: Payer: Self-pay | Admitting: Pulmonary Disease

## 2017-07-14 MED ORDER — IPRATROPIUM BROMIDE 0.02 % IN SOLN: 0.5000 mg | Freq: Four times a day (QID) | RESPIRATORY_TRACT | 12 refills | Status: DC

## 2017-07-14 MED ORDER — PREDNISONE 10 MG PO TABS: ORAL_TABLET | ORAL | 0 refills | Status: DC

## 2017-07-14 MED ORDER — IPRATROPIUM BROMIDE 0.02 % IN SOLN: 0.5000 mg | Freq: Three times a day (TID) | RESPIRATORY_TRACT | 5 refills | Status: DC

## 2017-07-14 NOTE — Telephone Encounter (Signed)
Prednisone 10 mg tabs  Take 2 tabs daily with food x 5ds, then 1 tab daily with food x 5ds then STOP  

## 2017-07-14 NOTE — Telephone Encounter (Signed)
Called spoke with patient Advised RA okayed the prednisone and discussed directions Rx sent to verified pharmacy Per the 2.1.19 office visit with TP, pt to begin Ipratropium TID [facility], spoke with pharmacist Misty Stanley and gave verbal authorization for Rx change to TID for 30 day supply which is #254mL  Med list updated Nothing further needed at this time; will sign off

## 2017-07-14 NOTE — Telephone Encounter (Signed)
Called and spoke with patient, apologized for prescription not being sent to pharmacy, resent neb solution.  Patient states that his sx of chronic dry cough has come back since the completion of prednisone on 07/06/17.  RA please advise on prednisone prescription and if it is ok to send in another one.   Thanks.

## 2017-07-21 NOTE — Progress Notes (Signed)
Reviewed & agree with plan  

## 2017-07-23 ENCOUNTER — Telehealth: Payer: Self-pay | Admitting: Pulmonary Disease

## 2017-07-23 DIAGNOSIS — J449 Chronic obstructive pulmonary disease, unspecified: Secondary | ICD-10-CM

## 2017-07-23 MED ORDER — PREDNISONE 10 MG PO TABS: ORAL_TABLET | ORAL | 0 refills | Status: DC

## 2017-07-23 MED ORDER — ALBUTEROL SULFATE (2.5 MG/3ML) 0.083% IN NEBU: 2.5000 mg | INHALATION_SOLUTION | Freq: Four times a day (QID) | RESPIRATORY_TRACT | 1 refills | Status: DC | PRN

## 2017-07-23 NOTE — Telephone Encounter (Signed)
Ok to refill x one 

## 2017-07-23 NOTE — Telephone Encounter (Signed)
Spoke with patient's wife. She is requesting a refill on the prednisone taper that was called in for the patient on 07/14/17. She stated that he still has a slight dry cough and increased SOB, but it is not as bad as it was before this taper. Denies any body aches or fever. Advised her that since he is not on a maintenance dosage of prednisone, I would need to get permission first. She verbalized understanding.   Patient also needed a refill on his albuterol neb solution. This has been sent in.   He would like to use [facility] in [place].   MW, please advise since RA is not here today. Thanks!

## 2017-07-23 NOTE — Telephone Encounter (Signed)
Also requesting refill albuterol for nebulizer.

## 2017-07-23 NOTE — Telephone Encounter (Signed)
Called and spoke with pt's spouse, Kennyth Arnold. Kennyth Arnold is aware that Rx for prednisone has been sent to preferred pharmacy. Nothing further is needed.

## 2017-08-16 ENCOUNTER — Telehealth: Payer: Self-pay | Admitting: Adult Health

## 2017-08-16 DIAGNOSIS — R0602 Shortness of breath: Secondary | ICD-10-CM

## 2017-08-16 NOTE — Telephone Encounter (Signed)
TP please advise patients wife states that he is having another flare up and they are requesting he be placed back on the prednisone. Please advise, thanks.

## 2017-08-16 NOTE — Telephone Encounter (Signed)
Ok to call prednisone taper. 10 mg tabs  Take 2 tabs daily with food x 5ds, then 1 tab daily with food x 5ds then STOP Not sure why he is needing so much prednisone as there is no evidence of obstructive lung disease. Reassess at office visit next week  with TP. Order VQ scan as per last assessment.  Chilton Greathouse MD [facility] Pager [phone] 08/16/2017, 5:22 PM

## 2017-08-16 NOTE — Telephone Encounter (Signed)
TP is not in the office  Last office visit was 2.1.19 w/ TP for 1 month follow up - was doing well at appt Per pt's chart his spouse called on 2.6.19 for pred Rx - RA okayed for taper Then on 2.15.19 MW refilled pred taper  Dr Isaiah Serge, do you mind looking at this?  Thanks.

## 2017-08-17 MED ORDER — PREDNISONE 10 MG PO TABS: ORAL_TABLET | ORAL | 0 refills | Status: DC

## 2017-08-17 NOTE — Telephone Encounter (Signed)
Spoke with pt's wife, Kennyth Arnold. She is aware of Dr. Shirlee More response. Order has been placed for prednisone, VQ scan and CXR. Nothing further was needed.

## 2017-08-17 NOTE — Telephone Encounter (Signed)
Attempted to contact Timothy Thomas. No answer, no option to leave a message. Will try back.

## 2017-08-18 ENCOUNTER — Encounter (HOSPITAL_COMMUNITY)
Admission: RE | Admit: 2017-08-18 | Discharge: 2017-08-18 | Disposition: A | Discharge status: Home / Self Care | Payer: Self-pay | Source: Ambulatory Visit | Attending: Pulmonary Disease | Admitting: Pulmonary Disease

## 2017-08-18 ENCOUNTER — Ambulatory Visit (HOSPITAL_COMMUNITY)
Admission: RE | Admit: 2017-08-18 | Discharge: 2017-08-18 | Disposition: A | Discharge status: Home / Self Care | Payer: Self-pay | Source: Ambulatory Visit | Attending: Pulmonary Disease | Admitting: Pulmonary Disease

## 2017-08-18 ENCOUNTER — Encounter (HOSPITAL_COMMUNITY): Payer: Self-pay

## 2017-08-18 DIAGNOSIS — Z950 Presence of cardiac pacemaker: Secondary | ICD-10-CM | POA: Insufficient documentation

## 2017-08-18 DIAGNOSIS — R0602 Shortness of breath: Secondary | ICD-10-CM

## 2017-08-18 MED ORDER — TECHNETIUM TO 99M ALBUMIN AGGREGATED
4.0000 | Freq: Once | INTRAVENOUS | Status: AC | PRN
  Administered 2017-08-18: 4 via INTRAVENOUS

## 2017-08-18 MED ORDER — TECHNETIUM TC 99M DIETHYLENETRIAME-PENTAACETIC ACID
30.0000 | Freq: Once | INTRAVENOUS | Status: AC | PRN
  Administered 2017-08-18: 30 via RESPIRATORY_TRACT

## 2017-08-19 ENCOUNTER — Telehealth: Payer: Self-pay | Admitting: Pulmonary Disease

## 2017-08-19 NOTE — Telephone Encounter (Signed)
Patients wife called back in regards to results. Results given nothing further needed.

## 2017-08-25 ENCOUNTER — Encounter: Payer: Self-pay | Admitting: Adult Health

## 2017-08-25 ENCOUNTER — Ambulatory Visit (INDEPENDENT_AMBULATORY_CARE_PROVIDER_SITE_OTHER): Payer: Self-pay | Admitting: Adult Health

## 2017-08-25 ENCOUNTER — Other Ambulatory Visit (INDEPENDENT_AMBULATORY_CARE_PROVIDER_SITE_OTHER): Payer: Self-pay

## 2017-08-25 DIAGNOSIS — J449 Chronic obstructive pulmonary disease, unspecified: Secondary | ICD-10-CM

## 2017-08-25 DIAGNOSIS — G4733 Obstructive sleep apnea (adult) (pediatric): Secondary | ICD-10-CM

## 2017-08-25 DIAGNOSIS — I5189 Other ill-defined heart diseases: Secondary | ICD-10-CM

## 2017-08-25 DIAGNOSIS — Z9989 Dependence on other enabling machines and devices: Secondary | ICD-10-CM

## 2017-08-25 DIAGNOSIS — I519 Heart disease, unspecified: Secondary | ICD-10-CM

## 2017-08-25 LAB — BASIC METABOLIC PANEL
BUN: 10 mg/dL (ref 6–23)
CALCIUM: 9.7 mg/dL (ref 8.4–10.5)
CO2: 27 mEq/L (ref 19–32)
CREATININE: 0.75 mg/dL (ref 0.40–1.50)
Chloride: 104 mEq/L (ref 96–112)
GFR: 115.49 mL/min (ref >60.00)
Glucose, Bld: 121 mg/dL — ABNORMAL HIGH (ref 70–99)
Potassium: 4.4 mEq/L (ref 3.5–5.1)
Sodium: 144 mEq/L (ref 135–145)

## 2017-08-25 LAB — BRAIN NATRIURETIC PEPTIDE: PRO B NATRI PEPTIDE: 21 pg/mL (ref 0.0–100.0)

## 2017-08-25 MED ORDER — IPRATROPIUM BROMIDE 0.02 % IN SOLN: 0.5000 mg | Freq: Four times a day (QID) | RESPIRATORY_TRACT | 5 refills | Status: DC

## 2017-08-25 MED ORDER — ALBUTEROL SULFATE (2.5 MG/3ML) 0.083% IN NEBU
2.5000 mg | INHALATION_SOLUTION | Freq: Four times a day (QID) | RESPIRATORY_TRACT | 1 refills | Status: DC | PRN
Start: 2017-08-25 — End: 2017-09-16

## 2017-08-25 MED ORDER — ALBUTEROL SULFATE HFA 108 (90 BASE) MCG/ACT IN AERS: 2.0000 | INHALATION_SPRAY | Freq: Four times a day (QID) | RESPIRATORY_TRACT | 6 refills | Status: DC | PRN

## 2017-08-25 MED ORDER — FUROSEMIDE 20 MG PO TABS: ORAL_TABLET | ORAL | 1 refills | Status: DC

## 2017-08-25 NOTE — Progress Notes (Signed)
@Patient  ID: Timothy Thomas, male    DOB: 11-12-1965, 52 y.o.   MRN: [id number]  Chief Complaint  Patient presents with  . Acute Visit    COPD     Referring provider: Massie Maroon, FNP  HPI: 52 year old morbidly obese male active smoker followed for chronic cough, PE, lung nodule, and COPD Has OSA on CPAP .   TEST  Significant tests/ events reviewed  Spirometry12/2018 >>moderate restriction with ratio of 83, FEV1 of 70% and FVC of 65%.  CT Angioof 09/2016 shows right lower lobe 1.0 cm nodule that is stable on follow-up CT in 03/2017, left lower lobe nodule is calcified and noted to be present in the past  08/25/2017  Follow up : COPD  Patient presents for a follow office visit.  He complains of increased dyspnea and cough . Seen in ER for COPD flare earlier this month . , tx w/ steroid taper .  Says some better but still gets winded easily . Retaining fluid in ankles . Wt trending up .  Set up for VQ scan on 3/12 showed low probability for PE . CXR showed clear lungs.  Remains on atrovent nebs Three times a day  .  Has OSA on CPAP At bedtime  .feels rested.      Allergies  Allergen Reactions  . Codeine Nausea And Vomiting    Low tolerance to narcotics  . Ketorolac Nausea And Vomiting    Vomiting      Immunization History  Administered Date(s) Administered  . Influenza,inj,Quad PF,6+ Mos 02/09/2017  . Pneumococcal Polysaccharide-23 10/02/2016    Past Medical History:  Diagnosis Date  . Aphasia    Transient - negative head CT and EEG with neurology workup February 2017 [facility])  . COPD (chronic obstructive pulmonary disease) (HCC)   . Glaucoma   . Macular degeneration   . Myocardial infarction (HCC)   . Obesity   . Obstructive sleep apnea   . Tachycardia-bradycardia syndrome (HCC)    Status post pacemaker  . Variant angina (HCC)    History of normal coronary arteries at cardiac catheterization 2002 - vasospasm noted  . Vasovagal syncope      Tobacco History: Social History   Tobacco Use  Smoking Status Former Smoker  . Packs/day: 1.00  . Years: 40.00  . Pack years: 40.00  . Types: Cigarettes  . Start date: 09/26/1976  . Last attempt to quit: 06/25/2017  . Years since quitting: 0.1  Smokeless Tobacco Never Used  Tobacco Comment   currently smoking 0.5ppd as of 05/28/17  ep   Counseling given: Not Answered Comment: currently smoking 0.5ppd as of 05/28/17  ep   Outpatient Encounter Medications as of 08/25/2017  Medication Sig  . albuterol (PROVENTIL) (2.5 MG/3ML) 0.083% nebulizer solution Take 3 mLs (2.5 mg total) by nebulization every 6 (six) hours as needed for wheezing or shortness of breath.  Marland Kitchen atorvastatin (LIPITOR) 40 MG tablet Take 1 tablet (40 mg total) by mouth daily at 6 PM.  . diphenhydramine-acetaminophen (TYLENOL PM) 25-500 MG TABS tablet Take 3 tablets by mouth at bedtime.   Tery Sanfilippo Sodium (COLACE PO) Take by mouth.  Marland Kitchen ipratropium (ATROVENT) 0.02 % nebulizer solution Take 2.5 mLs (0.5 mg total) by nebulization 3 (three) times daily.  . pantoprazole (PROTONIX) 40 MG tablet Take 1 tablet (40 mg total) by mouth daily.  . potassium chloride (K-DUR) 10 MEQ tablet Take 1 tablet (10 mEq total) by mouth daily.  . predniSONE (DELTASONE) 10 MG  tablet Take 2 tabs daily with food x 5ds, then 1 tab daily with food x 5ds then STOP  . warfarin (COUMADIN) 5 MG tablet Take 5-10 mg by mouth daily.   Marland Kitchen albuterol (PROVENTIL HFA;VENTOLIN HFA) 108 (90 Base) MCG/ACT inhaler Inhale 2 puffs into the lungs every 6 (six) hours as needed for wheezing or shortness of breath. (Patient not taking: Reported on 08/25/2017)  . [DISCONTINUED] chlorpheniramine-HYDROcodone (TUSSIONEX PENNKINETIC ER) 10-8 MG/5ML SUER Take 5 mLs by mouth at bedtime as needed for cough. (Patient not taking: Reported on 07/09/2017)  . [DISCONTINUED] umeclidinium-vilanterol (ANORO ELLIPTA) 62.5-25 MCG/INH AEPB Inhale 1 puff into the lungs daily. (Patient not  taking: Reported on 07/09/2017)  . [DISCONTINUED] umeclidinium-vilanterol (ANORO ELLIPTA) 62.5-25 MCG/INH AEPB Inhale 1 puff into the lungs daily. (Patient not taking: Reported on 07/09/2017)   No facility-administered encounter medications on file as of 08/25/2017.      Review of Systems  Constitutional:   No  weight loss, night sweats,  Fevers, chills, fatigue, or  lassitude.  HEENT:   No headaches,  Difficulty swallowing,  Tooth/dental problems, or  Sore throat,                No sneezing, itching, ear ache, nasal congestion, post nasal drip,   CV:  No chest pain,  Orthopnea, PND, swelling in lower extremities, anasarca, dizziness, palpitations, syncope.   GI  No heartburn, indigestion, abdominal pain, nausea, vomiting, diarrhea, change in bowel habits, loss of appetite, bloody stools.   Resp: No shortness of breath with exertion or at rest.  No excess mucus, no productive cough,  No non-productive cough,  No coughing up of blood.  No change in color of mucus.  No wheezing.  No chest wall deformity  Skin: no rash or lesions.  GU: no dysuria, change in color of urine, no urgency or frequency.  No flank pain, no hematuria   MS:  No joint pain or swelling.  No decreased range of motion.  No back pain.    Physical Exam  BP 132/86 (BP Location: Right Arm, Cuff Size: Normal)   Pulse 72   Ht 5\' 10"  (1.778 m)   Wt (!) 384 lb 12.8 oz (174.5 kg)   SpO2 94%   BMI 55.21 kg/m   GEN: A/Ox3; pleasant , NAD, obese    HEENT:  [place]/AT,  EACs-clear, TMs-wnl, NOSE-clear, THROAT-clear, no lesions, no postnasal drip or exudate noted. Poor dentition . Class 3 MP airway   NECK:  Supple w/ fair ROM; no JVD; normal carotid impulses w/o bruits; no thyromegaly or nodules palpated; no lymphadenopathy.    RESP  Clear  P & A; w/o, wheezes/ rales/ or rhonchi. no accessory muscle use, no dullness to percussion  CARD:  RRR, no m/r/g, +  peripheral edema, pulses intact, no cyanosis or clubbing.  GI:   Soft  & nt; nml bowel sounds; no organomegaly or masses detected.   Musco: Warm bil, no deformities or joint swelling noted.   Neuro: alert, no focal deficits noted.    Skin: Warm, no lesions or rashes    Lab Results:  BMET  ProBNP No results found for: PROBNP  Imaging: Dg Chest 2 View  Result Date: 08/18/2017 CLINICAL DATA:  Shortness of Breath EXAM: CHEST - 2 VIEW COMPARISON:  August 11, 2017. FINDINGS: Pacemaker device is present on left with pacemaker lead tips attached to the right atrium and right ventricle. Lungs are clear. Heart size and pulmonary vascularity are normal. No adenopathy. No  pneumothorax. No bone lesions. IMPRESSION: Pacemaker lead tips attached to right atrium and right ventricle. No edema or consolidation. Heart size normal. No evident adenopathy. Electronically Signed   By: Bretta Bang III M.D.   On: 08/18/2017 14:53   Nm Pulmonary Per & Vent  Result Date: 08/18/2017 CLINICAL DATA:  Shortness of Breath. History of prior pulmonary emboli EXAM: NUCLEAR MEDICINE VENTILATION - PERFUSION LUNG SCAN VIEWS: Anterior, posterior, left lateral, right lateral, RPO, LPO, RAO, LAO-ventilation and perfusion RADIOPHARMACEUTICALS:  30.0 mCi of Tc-78m DTPA aerosol inhalation and 4.0 mCi Tc58m-MAA IV COMPARISON:  Multiple prior chest CT angiogram examinations. Chest radiograph August 18, 2017 FINDINGS: Ventilation: There is a pacemaker device on the left with photopenia at the site of the pacemaker device on several images, expected. Linear areas of decreased uptake in both upper lobes are noted in a symmetric manner. These linear areas of decreased uptake are in a nonsegmental distribution. There is no segmental ventilation defect evident. Perfusion: Pacemaker defect on the left, matching the ventilation study. Linear areas of decreased attenuation in both upper lobes in a nonsegmental distribution match the findings on the lesions study. There is no segmental perfusion defect. There is  no appreciable ventilation/perfusion mismatch. IMPRESSION: Matching linear areas of decreased radiotracer uptake in both upper lobes. No segmental perfusion defect or ventilation/perfusion mismatch. Pacemaker lead noted on the left. These findings constitute an overall low probability of pulmonary embolus. Electronically Signed   By: Bretta Bang III M.D.   On: 08/18/2017 14:51     Assessment & Plan:   No problem-specific Assessment & Plan notes found for this encounter.     Rubye Oaks, NP 08/25/2017

## 2017-08-25 NOTE — Patient Instructions (Addendum)
Begin Lasix 20mg  2 tabs daily for 3 days, then 20mg .. daily  Labs today .  Increase Ipratropium nebulizer Four times a day  .  May use albuterol nebulizer as needed every 4-6 hours Continue on CPAP At bedtime  .  Follow up with Dr. Vassie Loll  In 6 weeks and As needed   Please contact office for sooner follow up if symptoms do not improve or worsen or seek emergency care

## 2017-08-27 DIAGNOSIS — I5189 Other ill-defined heart diseases: Secondary | ICD-10-CM | POA: Insufficient documentation

## 2017-08-27 NOTE — Assessment & Plan Note (Signed)
Increased sx load with suspected fluid overload  Gentle diuresis as able  Increase LAMA .  Pt does not have any insurance so cost is an issue  Plan  . Patient Instructions  Begin Lasix 20mg  2 tabs daily for 3 days, then 20mg .. daily  Labs today .  Increase Ipratropium nebulizer Four times a day  .  May use albuterol nebulizer as needed every 4-6 hours Continue on CPAP At bedtime  .  Follow up with Dr. Vassie Loll  In 6 weeks and As needed   Please contact office for sooner follow up if symptoms do not improve or worsen or seek emergency care

## 2017-08-27 NOTE — Assessment & Plan Note (Signed)
Cont on CPAP At bedtime   Wt loss  CPAP download

## 2017-08-27 NOTE — Assessment & Plan Note (Signed)
Echo 2018 with Gr 1 DD .  Wt trending up with LE edema  Will try to gently diuresis with lasix  Check labs with bnp and bmet  Plan  Patient Instructions  Begin Lasix 20mg  2 tabs daily for 3 days, then 20mg .. daily  Labs today .  Increase Ipratropium nebulizer Four times a day  .  May use albuterol nebulizer as needed every 4-6 hours Continue on CPAP At bedtime  .  Follow up with Dr. Vassie Loll  In 6 weeks and As needed   Please contact office for sooner follow up if symptoms do not improve or worsen or seek emergency care

## 2017-09-16 ENCOUNTER — Telehealth: Payer: Self-pay | Admitting: Pulmonary Disease

## 2017-09-16 DIAGNOSIS — J449 Chronic obstructive pulmonary disease, unspecified: Secondary | ICD-10-CM

## 2017-09-16 MED ORDER — ALBUTEROL SULFATE (2.5 MG/3ML) 0.083% IN NEBU: 2.5000 mg | INHALATION_SOLUTION | Freq: Four times a day (QID) | RESPIRATORY_TRACT | 1 refills | Status: DC | PRN

## 2017-09-16 NOTE — Telephone Encounter (Signed)
Called and spoke with patient and wife. Patient was recently in the [place] in [place] and they are going to be sending [place] records, gave our fax number. Patient reported that while he was in the hospital the was using DuoNeb and Pulmicort and those medications helped him a lot. Patient is asking if these medications can be prescribed to him. Patient rescheduled next OV for 10/25/17 at 1030.  RA please advise.

## 2017-09-17 MED ORDER — PREDNISONE 10 MG PO TABS: ORAL_TABLET | ORAL | 0 refills | Status: DC

## 2017-09-17 MED ORDER — BUDESONIDE 0.5 MG/2ML IN SUSP: 0.5000 mg | Freq: Two times a day (BID) | RESPIRATORY_TRACT | 12 refills | Status: DC

## 2017-09-17 NOTE — Telephone Encounter (Signed)
Spoke with [employer]. She is aware of RA's recs. Prednisone has been sent in.   Nothing else needed at time of call.

## 2017-09-17 NOTE — Telephone Encounter (Signed)
Pt stated he will continue taking the albuterol and atrovent since they are the same as the duoneb and stated he was fine with [place] calling in pulmicort neb sol.  Pt also stated he would like a steroid Rx to be sent in.  Dr. Vassie Loll, please advise if you are wanting [place] to send pred taper or how you want the prednisone script to be?

## 2017-09-17 NOTE — Telephone Encounter (Signed)
Prednisone 10 mg tabs Take 4 tabs  daily with food x 4 days, then 3 tabs daily x 4 days, then 2 tabs daily x 4 days, then 1 tab daily x4 days then stop. #40  Needs OV in 1-2 weeks

## 2017-09-17 NOTE — Telephone Encounter (Signed)
Left message for patient to call back  

## 2017-09-17 NOTE — Telephone Encounter (Signed)
He has albuterol and Atrovent already, which is the same as duo nebs Okay to prescribe duo nebs and steroid if needed. Okay to prescribe Pulmicort 0.5 mg nebs twice daily

## 2017-09-17 NOTE — Telephone Encounter (Signed)
Pt is calling back [phone]

## 2017-10-01 ENCOUNTER — Telehealth: Payer: Self-pay | Admitting: Pulmonary Disease

## 2017-10-01 DIAGNOSIS — J449 Chronic obstructive pulmonary disease, unspecified: Secondary | ICD-10-CM

## 2017-10-01 MED ORDER — ALBUTEROL SULFATE (2.5 MG/3ML) 0.083% IN NEBU: 2.5000 mg | INHALATION_SOLUTION | Freq: Four times a day (QID) | RESPIRATORY_TRACT | 5 refills | Status: DC | PRN

## 2017-10-01 NOTE — Telephone Encounter (Signed)
Spoke with pt's wife and advised rx sent to pharmacy. Nothing further is needed.   

## 2017-10-18 ENCOUNTER — Ambulatory Visit: Payer: Self-pay | Admitting: Pulmonary Disease

## 2017-10-25 ENCOUNTER — Ambulatory Visit (INDEPENDENT_AMBULATORY_CARE_PROVIDER_SITE_OTHER): Payer: Self-pay | Admitting: Pulmonary Disease

## 2017-10-25 ENCOUNTER — Encounter: Payer: Self-pay | Admitting: Pulmonary Disease

## 2017-10-25 DIAGNOSIS — J449 Chronic obstructive pulmonary disease, unspecified: Secondary | ICD-10-CM

## 2017-10-25 DIAGNOSIS — R911 Solitary pulmonary nodule: Secondary | ICD-10-CM

## 2017-10-25 MED ORDER — BUDESONIDE 0.5 MG/2ML IN SUSP: 0.5000 mg | Freq: Two times a day (BID) | RESPIRATORY_TRACT | 12 refills | Status: DC

## 2017-10-25 MED ORDER — ALBUTEROL SULFATE (2.5 MG/3ML) 0.083% IN NEBU: 2.5000 mg | INHALATION_SOLUTION | Freq: Four times a day (QID) | RESPIRATORY_TRACT | 5 refills | Status: DC | PRN

## 2017-10-25 MED ORDER — ALBUTEROL SULFATE HFA 108 (90 BASE) MCG/ACT IN AERS: 2.0000 | INHALATION_SPRAY | Freq: Four times a day (QID) | RESPIRATORY_TRACT | 6 refills | Status: DC | PRN

## 2017-10-25 MED ORDER — PREDNISONE 10 MG PO TABS: ORAL_TABLET | ORAL | 0 refills | Status: DC

## 2017-10-25 NOTE — Assessment & Plan Note (Signed)
Refills on budesonide, albuterol and Atrovent nebs.  Okay to take Zyrtec 10 mg daily for 1 month and then again during fall Prednisone 10 mg tabs  Take 2 tabs daily with food x 5ds, then 1 tab daily with food x 5ds then STOP

## 2017-10-25 NOTE — Addendum Note (Signed)
Addended by: Pilar Grammes on: 10/25/2017 11:09 AM   Modules accepted: Orders

## 2017-10-25 NOTE — Assessment & Plan Note (Signed)
Obtain a copy of your sleep study and current settings from [facility]. Based on this we will write prescription for new machine.  Weight loss encouraged, compliance with goal of at least 4-6 hrs every night is the expectation. Advised against medications with sedative side effects Cautioned against driving when sleepy - understanding that sleepiness will vary on a day to day basis

## 2017-10-25 NOTE — Progress Notes (Signed)
   Subjective:    Patient ID: Timothy Thomas, male    DOB: September 08, 1965, 52 y.o.   MRN: [id number]  HPI  52 year old morbidly obese male active smoker followed for chronic cough, PE, lung nodule,and COPD Has OSA on CPAP .  He was hospitalized 09/2016 with a pulmonary embolism RV/LV ratio 4.2 and a non-STEMI, Venous duplex was negative. He was discharged on Xarelto but unfortunately developed another segmental PE in the left lower lobe in 03/2017 while on Xarelto.  He has a non functional pacemaker inserted in 1994  for a 10-second bradycardia   His last flareup of his breathing was in March-extended course of prednisone seems to have helped him.  He was hospitalized in [place] for left upper arm cellulitis, last INR was 1.9, Doppler of left upper extremity was negative, treated with doxycycline with much improvement.  He reports increased cough and dyspnea and wonders if this is due to seasonal allergies.  He does not take anything over-the-counter.  He needs refills on all his medications.  He continues to hyperlipidemia.  His last sleep study was back in 2012, he requests a new CPAP machine because his Respironics machine is giving him error codes  Significant tests/ events reviewed  Spirometry12/2018 >>moderate restriction with ratio of 83, FEV1 of 70% and FVC of 65%.  CT Angioof 09/2016 shows right lower lobe 1.0 cm nodule that is stable on follow-up CT in 03/2017, left lower lobe nodule is calcified and noted to be present in the past  CT chest 05/2017 stale 78mm RLL nodule   Past Medical History:  Diagnosis Date  . Aphasia    Transient - negative head CT and EEG with neurology workup February 2017 [facility])  . COPD (chronic obstructive pulmonary disease) (HCC)   . Glaucoma   . Macular degeneration   . Myocardial infarction (HCC)   . Obesity   . Obstructive sleep apnea   . Tachycardia-bradycardia syndrome (HCC)    Status post pacemaker  . Variant angina (HCC)    History of normal coronary arteries at cardiac catheterization 2002 - vasospasm noted  . Vasovagal syncope      Review of Systems neg for any significant sore throat, dysphagia, itching, sneezing, nasal congestion or excess/ purulent secretions, fever, chills, sweats, unintended wt loss, pleuritic or exertional cp, hempoptysis, orthopnea pnd or change in chronic leg swelling.   Also denies presyncope, palpitations, heartburn, abdominal pain, nausea, vomiting, diarrhea or change in bowel or urinary habits, dysuria,hematuria, rash, arthralgias, visual complaints, headache, numbness weakness or ataxia.     Objective:   Physical Exam  Gen. Pleasant, obese, in no distress ENT - no thrush, class 3 airway, no post nasal drip Neck: No JVD, no thyromegaly, no carotid bruits Lungs: no use of accessory muscles, no dullness to percussion, decreased without rales or rhonchi  Cardiovascular: Rhythm regular, heart sounds  normal, no murmurs or gallops, 2+ peripheral edema Musculoskeletal: No deformities, no cyanosis or clubbing , no tremors       Assessment & Plan:

## 2017-10-25 NOTE — Assessment & Plan Note (Signed)
Increase Lasix 40 twice a day for 1 week if possible

## 2017-10-25 NOTE — Patient Instructions (Signed)
Obtain a copy of your sleep study and current settings from [facility]. Based on this we will write prescription for new machine.  Refills on budesonide, albuterol and Atrovent nebs.  Okay to take Zyrtec 10 mg daily for 1 month and then again during fall Prednisone 10 mg tabs  Take 2 tabs daily with food x 5ds, then 1 tab daily with food x 5ds then STOP   Increase Lasix 40 twice a day for 1 week if possible

## 2017-10-25 NOTE — Assessment & Plan Note (Signed)
Will need 1 yr FU scan in dec 2019

## 2017-10-29 ENCOUNTER — Encounter: Payer: Self-pay | Admitting: Family Medicine

## 2017-10-29 ENCOUNTER — Ambulatory Visit (INDEPENDENT_AMBULATORY_CARE_PROVIDER_SITE_OTHER): Payer: Self-pay | Admitting: Family Medicine

## 2017-10-29 VITALS — BP 123/71 | HR 78 | Temp 97.8°F | Resp 20 | Ht 70.0 in | Wt 397.0 lb

## 2017-10-29 DIAGNOSIS — I5189 Other ill-defined heart diseases: Secondary | ICD-10-CM

## 2017-10-29 DIAGNOSIS — G4733 Obstructive sleep apnea (adult) (pediatric): Secondary | ICD-10-CM

## 2017-10-29 DIAGNOSIS — Z9989 Dependence on other enabling machines and devices: Secondary | ICD-10-CM

## 2017-10-29 DIAGNOSIS — E785 Hyperlipidemia, unspecified: Secondary | ICD-10-CM

## 2017-10-29 DIAGNOSIS — R053 Chronic cough: Secondary | ICD-10-CM

## 2017-10-29 DIAGNOSIS — K219 Gastro-esophageal reflux disease without esophagitis: Secondary | ICD-10-CM

## 2017-10-29 DIAGNOSIS — R05 Cough: Secondary | ICD-10-CM

## 2017-10-29 DIAGNOSIS — J449 Chronic obstructive pulmonary disease, unspecified: Secondary | ICD-10-CM

## 2017-10-29 MED ORDER — ATORVASTATIN CALCIUM 40 MG PO TABS: 40.0000 mg | ORAL_TABLET | Freq: Every day | ORAL | 1 refills | Status: DC

## 2017-10-29 MED ORDER — FUROSEMIDE 20 MG PO TABS: ORAL_TABLET | ORAL | 1 refills | Status: DC

## 2017-10-29 MED ORDER — PANTOPRAZOLE SODIUM 40 MG PO TBEC: 40.0000 mg | DELAYED_RELEASE_TABLET | Freq: Every day | ORAL | 1 refills | Status: DC

## 2017-10-29 MED ORDER — ALBUTEROL SULFATE (2.5 MG/3ML) 0.083% IN NEBU
2.5000 mg | INHALATION_SOLUTION | Freq: Once | RESPIRATORY_TRACT | Status: AC
  Administered 2017-10-29: 2.5 mg via RESPIRATORY_TRACT

## 2017-10-29 MED ORDER — IPRATROPIUM BROMIDE 0.02 % IN SOLN
0.5000 mg | Freq: Once | RESPIRATORY_TRACT | Status: AC
  Administered 2017-10-29: 0.5 mg via RESPIRATORY_TRACT

## 2017-10-29 MED ORDER — BENZONATATE 100 MG PO CAPS: 100.0000 mg | ORAL_CAPSULE | Freq: Three times a day (TID) | ORAL | 1 refills | Status: DC | PRN

## 2017-10-29 NOTE — Progress Notes (Signed)
Subjective:    Patient ID: Timothy Thomas, male    DOB: Jul 27, 1965, 52 y.o.   MRN: [id number]  HPI  Mr. Andrea Gaston, a very pleasant 52 year old male with a history of COPD, morbid obesity, and pulmonary embolous presents for follow up.    He is complaining of worsening dyspnea on exertion.  Patient was started on a long-acting beta agonist 1 month ago without relief.  A referral was also sent to pulmonology for further evaluation.  He is not scheduled appointment.  He states that he is using albuterol multiple times a day for relief.  He describes cough as constant and nonproductive.  He continues to be a chronic every day tobacco user and is morbidly obese.  He says that he gets short of breath with ambulation. It has been difficult to ambulate in home. He has a history of obstructive sleep apnea.  Symptoms include difficulty breathing and dyspnea on exertion. Symptoms began several days ago, symptoms have increased since that time. He also endorses edema to lower extremities that is worse at night.  Patient has not had recent travel. Patient has been using Symbicort twice daily without relief.  Appetite has been unaffected. Symptoms are exacerbated by climbing stairs, lying flat and minimal activity.   Mr.  Elenes had a CT of lung in June that showed a 1.0X 1.0 cm nodular area in the RLL. Also, there was lymph node prominence in the subcarinal and right hilar region and mild lymphoadenopathy. He was referred to [facility] in [place], [place].     Patient was recently started on warfarin for chronic pulmonary embolus.  His most recent INR was 3, which is therapeutic he endorses shortness of breath chest pain, shortness of breath, signs of bleeding, lower extremity edema. He is a chronic everyday smoker. He smokes a little less than 1 pack per day. He is not ready to quit at this time.    Past Medical History:  Diagnosis Date  . Aphasia    Transient - negative head CT and EEG with  neurology workup February 2017 [facility])  . COPD (chronic obstructive pulmonary disease) (HCC)   . Glaucoma   . Macular degeneration   . Myocardial infarction (HCC)   . Obesity   . Obstructive sleep apnea   . Tachycardia-bradycardia syndrome (HCC)    Status post pacemaker  . Variant angina (HCC)    History of normal coronary arteries at cardiac catheterization 2002 - vasospasm noted  . Vasovagal syncope    Social History   Socioeconomic History  . Marital status: Married    Spouse name: Not on file  . Number of children: Not on file  . Years of education: Not on file  . Highest education level: Not on file  Occupational History  . Not on file  Social Needs  . Financial resource strain: Not on file  . Food insecurity:    Worry: Not on file    Inability: Not on file  . Transportation needs:    Medical: Not on file    Non-medical: Not on file  Tobacco Use  . Smoking status: Former Smoker    Packs/day: 1.00    Years: 40.00    Pack years: 40.00    Types: Cigarettes    Start date: 09/26/1976    Last attempt to quit: 06/25/2017    Years since quitting: 0.3  . Smokeless tobacco: Never Used  . Tobacco comment: currently smoking 0.5ppd as of 05/28/17  ep  Substance and Sexual Activity  . Alcohol use: No  . Drug use: No  . Sexual activity: Not on file  Lifestyle  . Physical activity:    Days per week: Not on file    Minutes per session: Not on file  . Stress: Not on file  Relationships  . Social connections:    Talks on phone: Not on file    Gets together: Not on file    Attends religious service: Not on file    Active member of club or organization: Not on file    Attends meetings of clubs or organizations: Not on file    Relationship status: Not on file  . Intimate partner violence:    Fear of current or ex partner: Not on file    Emotionally abused: Not on file    Physically abused: Not on file    Forced sexual activity: Not on file  Other Topics Concern  . Not  on file  Social History Narrative  . Not on file   Immunization History  Administered Date(s) Administered  . Influenza,inj,Quad PF,6+ Mos 02/09/2017  . Pneumococcal Polysaccharide-23 10/02/2016   Review of Systems  Constitutional: Positive for unexpected weight change (morbid obesity).  Eyes: Negative.   Respiratory: Positive for apnea, cough and chest tightness.   Cardiovascular: Positive for leg swelling.  Gastrointestinal: Negative.   Endocrine: Negative.  Negative for cold intolerance, heat intolerance, polydipsia, polyphagia and polyuria.  Genitourinary: Negative.   Allergic/Immunologic: Negative.   Neurological: Negative.  Negative for weakness and numbness.  Hematological: Negative.        Objective:   Physical Exam  Constitutional: He is oriented to person, place, and time.  Morbid obesit   HENT:  Head: Normocephalic and atraumatic.  Right Ear: External ear normal.  Left Ear: External ear normal.  Nose: Nose normal.  Mouth/Throat: Oropharynx is clear and moist.  Eyes: Pupils are equal, round, and reactive to light. Conjunctivae and EOM are normal.  Neck: Normal range of motion. Neck supple.  Cardiovascular: Normal rate, regular rhythm, normal heart sounds and intact distal pulses.  No murmur heard. Pulmonary/Chest: Effort normal. He has decreased breath sounds. He has no wheezes.  Abdominal: Soft. Bowel sounds are normal.  Increased abdominal girth  Musculoskeletal: Normal range of motion.  Neurological: He is alert and oriented to person, place, and time. He has normal reflexes.  Skin: Skin is warm and dry.  Psychiatric: He has a normal mood and affect. His behavior is normal. Judgment and thought content normal.        BP 123/71 (BP Location: Right Arm, Patient Position: Sitting, Cuff Size: Large)   Pulse 78   Temp 97.8 F (36.6 C) (Oral)   Resp 20   Ht 5\' 10"  (1.778 m)   Wt (!) 397 lb (180.1 kg)   SpO2 92%   BMI 56.96 kg/m  Assessment & Plan:  1.  OSA on CPAP - Split night study; Future - albuterol (PROVENTIL) (2.5 MG/3ML) 0.083% nebulizer solution 2.5 mg - ipratropium (ATROVENT) nebulizer solution 0.5 mg  2. Morbid obesity (HCC) Body mass index is 56.96 kg/m. Filed Weights   10/29/17 1142  Weight: (!) 397 lb (180.1 kg)    3. Chronic obstructive pulmonary disease, unspecified COPD type (HCC) Continue to follow-up in pulmonology as scheduled for COPD.  Lung sounds improved after nebulizer treatment.  4. Hyperlipidemia LDL goal <100 The 10-year ASCVD risk score Denman George DC Jr., et al., 2013) is: 16.6%   Values  used to calculate the score:     Age: 51 years     Sex: Male     Is Non-Hispanic African American: No     Diabetic: No     Tobacco smoker: Yes     Systolic Blood Pressure: 123 mmHg     Is BP treated: No     HDL Cholesterol: 21 mg/dL     Total Cholesterol: 179 mg/dL  - atorvastatin (LIPITOR) 40 MG tablet; Take 1 tablet (40 mg total) by mouth daily at 6 PM.  Dispense: 90 tablet; Refill: 1  5. Gastroesophageal reflux disease without esophagitis - pantoprazole (PROTONIX) 40 MG tablet; Take 1 tablet (40 mg total) by mouth daily.  Dispense: 90 tablet; Refill: 1  6. Diastolic dysfunction - furosemide (LASIX) 20 MG tablet; 1-2 tabs daily for leg swelling  Dispense: 90 tablet; Refill: 1  7. Cough, persistent Tessalon Perles 100 mg 3 times per day as needed.   RTC: 3 months for chronic conditions   [name]  MSN, FNP-C Patient Care [facility] [address] [place], [place] [address] [phone]

## 2017-10-29 NOTE — Patient Instructions (Signed)

## 2017-11-08 ENCOUNTER — Ambulatory Visit: Payer: Self-pay | Attending: Family Medicine | Admitting: Pulmonary Disease

## 2017-11-08 DIAGNOSIS — Z9989 Dependence on other enabling machines and devices: Secondary | ICD-10-CM

## 2017-11-08 DIAGNOSIS — G4733 Obstructive sleep apnea (adult) (pediatric): Secondary | ICD-10-CM | POA: Insufficient documentation

## 2017-11-11 ENCOUNTER — Telehealth: Payer: Self-pay | Admitting: Pulmonary Disease

## 2017-11-11 DIAGNOSIS — G4733 Obstructive sleep apnea (adult) (pediatric): Secondary | ICD-10-CM

## 2017-11-11 NOTE — Telephone Encounter (Signed)
Reviewed prior sleep studies. Baseline PSG in 2000 showed severe OSA with AHI 23/hour, initially corrected by bilevel 15/11. On a final titration study 04/2008, weight 359 pounds, bilevel 19/15 required His current weight is 398 pounds. October BiPAP can be prescribed, EPAP minimum 11 cm, pressure support of 4 cm, IPAP maximum 20 cm, download and follow-up in 6 weeks

## 2017-11-16 NOTE — Telephone Encounter (Signed)
Spoke with patient. He is aware of results. However, he stated that he had a sleep study done last week on 6/6. He wants to know if you have reviewed those results as well.   RA, please advise. Thanks!

## 2017-11-17 NOTE — Telephone Encounter (Signed)
Have asked sleep lab to put in my folder to review

## 2017-11-19 DIAGNOSIS — G4730 Sleep apnea, unspecified: Secondary | ICD-10-CM

## 2017-11-19 NOTE — Telephone Encounter (Signed)
Reviewed split study - severe OSA 99/h Corrected by CPAP 13 cm  Pl send Rx for Auto CPAP 10-15 cm with airfit N10 wide nasal mask.  OV in 6 wks

## 2017-11-19 NOTE — Procedures (Signed)
Patient Name: Timothy Thomas, Timothy Thomas Date: 11/08/2017 Gender: Male D.O.B: 01-27-1966 Age (years): 51 Referring Provider: Cammie Sickle Height (inches): 70 Interpreting Physician: Kara Mead MD, ABSM Weight (lbs): 397 RPSGT: Rosebud Poles BMI: 57 MRN: [id number] Neck Size: 22.00 <br> <br> CLINICAL INFORMATION The patient is referred for a split study for sleep apnea.   SLEEP STUDY TECHNIQUE As per the AASM Manual for the Scoring of Sleep and Associated Events v2.3 (April 2016) with a hypopnea requiring 4% desaturations.  The channels recorded and monitored were frontal, central and occipital EEG, electrooculogram (EOG), submentalis EMG (chin), nasal and oral airflow, thoracic and abdominal wall motion, anterior tibialis EMG, snore microphone, electrocardiogram, and pulse oximetry. Continuous positive airway pressure (CPAP) was initiated at the beginning of the study and titrated to treat sleep-disordered breathing.  MEDICATIONS Medications self-administered by patient taken the night of the study : N/A  TECHNICIAN COMMENTS Comments added by technician: Patient met Split night protocol and tolerated CPAP well, although patient had difficulties tolerating Full Face mask and asked for nasal mask. Patient used three pillows to prop-up during CPAP therapy . Suboptimal pressure obtained due to REM-supine stage was not observed. CPAP therapy started at 5 cm of H20 and increased to 13 cm of H20 due to patients' request for more air and snoring episodes. Bradycardia noticed at times  RESPIRATORY PARAMETERS  Optimal PAP Pressure (cm):  AHI at Optimal Pressure (/hr): N/A Overall Minimal O2 (%): 83.0 Supine % at Optimal Pressure (%): 0 Minimal O2 at Optimal Pressure (%): 83.0   SLEEP ARCHITECTURE The study was initiated at 10:59:49 PM and ended at 4:50:15 AM.  Sleep onset time was 1.3 minutes and the sleep efficiency was 80.9%%. The total sleep time was 283.7 minutes.  The patient spent  7.9%% of the night in stage N1 sleep, 87.0%% in stage N2 sleep, 3.5%% in stage N3 and 1.59% in REM.Stage REM latency was 271.5 minutes  Wake after sleep onset was 65.5. Alpha intrusion was absent. Supine sleep was 0.00%.  CARDIAC DATA The 2 lead EKG demonstrated sinus rhythm. The mean heart rate was N/A beats per minute. Other EKG findings include: None. LEG MOVEMENT DATA The total Periodic Limb Movements of Sleep (PLMS) were 0. The PLMS index was 0.0. A PLMS index of <15 is considered normal in adults.  IMPRESSIONS - Baseline study showed severe OSA AHI 99/h - An optimal PAP pressure could not be selected since no supine sleep noted during titration - Central sleep apnea was not noted during this titration (CAI = 4.7/h). - Moderate oxygen desaturations were observed during this titration (min O2 = 83.0%). - The patient snored with soft snoring volume during this titration study. - No cardiac abnormalities were observed during this study. - Clinically significant periodic limb movements were not noted during this study. Arousals associated with PLMs were rare.   DIAGNOSIS - Obstructive Sleep Apnea (327.23 [G47.33 ICD-10])   RECOMMENDATIONS - Recommend CPAP 13 cm with airfit N10 wide nasal mask. May need higher pressure during supine sleep - Avoid alcohol, sedatives and other CNS depressants that may worsen sleep apnea and disrupt normal sleep architecture. - Sleep hygiene should be reviewed to assess factors that may improve sleep quality. - Weight management and regular exercise should be initiated or continued. - Return to [facility] for re-evaluation after 4 weeks of therapy    Kara Mead MD Board Certified in [place]

## 2017-11-19 NOTE — Telephone Encounter (Signed)
Pt is aware of below message and voiced his understanding. Order has been placed for cpap therapy. Pt has been scheduled for OV with RA on 12/29/17 at 1:45p. Nothing further is needed.

## 2017-11-24 ENCOUNTER — Telehealth: Payer: Self-pay | Admitting: Pulmonary Disease

## 2017-11-24 DIAGNOSIS — J449 Chronic obstructive pulmonary disease, unspecified: Secondary | ICD-10-CM

## 2017-11-24 MED ORDER — ALBUTEROL SULFATE (2.5 MG/3ML) 0.083% IN NEBU
2.5000 mg | INHALATION_SOLUTION | Freq: Four times a day (QID) | RESPIRATORY_TRACT | 5 refills | Status: DC | PRN
Start: 2017-11-24 — End: 2018-02-22

## 2017-11-24 MED ORDER — PREDNISONE 10 MG PO TABS: ORAL_TABLET | ORAL | 0 refills | Status: DC

## 2017-11-24 MED ORDER — IPRATROPIUM BROMIDE 0.02 % IN SOLN: 0.5000 mg | Freq: Four times a day (QID) | RESPIRATORY_TRACT | 5 refills | Status: DC

## 2017-11-24 MED ORDER — ALBUTEROL SULFATE HFA 108 (90 BASE) MCG/ACT IN AERS: 2.0000 | INHALATION_SPRAY | Freq: Four times a day (QID) | RESPIRATORY_TRACT | 5 refills | Status: DC | PRN

## 2017-11-24 NOTE — Telephone Encounter (Signed)
Pt stopped using prednisone X1.5 weeks ago, since then has experienced increased nonprod cough, chest "fullness", sob.  Denies sinus congestion, sore throat, chest pain, mucus production.  Pt states that this happens "every time I come off of prednisone".   Pt has been taking nyquil, dayquil, robitussin, neb tx, and maintenance meds to help with s/s.  Requesting additional recs.    Pharmacy: [facility] in [place]  RA please advise on recs. Thanks!

## 2017-11-24 NOTE — Telephone Encounter (Signed)
Pt aware of recs.  rx's sent to preferred pharmacy.  Nothing further needed.  

## 2017-11-24 NOTE — Telephone Encounter (Signed)
Prednisone 20 mg daily for 3 days, then 10 mg daily and stay at this dose until next appointment

## 2017-11-24 NOTE — Telephone Encounter (Signed)
Unable to speak to pt's spouse, as pt does not have DPR on file. lmtcb x1 for pt on mobile number on file.

## 2017-11-24 NOTE — Telephone Encounter (Signed)
Pt is calling back [phone]

## 2017-12-08 ENCOUNTER — Telehealth: Payer: Self-pay | Admitting: Pulmonary Disease

## 2017-12-10 NOTE — Telephone Encounter (Signed)
Called and spoke to the patient let him know that the paperwork was not mailed out till Monday 07/01 give it till tuesday

## 2017-12-17 ENCOUNTER — Telehealth: Payer: Self-pay | Admitting: Pulmonary Disease

## 2017-12-17 NOTE — Telephone Encounter (Signed)
Called patient unable to reach left message to give us a call back.

## 2017-12-20 NOTE — Telephone Encounter (Signed)
Called and spoke with Timothy Thomas who stated he still has not received the Timothy Thomas assistance forms for cpap. Looked at Timothy Thomas's chart and saw on 7/5 Johny Drilling called and spoke with Timothy Thomas stating to him the forms were mailed 7/1.  Per Timothy Thomas, he still has not received forms in the mail.  I verified Timothy Thomas's address we have on file to make sure we did have the correct address listed, and Timothy Thomas confirmed the address on file is the right one.  PCCs, please advise if we can go ahead and send another copy of Timothy Thomas assistance forms for cpap to Timothy Thomas's address listed on file. Thanks!

## 2017-12-20 NOTE — Telephone Encounter (Signed)
Spoke to pt & advised I would send him CPAP Assistance forms again in the mail.  Nothing further needed.

## 2017-12-22 ENCOUNTER — Ambulatory Visit: Payer: Self-pay | Admitting: Pulmonary Disease

## 2017-12-29 ENCOUNTER — Ambulatory Visit: Payer: Self-pay | Admitting: Pulmonary Disease

## 2018-01-11 ENCOUNTER — Telehealth: Payer: Self-pay | Admitting: Pulmonary Disease

## 2018-01-11 NOTE — Telephone Encounter (Signed)
Called patient unable to reach left message to give us a call back.

## 2018-01-12 MED ORDER — PREDNISONE 10 MG PO TABS: ORAL_TABLET | ORAL | 1 refills | Status: DC

## 2018-01-12 NOTE — Telephone Encounter (Signed)
Called and spoke with patient regarding rx prednisone refill  Pt is to take 1 tab 10mg  daily until next ov with RA Pt is waiting on pt assistant forms to be approved for cpap machine Pt cannot schedule appt for cpap compliance until he has machine for 31 days Refilling prednisone with 1 refill until appt is scheduled Pt states paperwork was mailed back to [place] in the mail today  Routing message to Cherina to f/u with patient regarding pt assistant forms for cpap machine.

## 2018-01-14 NOTE — Telephone Encounter (Signed)
Routing message to Chernia to see if she has rec'd cpap assistance forms from pt in mail at this time.

## 2018-01-14 NOTE — Telephone Encounter (Signed)
I have not received any forms for the patient. Will update this encounter when I have.

## 2018-01-18 NOTE — Telephone Encounter (Signed)
Routing message to Timothy Thomas to see if paperwork has been rec'd.

## 2018-01-21 NOTE — Telephone Encounter (Signed)
Routing message to Cherina to see if she has rec'd paperwork at this time. Seen no paperwork at this time in RA cubby. Will await response as she is out of the office today.

## 2018-01-25 NOTE — Telephone Encounter (Signed)
Paperwork has not been received. Will update this encounter once it has been received.

## 2018-01-25 NOTE — Telephone Encounter (Signed)
Attempted to call patient today regarding when pt mailed pt asst forms for cpap into office. I did not receive an answer at time of call. I have left a voicemail message for pt to return call. X1  Checking with Cherina to see if paperwork has been rec'd.

## 2018-01-26 ENCOUNTER — Ambulatory Visit: Payer: Self-pay | Admitting: Adult Health

## 2018-01-26 NOTE — Telephone Encounter (Signed)
Spoke with pt, advised him that Cordelia Pen had not received the forms. He stated he would call back on Friday to see if they arrive at the office. If not, he states he would drive to [place] to take care of this. I offered him the option to fax the papers in but he does not have access to a fax machine. Nothing further needed at this time.

## 2018-01-26 NOTE — Telephone Encounter (Signed)
Checked all boxes and folders, we have not received paper work yet. Called patient unable to reach left message to give [place] a call back. Will await for paper work.

## 2018-01-26 NOTE — Telephone Encounter (Signed)
I have not received the forms and neither has Timothy Thomas.  The first time she mailed him the cpap assistance forms she mailed him a self addressed stamped envelope to be sent back to her attention.  I just remailed him the forms but neither of [place] have seen them.

## 2018-01-26 NOTE — Telephone Encounter (Signed)
Spoke with pt, he states he ,ailed the CPAP Assistance forms over a week ago. I saw that Cordelia Pen spoke with pt and mailed the forms to him again. Cordelia Pen do you have these forms? I have looked everywhere, I don't think we received them. Please advise.

## 2018-01-26 NOTE — Telephone Encounter (Signed)
Pt is returning call. Per Pt, he mailed Cpap paperwork two weeks ago. Requesting to speak to a nurse. Cb is [phone].

## 2018-02-02 ENCOUNTER — Telehealth: Payer: Self-pay | Admitting: Pulmonary Disease

## 2018-02-02 NOTE — Telephone Encounter (Signed)
Spoke with Sherri and she states they do not have forms. I advised pt to drive here to fill out the forms to prevent any more delay in this process. He agreed and will drive here to speak to Johny Drilling or Cordelia Pen about CPAP Assistance and forms. Nothing further is needed.

## 2018-02-17 ENCOUNTER — Telehealth: Payer: Self-pay | Admitting: Pulmonary Disease

## 2018-02-17 NOTE — Telephone Encounter (Signed)
The PCCs have not tried to call the patient.  His CPAP assistance form was found & has been faxed to the CPAP assistance program.

## 2018-02-17 NOTE — Telephone Encounter (Signed)
Spoke with patient. Advised him that the PCCs have received his paperwork. He is aware that we are awaiting a decision from the cpap assistance program.   Nothing further needed at time of call.

## 2018-02-22 ENCOUNTER — Telehealth: Payer: Self-pay | Admitting: Pulmonary Disease

## 2018-02-22 DIAGNOSIS — J449 Chronic obstructive pulmonary disease, unspecified: Secondary | ICD-10-CM

## 2018-02-22 MED ORDER — ALBUTEROL SULFATE (2.5 MG/3ML) 0.083% IN NEBU: 2.5000 mg | INHALATION_SOLUTION | Freq: Four times a day (QID) | RESPIRATORY_TRACT | 5 refills | Status: DC | PRN

## 2018-02-22 MED ORDER — ALBUTEROL SULFATE HFA 108 (90 BASE) MCG/ACT IN AERS: 2.0000 | INHALATION_SPRAY | Freq: Four times a day (QID) | RESPIRATORY_TRACT | 5 refills | Status: DC | PRN

## 2018-02-22 MED ORDER — PREDNISONE 10 MG PO TABS: ORAL_TABLET | ORAL | 1 refills | Status: DC

## 2018-02-22 MED ORDER — IPRATROPIUM BROMIDE 0.02 % IN SOLN: 0.5000 mg | Freq: Four times a day (QID) | RESPIRATORY_TRACT | 5 refills | Status: DC

## 2018-02-22 NOTE — Telephone Encounter (Signed)
Pt called needing refills for proventil neb meds, atrovent neb meds and prednisone.  Placed orders for meds today to pharmacy of choice. Nothing further needed.

## 2018-03-18 ENCOUNTER — Telehealth: Payer: Self-pay | Admitting: Pulmonary Disease

## 2018-03-21 NOTE — Telephone Encounter (Signed)
Chan faxed paperwork to [employer] Assistance [employer] on 8/29.  Called pt to make sure he has their phone # [phone] and that he has paid the 100.00.  Had to leave vm for pt to call me back.

## 2018-03-21 NOTE — Telephone Encounter (Signed)
Pt called me back.  He has not pd the 100.00 fee.  I gave him payment instructions and he states he will send it in tomorrow.  I gave him their phone # as well.  Nothing further needed at this time.

## 2018-04-07 DIAGNOSIS — Z7901 Long term (current) use of anticoagulants: Secondary | ICD-10-CM | POA: Insufficient documentation

## 2018-05-02 ENCOUNTER — Ambulatory Visit: Payer: Self-pay | Admitting: Family Medicine

## 2018-05-13 ENCOUNTER — Ambulatory Visit: Payer: Self-pay | Admitting: Family Medicine

## 2018-06-03 ENCOUNTER — Ambulatory Visit: Payer: Self-pay | Admitting: Family Medicine

## 2018-06-20 ENCOUNTER — Telehealth: Payer: Self-pay | Admitting: Pulmonary Disease

## 2018-06-20 DIAGNOSIS — J449 Chronic obstructive pulmonary disease, unspecified: Secondary | ICD-10-CM

## 2018-06-20 MED ORDER — ALBUTEROL SULFATE (2.5 MG/3ML) 0.083% IN NEBU: 2.5000 mg | INHALATION_SOLUTION | Freq: Four times a day (QID) | RESPIRATORY_TRACT | 5 refills | Status: DC | PRN

## 2018-06-20 MED ORDER — PREDNISONE 10 MG PO TABS: ORAL_TABLET | ORAL | 0 refills | Status: DC

## 2018-06-20 MED ORDER — IPRATROPIUM BROMIDE 0.02 % IN SOLN: 0.5000 mg | Freq: Four times a day (QID) | RESPIRATORY_TRACT | 5 refills | Status: DC

## 2018-06-20 NOTE — Telephone Encounter (Signed)
Called and spoke with Patient's Wife, Misty Stanley.  She requested refills for Albuterol nebs, Ipratropium nebs, and prednisone, to be sent to [facility], [place].  Patient is scheduled for OV with Tammy P, NP, 07/29/2018. Refills sent.  Prednisone 10mg , with no refills, 11/24/17 note, states 10mg  until next OV. Nothing further at  this time.

## 2018-06-23 ENCOUNTER — Ambulatory Visit (INDEPENDENT_AMBULATORY_CARE_PROVIDER_SITE_OTHER): Payer: Self-pay | Admitting: Family Medicine

## 2018-06-23 ENCOUNTER — Encounter: Payer: Self-pay | Admitting: Family Medicine

## 2018-06-23 VITALS — BP 122/56 | HR 64 | Temp 97.7°F | Resp 18 | Ht 70.0 in | Wt 388.0 lb

## 2018-06-23 DIAGNOSIS — Z720 Tobacco use: Secondary | ICD-10-CM

## 2018-06-23 DIAGNOSIS — R4701 Aphasia: Secondary | ICD-10-CM

## 2018-06-23 DIAGNOSIS — G4733 Obstructive sleep apnea (adult) (pediatric): Secondary | ICD-10-CM

## 2018-06-23 DIAGNOSIS — Z9989 Dependence on other enabling machines and devices: Secondary | ICD-10-CM

## 2018-06-23 DIAGNOSIS — Z7901 Long term (current) use of anticoagulants: Secondary | ICD-10-CM

## 2018-06-23 DIAGNOSIS — E785 Hyperlipidemia, unspecified: Secondary | ICD-10-CM

## 2018-06-23 DIAGNOSIS — J449 Chronic obstructive pulmonary disease, unspecified: Secondary | ICD-10-CM

## 2018-06-23 NOTE — Patient Instructions (Signed)
COPD and Physical Activity  Chronic obstructive pulmonary disease (COPD) is a long-term (chronic) condition that affects the lungs. COPD is a general term that can be used to describe many different lung problems that cause lung swelling (inflammation) and limit airflow, including chronic bronchitis and emphysema.  The main symptom of COPD is shortness of breath, which makes it harder to do even simple tasks. This can also make it harder to exercise and be active. Talk with your health care provider about treatments to help you breathe better and actions you can take to prevent breathing problems during physical activity.  What are the benefits of exercising with COPD?  Exercising regularly is an important part of a healthy lifestyle. You can still exercise and do physical activities even though you have COPD. Exercise and physical activity improve your shortness of breath by increasing blood flow (circulation). This causes your heart to pump more oxygen through your body. Moderate exercise can improve your:  · Oxygen use.  · Energy level.  · Shortness of breath.  · Strength in your breathing muscles.  · Heart health.  · Sleep.  · Self-esteem and feelings of self-worth.  · Depression, stress, and anxiety levels.  Exercise can benefit everyone with COPD. The severity of your disease may affect how hard you can exercise, especially at first, but everyone can benefit. Talk with your health care provider about how much exercise is safe for you, and which activities and exercises are safe for you.  What actions can I take to prevent breathing problems during physical activity?  · Sign up for a pulmonary rehabilitation program. This type of program may include:  ? Education about lung diseases.  ? Exercise classes that teach you how to exercise and be more active while improving your breathing. This usually involves:  § Exercise using your lower extremities, such as a stationary bicycle.  § About 30 minutes of exercise, 2  to 5 times per week, for 6 to 12 weeks  § Strength training, such as push ups or leg lifts.  ? Nutrition education.  ? Group classes in which you can talk with others who also have COPD and learn ways to manage stress.  · If you use an oxygen tank, you should use it while you exercise. Work with your health care provider to adjust your oxygen for your physical activity. Your resting flow rate is different from your flow rate during physical activity.  · While you are exercising:  ? Take slow breaths.  ? Pace yourself and do not try to go too fast.  ? Purse your lips while breathing out. Pursing your lips is similar to a kissing or whistling position.  ? If doing exercise that uses a quick burst of effort, such as weight lifting:  § Breathe in before starting the exercise.  § Breathe out during the hardest part of the exercise (such as raising the weights).  Where to find support  You can find support for exercising with COPD from:  · Your health care provider.  · A pulmonary rehabilitation program.  · Your local health department or community health programs.  · Support groups, online or in-person. Your health care provider may be able to recommend support groups.  Where to find more information  You can find more information about exercising with COPD from:  · American Lung Association: lung.org.  · COPD Foundation: copdfoundation.org.  Contact a health care provider if:  · Your symptoms get worse.  · You   have chest pain.  · You have nausea.  · You have a fever.  · You have trouble talking or catching your breath.  · You want to start a new exercise program or a new activity.  Summary  · COPD is a general term that can be used to describe many different lung problems that cause lung swelling (inflammation) and limit airflow. This includes chronic bronchitis and emphysema.  · Exercise and physical activity improve your shortness of breath by increasing blood flow (circulation). This causes your heart to provide more  oxygen to your body.  · Contact your health care provider before starting any exercise program or new activity. Ask your health care provider what exercises and activities are safe for you.  This information is not intended to replace advice given to you by your health care provider. Make sure you discuss any questions you have with your health care provider.  Document Released: 06/17/2017 Document Revised: 06/17/2017 Document Reviewed: 06/17/2017  Elsevier Interactive Patient Education © 2019 Elsevier Inc.

## 2018-06-23 NOTE — Progress Notes (Signed)
[facility]   Progress Note: General Provider: Mike Gip, FNP  SUBJECTIVE:   Timothy Thomas is a 53 y.o. male who  has a past medical history of Aphasia, COPD (chronic obstructive pulmonary disease) (HCC), Glaucoma, Macular degeneration, Myocardial infarction (HCC), Obesity, Obstructive sleep apnea, Tachycardia-bradycardia syndrome (HCC), Variant angina (HCC), and Vasovagal syncope.. Patient presents today for Follow-up (was in the hospital in october ) and Cough (pain under left rib when coughing )   Patient states that he has had pain under the left rib since he coughed in October. Patient states that it "felt like the sternum split in 2". Patient states that he was told that he "split the muscle" and he continues to have pain and numbness. He is followed by hematology for PE's. Also followed by pulmonology. Patient reports continuing to smoke less than a pack per day.  Patient states that the humidity has affected his breathing.   Pt states hx of aphasia. States that during these episodes, he states that he gets cold and has a fever and has shaking. Episodes last for 1 hour. Patient states that he had a 48 hour EEG which was negative.  Review of Systems  Constitutional: Negative.   HENT: Negative.   Eyes: Negative.   Respiratory: Negative.   Cardiovascular: Negative.   Gastrointestinal: Negative.   Genitourinary: Negative.   Musculoskeletal: Positive for back pain (under left rib chronic).  Skin: Negative.   Neurological: Negative.   Psychiatric/Behavioral: Negative.      OBJECTIVE: BP (!) 122/56 (BP Location: Left Arm, Patient Position: Sitting, Cuff Size: Large)   Pulse 64   Temp 97.7 F (36.5 C) (Oral)   Resp 18   Ht 5\' 10"  (1.778 m)   Wt (!) 388 lb (176 kg)   SpO2 97%   BMI 55.67 kg/m   Wt Readings from Last 3 Encounters:  06/23/18 (!) 388 lb (176 kg)  10/29/17 (!) 397 lb (180.1 kg)  10/25/17 (!) 398 lb (180.5  kg)     Physical Exam Vitals signs and nursing note reviewed.  Constitutional:      General: He is not in acute distress.    Appearance: He is well-developed. He is obese.  HENT:     Head: Normocephalic and atraumatic.  Eyes:     Conjunctiva/sclera: Conjunctivae normal.     Pupils: Pupils are equal, round, and reactive to light.  Neck:     Musculoskeletal: Normal range of motion.  Cardiovascular:     Rate and Rhythm: Normal rate and regular rhythm.     Heart sounds: Normal heart sounds.  Pulmonary:     Effort: Pulmonary effort is normal. No respiratory distress.     Breath sounds: Normal breath sounds.  Abdominal:     General: Bowel sounds are normal. There is no distension.     Palpations: Abdomen is soft.  Musculoskeletal: Normal range of motion.  Skin:    General: Skin is warm and dry.  Neurological:     Mental Status: He is alert and oriented to person, place, and time.  Psychiatric:        Behavior: Behavior normal.        Thought Content: Thought content normal.     ASSESSMENT/PLAN:  1. Hyperlipidemia, unspecified hyperlipidemia type Advised patient to restart atorvastatin.  Will also need to continue with diet and exercise to lose weight.  Labs pending. - Lipid Panel - Comprehensive metabolic panel  2. Chronic obstructive pulmonary  disease, unspecified COPD type (HCC) Encourage smoking cessation.  Patient not interested in quitting.  3. OSA on CPAP Advised patient to use CPAP as prescribed.  Also encouraged weight loss  4. Tobacco abuse Encourage smoking cessation.  Patient not interested in quitting at the present time.  5. Morbid obesity (HCC) Encouraged patient to lose weight through diet and exercise  6. Chronic anticoagulation Continue with warfarin.  Followed by hematology.  7. Aphasia Will refer to neurology and request records. - Ambulatory referral to Neurology    Return in about 6 months (around 12/22/2018).    The patient was given  clear instructions to go to ER or return to medical center if symptoms do not improve, worsen or new problems develop. The patient verbalized understanding and agreed with plan of care.   Ms. Freda Jackson. Riley Lam, FNP-BC [facility] [address] [place], [place] [address] [phone]

## 2018-06-23 NOTE — Progress Notes (Signed)
0

## 2018-06-24 LAB — COMPREHENSIVE METABOLIC PANEL
ALT: 16 IU/L (ref 0–44)
AST: 11 IU/L (ref 0–40)
Albumin/Globulin Ratio: 1.5 (ref 1.2–2.2)
Albumin: 4.3 g/dL (ref 3.5–5.5)
Alkaline Phosphatase: 83 IU/L (ref 39–117)
BUN/Creatinine Ratio: 11 (ref 9–20)
BUN: 7 mg/dL (ref 6–24)
Bilirubin Total: <0.2 mg/dL (ref 0.0–1.2)
CO2: 21 mmol/L (ref 20–29)
Calcium: 9.2 mg/dL (ref 8.7–10.2)
Chloride: 104 mmol/L (ref 96–106)
Creatinine, Ser: 0.64 mg/dL — ABNORMAL LOW (ref 0.76–1.27)
GFR calc Af Amer: 128 mL/min/{1.73_m2} (ref >59)
GFR calc non Af Amer: 111 mL/min/{1.73_m2} (ref >59)
Globulin, Total: 2.8 g/dL (ref 1.5–4.5)
Glucose: 93 mg/dL (ref 65–99)
Potassium: 3.9 mmol/L (ref 3.5–5.2)
Sodium: 141 mmol/L (ref 134–144)
Total Protein: 7.1 g/dL (ref 6.0–8.5)

## 2018-06-24 LAB — LIPID PANEL
Chol/HDL Ratio: 7.2 ratio — ABNORMAL HIGH (ref 0.0–5.0)
Cholesterol, Total: 244 mg/dL — ABNORMAL HIGH (ref 100–199)
HDL: 34 mg/dL — ABNORMAL LOW (ref >39)
LDL Calculated: 161 mg/dL — ABNORMAL HIGH (ref 0–99)
Triglycerides: 243 mg/dL — ABNORMAL HIGH (ref 0–149)
VLDL Cholesterol Cal: 49 mg/dL — ABNORMAL HIGH (ref 5–40)

## 2018-06-24 LAB — HEMOGLOBIN A1C
Est. average glucose Bld gHb Est-mCnc: 131 mg/dL
Hgb A1c MFr Bld: 6.2 % — ABNORMAL HIGH (ref 4.8–5.6)

## 2018-06-29 ENCOUNTER — Telehealth: Payer: Self-pay

## 2018-06-29 ENCOUNTER — Other Ambulatory Visit: Payer: Self-pay

## 2018-06-29 DIAGNOSIS — E785 Hyperlipidemia, unspecified: Secondary | ICD-10-CM

## 2018-06-29 MED ORDER — ATORVASTATIN CALCIUM 40 MG PO TABS: 40.0000 mg | ORAL_TABLET | Freq: Every day | ORAL | 1 refills | Status: DC

## 2018-06-29 NOTE — Telephone Encounter (Signed)
-----   Message from Mike Gip, FNP sent at 06/29/2018  3:23 PM EST ----- Patient with elevated triglycerides and cholesterol.  Will need to restart atorvastatin.  Also advised patient to continue to make an effort to lose weight through diet and exercise to help control cholesterol and triglyceride levels, help with sleep apnea, and overall wellbeing.  Also A1c has increased from 5.9-6.2 patient still is in prediabetes range.

## 2018-06-29 NOTE — Telephone Encounter (Signed)
Called and spoke with patient, advised that triglycerides and cholesterol were both elevated and that he needs to restart atorvastatin. Advised that his a1c has increased to 6.2. Recommended that he make an effort to lose weight and exercised to help with cholesterol and triglyceride levels and help with sleep apnea and overall wellbeing. Patient verbalized understanding and atorvastatin was sent into pharmacy. Thanks!

## 2018-07-29 ENCOUNTER — Ambulatory Visit (INDEPENDENT_AMBULATORY_CARE_PROVIDER_SITE_OTHER): Payer: Self-pay | Admitting: Adult Health

## 2018-07-29 ENCOUNTER — Encounter: Payer: Self-pay | Admitting: Adult Health

## 2018-07-29 VITALS — BP 118/74 | HR 64 | Ht 70.0 in | Wt 393.0 lb

## 2018-07-29 DIAGNOSIS — R911 Solitary pulmonary nodule: Secondary | ICD-10-CM

## 2018-07-29 DIAGNOSIS — Z9989 Dependence on other enabling machines and devices: Secondary | ICD-10-CM

## 2018-07-29 DIAGNOSIS — I5189 Other ill-defined heart diseases: Secondary | ICD-10-CM

## 2018-07-29 DIAGNOSIS — Z720 Tobacco use: Secondary | ICD-10-CM

## 2018-07-29 DIAGNOSIS — J441 Chronic obstructive pulmonary disease with (acute) exacerbation: Secondary | ICD-10-CM

## 2018-07-29 DIAGNOSIS — G4733 Obstructive sleep apnea (adult) (pediatric): Secondary | ICD-10-CM

## 2018-07-29 DIAGNOSIS — R918 Other nonspecific abnormal finding of lung field: Secondary | ICD-10-CM

## 2018-07-29 DIAGNOSIS — I2699 Other pulmonary embolism without acute cor pulmonale: Secondary | ICD-10-CM

## 2018-07-29 NOTE — Assessment & Plan Note (Signed)
Appears improved.  Patient will continue on Lasix as needed.  Low-salt diet.  Weight loss.

## 2018-07-29 NOTE — Assessment & Plan Note (Signed)
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  Doing well on Coumadin.  INR has been therapeutic.  Continue with Coumadin checks

## 2018-07-29 NOTE — Assessment & Plan Note (Signed)
Excellent control on CPAP.  Will adjust pressure and mask for comfort  Plan  Patient Instructions  Work on not smoking .   Decrease Prednisone 10mg  1/2 daily for 2 weeks then 1/2 every other day for 2 weeks and stop.  Continue on Budesonide Neb Twice daily   Continue on albuterol /ipratropium nebulizer 4 times daily. Continue on Coumadin . - Follow up with coumadin clinic  Report any signs of bleeding .  Adjust CPAP pressure to 12-18 cmH20 .  Continue on CPAP At bedtime  - keep up good work.  Work on healthy weight .  Do not drive if sleepy.  Alpha one test today .  Copy of recent CT chest.  Follow up with Dr. Vassie Loll  In 2-3 months and As needed   Please contact office for sooner follow up if symptoms do not improve or worsen or seek emergency care

## 2018-07-29 NOTE — Assessment & Plan Note (Signed)
Weight loss is encouraged 

## 2018-07-29 NOTE — Assessment & Plan Note (Signed)
Smoking cessation  

## 2018-07-29 NOTE — Assessment & Plan Note (Signed)
Right lower lobe nodule noted April 2018 Continue to follow serially.  Patient said he recently had a CT chest October 2019.  Will call for results

## 2018-07-29 NOTE — Addendum Note (Signed)
Addended by: Demetrio Lapping E on: 07/29/2018 11:17 AM   Modules accepted: Orders

## 2018-07-29 NOTE — Patient Instructions (Addendum)
Work on not smoking .   Decrease Prednisone 10mg  1/2 daily for 2 weeks then 1/2 every other day for 2 weeks and stop.  Continue on Budesonide Neb Twice daily   Continue on albuterol /ipratropium nebulizer 4 times daily. Continue on Coumadin . - Follow up with coumadin clinic  Report any signs of bleeding .  Adjust CPAP pressure to 12-18 cmH20 .  Continue on CPAP At bedtime  - keep up good work.  Work on healthy weight .  Do not drive if sleepy.  Alpha one test today .  Copy of recent CT chest.  Follow up with Dr. Vassie Loll  In 2-3 months and As needed   Please contact office for sooner follow up if symptoms do not improve or worsen or seek emergency care

## 2018-07-29 NOTE — Assessment & Plan Note (Addendum)
Moderate COPD - more restrictive component .  Ongoing smoking . Cessation discussed  Patient's breathing is stable.  Will taper slowly off of prednisone.  Patient education on chronic steroid use  Plan  Continue on budesonide nebulizer twice daily continue on DuoNeb 4 times daily Smoking cessation Weight loss

## 2018-07-29 NOTE — Progress Notes (Signed)
@Patient  ID: Timothy Thomas, male    DOB: March 08, 1966, 53 y.o.   MRN: [id number]  Chief Complaint  Patient presents with  . 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  CT Angioof 09/2016 shows right lower lobe 1.0 cm nodule that is stable on follow-up CT in 03/2017, left lower lobe nodule is calcified and noted to be present in the past  CT chest 05/2017 stale 12mm RLL nodule  Sleep study/CPAP titration June 2019 severe sleep apnea AHI 99/an hour, SP O2 low 83%, optimal control CPAP 13 cm H2O  07/29/2018 Follow up : COPD, PE, Lung nodule , OSA , smoker , D CHF  Patient presents for a follow-up.  He was last seen in May 2019.  Patient has moderate COPD.  He remains on Pulmicort nebulizer twice daily and Albuterol /Atrovent nebs 4 times daily.  Patient is currently on prednisone 10 mg daily.  Patient has had a flare of his COPD summer 2019.  Patient had called in and placed on low-dose prednisone at 10 mg with plan for appointment in July however did not keep his follow-up. Patient does continue to smoke.  Smoking cessation was discussed.  Patient education on chronic steroids. Patient says overall breathing is doing okay, no flare of cough or wheezing . Feels he is doing better than usual.   Patient has a known right lower lobe nodule measuring 9 mm.  First noted on CT April 2018.  Discussed that he will need serial follow-up. Says he had a CT chest in Oct 2019 at [facility] in [place] , copies requested.   Patient has underlying severe sleep  apnea.  He had a titration study June 2019 that showed optimal pressure at 13 cm H2O.   did require CPAP through patient assistance foundation.  Patient says he has been wearing his CPAP machine.  He never misses a night.  Patient feels rested.  Does state that he has been having some mask issues feels like his mask is too small and sometimes a pressure is not high enough.  Download shows excellent compliance with 100% usage.  Daily average usage at 6.5 hours.  Patient is on CPAP AutoSet 10 to 15 cm H2O.  AHI is 0.9.  Minimum leaks. He wants to order Mirage 2 nasal mask.   Patient has history of recurrent PE.  While on Xarelto.  He remains on Coumadin.  Last INR was 2.2 1/20. Next check in next week. Says it has been very regulated.  Patient denies any known bleeding.  Has a history of diastolic heart failure.  Is on Lasix 20-40 mg daily As needed  .  Says overall leg swelling has been doing much better. Takes if swelling or weight goes up .  Skin is doing much better with no more weeping like before.   Says he was admitted Oct 2019 in [place] , pulled muscle in anterior chest wall after coughing episode. Says it is still sore but is getting better.     Allergies  Allergen Reactions  . Codeine Nausea  And Vomiting    Low tolerance to narcotics  . Ketorolac Nausea And Vomiting    Vomiting      Immunization History  Administered Date(s) Administered  . Influenza,inj,Quad PF,6+ Mos 02/09/2017  . Pneumococcal Polysaccharide-23 10/02/2016    Past Medical History:  Diagnosis Date  . Aphasia    Transient - negative head CT and EEG with neurology workup February 2017 [facility])  . COPD (chronic obstructive pulmonary disease) (HCC)   . Glaucoma   . Macular degeneration   . Myocardial infarction (HCC)   . Obesity   . Obstructive sleep apnea   . Tachycardia-bradycardia syndrome (HCC)    Status post pacemaker  . Variant angina (HCC)    History of normal coronary arteries at cardiac  catheterization 2002 - vasospasm noted  . Vasovagal syncope     Tobacco History: Social History   Tobacco Use  Smoking Status Current Every Day Smoker  . Packs/day: 1.00  . Years: 40.00  . Pack years: 40.00  . Types: Cigarettes  . Start date: 09/26/1976  . Last attempt to quit: 06/25/2017  . Years since quitting: 1.0  Smokeless Tobacco Never Used  Tobacco Comment   currently smoking 0.5ppd as of 07/29/2018   Ready to quit: Not Answered Counseling given: Not Answered Comment: currently smoking 0.5ppd as of 07/29/2018   Outpatient Medications Prior to Visit  Medication Sig Dispense Refill  . albuterol (PROVENTIL HFA;VENTOLIN HFA) 108 (90 Base) MCG/ACT inhaler Inhale 2 puffs into the lungs every 6 (six) hours as needed for wheezing or shortness of breath. 1 Inhaler 5  . albuterol (PROVENTIL) (2.5 MG/3ML) 0.083% nebulizer solution Take 3 mLs (2.5 mg total) by nebulization every 6 (six) hours as needed for wheezing or shortness of breath. DX: J44.9 120 vial 5  . aspirin EC 81 MG tablet Take 81 mg by mouth daily.    Marland Kitchen atorvastatin (LIPITOR) 40 MG tablet Take 1 tablet (40 mg total) by mouth daily at 6 PM. 90 tablet 1  . budesonide (PULMICORT) 0.5 MG/2ML nebulizer solution Take 2 mLs (0.5 mg total) by nebulization 2 (two) times daily. 120 mL 12  . diphenhydramine-acetaminophen (TYLENOL PM) 25-500 MG TABS tablet Take 3 tablets by mouth at bedtime.     Tery Sanfilippo Sodium (COLACE PO) Take by mouth.    . furosemide (LASIX) 20 MG tablet 1-2 tabs daily for leg swelling 90 tablet 1  . ipratropium (ATROVENT) 0.02 % nebulizer solution Take 2.5 mLs (0.5 mg total) by nebulization 4 (four) times daily. DX: J44.9 225 mL 5  . predniSONE (DELTASONE) 10 MG tablet 1 tab daily until next appointment. 45 tablet 0  . warfarin (COUMADIN) 5 MG tablet Take 7.5 mg by mouth daily.      No facility-administered medications prior to visit.      Review of Systems:   Constitutional:   No  weight loss, night  sweats,  Fevers, chills, + fatigue, or  lassitude.  HEENT:   No headaches,  Difficulty swallowing,  Tooth/dental problems, or  Sore throat,                No sneezing, itching, ear ache, nasal congestion, post nasal drip,   CV:  No chest pain,  Orthopnea, PND, swelling in lower extremities, anasarca, dizziness, palpitations, syncope.   GI  No heartburn, indigestion, abdominal pain, nausea, vomiting, diarrhea, change in bowel habits, loss of appetite, bloody stools.   Resp:  No excess mucus, no productive cough,  No non-productive cough,  No  coughing up of blood.  No change in color of mucus.  No wheezing.  No chest wall deformity  Skin: no rash or lesions.  GU: no dysuria, change in color of urine, no urgency or frequency.  No flank pain, no hematuria   MS:  No joint pain or swelling.  No decreased range of motion.  No back pain.    Physical Exam  BP 118/74 (BP Location: Left Arm, Cuff Size: Normal)   Pulse 64   Ht 5\' 10"  (1.778 m)   Wt (!) 393 lb (178.3 kg)   SpO2 96%   BMI 56.39 kg/m   GEN: A/Ox3; pleasant , NAD, morbidly obese    HEENT:  Eschbach/AT,  EACs-clear, TMs-wnl, NOSE-clear, THROAT-clear, no lesions, no postnasal drip or exudate noted. Class 3 MP airway  Poor dentition   NECK:  Supple w/ fair ROM; no JVD; normal carotid impulses w/o bruits; no thyromegaly or nodules palpated; no lymphadenopathy.    RESP  Clear  P & A; w/o, wheezes/ rales/ or rhonchi. no accessory muscle use, no dullness to percussion  CARD:  RRR, no m/r/g, 1+  peripheral edema, pulses intact, no cyanosis or clubbing.  GI:   Soft & nt; nml bowel sounds; no organomegaly or masses detected.   Musco: Warm bil, no deformities or joint swelling noted.   Neuro: alert, no focal deficits noted.    Skin: Warm, no lesions or rashes    Lab Results:   BMET  BNP  Imaging: No results found.    No flowsheet data found.  No results found for: NITRICOXIDE      Assessment & Plan:   Chronic  obstructive pulmonary disease (HCC) Moderate COPD - more restrictive component .  Ongoing smoking . Cessation discussed  Patient's breathing is stable.  Will taper slowly off of prednisone.  Patient education on chronic steroid use  Plan  Continue on budesonide nebulizer twice daily continue on DuoNeb 4 times daily Smoking cessation Weight loss  Morbid obesity (HCC) Weight loss is encouraged  OSA on CPAP Excellent control on CPAP.  Will adjust pressure and mask for comfort  Plan  Patient Instructions  Work on not smoking .   Decrease Prednisone 10mg  1/2 daily for 2 weeks then 1/2 every other day for 2 weeks and stop.  Continue on Budesonide Neb Twice daily   Continue on albuterol /ipratropium nebulizer 4 times daily. Continue on Coumadin . - Follow up with coumadin clinic  Report any signs of bleeding .  Adjust CPAP pressure to 12-18 cmH20 .  Continue on CPAP At bedtime  - keep up good work.  Work on healthy weight .  Do not drive if sleepy.  Alpha one test today .  Copy of recent CT chest.  Follow up with Dr. Vassie Loll  In 2-3 months and As needed   Please contact office for sooner follow up if symptoms do not improve or worsen or seek emergency care        Tobacco abuse Smoking cessation  Pulmonary nodule Right lower lobe nodule noted April 2018 Continue to follow serially.  Patient said he recently had a CT chest October 2019.  Will call for results  Diastolic dysfunction Appears improved.  Patient will continue on Lasix as needed.  Low-salt diet.  Weight loss.  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  Doing well on Coumadin.  INR has been therapeutic.  Continue with Coumadin checks     Rubye Oaks, NP 07/29/2018

## 2018-08-03 ENCOUNTER — Telehealth: Payer: Self-pay | Admitting: Pulmonary Disease

## 2018-08-03 NOTE — Telephone Encounter (Signed)
I reviewed his CT chest from 03/2018 which shows stable right lower lobe nodule 6 x 5 mm groundglass.  This nodule has been noted since 2018 and has been stable. Almost certainly benign. We would recommend one-year follow-up in 03/2019 He also has a calcified granuloma left lung

## 2018-08-05 LAB — ALPHA-1 ANTITRYPSIN PHENOTYPE: A-1 Antitrypsin, Ser: 164 mg/dL (ref 83–199)

## 2018-08-09 NOTE — Progress Notes (Signed)
LMOMTCB x 1 

## 2018-08-10 ENCOUNTER — Telehealth: Payer: Self-pay | Admitting: Pulmonary Disease

## 2018-08-10 NOTE — Telephone Encounter (Signed)
I had issues with new phone # for [employer].  Spoke to [place] and she is going to check on order and call me back.

## 2018-08-10 NOTE — Telephone Encounter (Signed)
Left vm for [name] to see if she found out anything regarding pressure settings.

## 2018-08-10 NOTE — Telephone Encounter (Signed)
Pt returning calls [phone]

## 2018-08-10 NOTE — Telephone Encounter (Signed)
Called and spoke with pt letting him know the results of labwork that he had done. While speaking with pt, pt also wanted to know if Dr. Vassie Loll had been able to review the CT that he had done and stated to pt the info from phone encounter from RA on 08/03/2018.   Also, while speaking with pt, pt stated at last OV with TP, TP had increased his cpap settings but pt still has not heard anything from [facility] and settings have not been changed yet. Pt stated he has tried to call [facility] mutliple times but each time he tries to call, he is unable to get through.  I also tried to call Boneta Lucks with [facility] but I only got a busy signal. [facility], is there anything you can do to try to help pt out with trying to reach [facility] in regards to his CPAP settings needing to be changed. Order was placed 07/29/2018 and sent to [facility].

## 2018-08-10 NOTE — Telephone Encounter (Signed)
I am calling [facility]) to check on order.

## 2018-08-11 NOTE — Telephone Encounter (Signed)
[name] called & states Timothy Thomas has been taken care of.  I called him & he states he noticed on his machine yesterday afternoon the pressure had been changed.  Nothing further needed.

## 2018-08-12 ENCOUNTER — Other Ambulatory Visit: Payer: Self-pay | Admitting: Pulmonary Disease

## 2018-08-12 DIAGNOSIS — R911 Solitary pulmonary nodule: Secondary | ICD-10-CM

## 2018-08-12 NOTE — Progress Notes (Signed)
Spoke with pt and notified of results per Tammy Parrett, NP. Pt verbalized understanding and denied any questions. 

## 2018-08-12 NOTE — Progress Notes (Signed)
Spoke with pt and notified of results per Dr. Wert. Pt verbalized understanding and denied any questions. 

## 2018-08-25 ENCOUNTER — Ambulatory Visit: Payer: Self-pay | Admitting: Neurology

## 2018-10-04 NOTE — Telephone Encounter (Signed)
Message sent to provider 

## 2018-10-31 NOTE — Progress Notes (Signed)
Virtual Visit via Telephone Note  I connected with Timothy Thomas on 11/01/18 at 10:30 AM EDT by telephone and verified that I am speaking with the correct person using two identifiers.  Location: Patient: Home Provider: Office [facility] - [address] [place], [address], [place], [place] [address]   I discussed the limitations, risks, security and privacy concerns of performing an evaluation and management service by telephone and the availability of in person appointments. I also discussed with the patient that there may be a patient responsible charge related to this service. The patient expressed understanding and agreed to proceed.  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  40-pack-year smoking history.  Still currently smoking 7 to 8 cigarettes daily. Maintenance: Pulmicort nebs, scheduled albuterol and Atrovent nebs Patient of Dr. Vassie Loll  Chief complaint: 3 month follow up   53 year old male current every day smoker followed in our office for chronic cough, lung nodule (we are following this with a CT chest in October/2020), severe obstructive sleep apnea.  Patient also has a medical history significant for recurrent PE he is managed on Coumadin chronically.  Patient reports that CPAP therapy has been doing better since pressure changes occurred.  CPAP compliance report shows excellent compliance.  Compliance report listed below:  10/02/2018-10/31/2018- CPAP compliance report-30 in the last 30 days use, 28 of those days greater than 4 hours,  average usage 6 hours and 18 minutes, APAP settings 12-18, AHI 1.3  Patient reports that he has been adherent to his Pulmicort nebulized medications twice daily as well as Atrovent and albuterol scheduled every 6 hours.  Patient reports that his cough has worsened since we stopped his chronic prednisone in February/2020.  Patient reports that his shortness of breath is baseline.  See cough ROS below:   11/01/2018 - Cough ROS:  When to the symptoms start: March/2020 How are you today: Worse   Have you had fever/sore throat (first 5 to 7 days of URI) or Have you had cough/nasal congestion (10 to 14 days of URI) : Have you used anything to treat the cough, as anything improved: albuterol, atrovent nebs Is it a dry or wet cough: Dry Cough  Does the cough happen when your breathing or when you breathe out: unsure Other any triggers to your cough, or any aggravating factors: milk / dairy products   Daily antihistamine: none  GERD treatment: none Singulair: none   Cough checklist (bolded indicates presence):  Adherence, acid reflux, ACE inhibitor, active sinus disease, active smoking, adverse effects of medications (amiodarone/Macrodantin/bb), alpha 1, allergies, aspiration, anxiety, bronchiectasis, congestive heart failure (diastolic)  Patient is a current every day smoker.  Patient is smoking 7 to 8 cigarettes daily.  See smoking assessment and cessation counseling listed below:  Smoking assessment and cessation counseling  Patient currently smoking: 7-8 cigarettes daily  I have advised the patient to quit/stop smoking as soon as possible due to high risk for multiple medical problems.  It will also be very difficult for [place] to manage patient's  respiratory symptoms and status if we continue to expose her lungs  to a known irritant.  We do not advise e-cigarettes as a form of stopping smoking.  Patient is willing to quit smoking. Not ready to set quit date.   I have advised the patient that we  can assist and have options of nicotine replacement therapy, provided smoking cessation education today, provided smoking cessation counseling, and provided cessation resources.  Follow-up next office visit office visit for assessment of smoking cessation.  Smoking cessation counseling advised for: 4 min  Patient reports that his lower extremity swelling has been stable.  He reports that he does not weigh himself daily.  Last known weight was 388 pounds at his doctor's office visit in March/2020.  Last weight in our office was in February 2020 and it was 393 pounds.  Patient has a scale at home he just forgets to weigh.  We also are following pulmonary nodules with a CT in 03/2019    Observations/Objective:  Last MD visit march/2020 - weight - 388lbs  Spirometry12/2018 >>moderate restriction with ratio of 83, FEV1 of 70% and FVC of 65%.  CT Angioof 09/2016 shows right lower lobe 1.0 cm nodule that is stable on follow-up CT in 03/2017, left lower lobe nodule is calcified and noted to be present in the past  CT chest 05/2017 stale 62mm RLL nodule  03/2018 CT chest- stable right lower lobe nodule 6 x 5 mm groundglass, has been noted since 2018 and has been stable, calcified granuloma left lung  Sleep study/CPAP titration June 2019 severe sleep apnea AHI 99/an hour, SP O2 low 83%, optimal control CPAP 13 cm H2O    Assessment and Plan:  Pulmonary embolism (HCC) Assessment: History of recurrent PE Previously managed on Xarelto now managed on Coumadin Patient reports that shortness of breath is stable and at his baseline  Plan: Continue Coumadin as prescribed  OSA on CPAP Assessment: June/2019 sleep study showing severe sleep apnea with an AHI of 99 Last weight in chart is 393 pounds CPAP compliance report shows excellent compliance as well as a well-controlled AHI of 1.3  Plan: Continue CPAP therapy Follow-up with our office in 4 weeks  Chronic obstructive pulmonary disease  (HCC) Assessment: Current every day smoker, smoking 7 to 8 cigarettes December/2018 spirometry shows moderate restriction with a ratio of 83, FEV1 70%, FVC 65%  Plan: Continue Pulmicort nebs Continue Atrovent nebs Continue albuterol nebs 4-week follow-up with our office You need to stop smoking   Tobacco abuse Assessment: Current every day smoker, smoking 7 to 8 cigarettes a day 40-pack-year smoking history  Plan: You need to quit smoking Need to consider referral to lung cancer screening program when patient reaches age of 57  Pulmonary nodule Assessment: 03/2018 CT chest reviewed by Dr. Britt Bottom showing a 6 x 5 mm groundglass nodule  Plan: We will repeat CT chest in 03/2019  Morbid obesity (HCC) Assessment: Current weight is 393 pounds  Plan: Actively work on weight loss as well as diet and exercise to work on weight reduction May need to consider referral to medical weight management in the future  Diastolic dysfunction Assessment: Patient with history of diastolic dysfunction Patient has Lasix to use as needed 20 mg to 40 mg based off of lower extremity swelling Patient reporting that lower extremity swelling is stable and at baseline Patient does not weigh himself regularly  Plan: Instructed patient he needs to start weighing himself daily in the morning and keep track of dry weight Patient to take Lasix today Follow-up in our office in  4 weeks  Chronic cough Assessment: Flare of cough in March/2020 Patient reports that triggers are milk and dairy products Patient denies breakthrough reflux Current every day smoker Unknown status of weight as patient does not weigh himself at home  Plan: Empiric trial of omeprazole 20 mg as patient is having a dry cough and has triggers to milk and dairy products Patient to contact our office in 1 week and let us know how he is doing Start weighing yourself daily so we can track your dry weights and this can help assist  you with whether or not you need to take your Lasix more often Close follow-up with our office in 4 weeks   Follow Up Instructions:  Return in about 4 weeks (around 11/29/2018), or if symptoms worsen or fail to improve, for Follow up with Elisha Headland FNP-C.   I discussed the assessment and treatment plan with the patient. The patient was provided an opportunity to ask questions and all were answered. The patient agreed with the plan and demonstrated an understanding of the instructions.   The patient was advised to call back or seek an in-person evaluation if the symptoms worsen or if the condition fails to improve as anticipated.  I provided 30 minutes of non-face-to-face time during this encounter.   Coral Ceo, NP

## 2018-11-01 ENCOUNTER — Other Ambulatory Visit: Payer: Self-pay

## 2018-11-01 ENCOUNTER — Encounter: Payer: Self-pay | Admitting: Pulmonary Disease

## 2018-11-01 ENCOUNTER — Ambulatory Visit: Payer: Self-pay | Admitting: Pulmonary Disease

## 2018-11-01 ENCOUNTER — Ambulatory Visit (INDEPENDENT_AMBULATORY_CARE_PROVIDER_SITE_OTHER): Payer: Self-pay | Admitting: Pulmonary Disease

## 2018-11-01 DIAGNOSIS — G4733 Obstructive sleep apnea (adult) (pediatric): Secondary | ICD-10-CM

## 2018-11-01 DIAGNOSIS — J449 Chronic obstructive pulmonary disease, unspecified: Secondary | ICD-10-CM

## 2018-11-01 DIAGNOSIS — I5189 Other ill-defined heart diseases: Secondary | ICD-10-CM

## 2018-11-01 DIAGNOSIS — I2699 Other pulmonary embolism without acute cor pulmonale: Secondary | ICD-10-CM

## 2018-11-01 DIAGNOSIS — Z720 Tobacco use: Secondary | ICD-10-CM

## 2018-11-01 DIAGNOSIS — R911 Solitary pulmonary nodule: Secondary | ICD-10-CM

## 2018-11-01 DIAGNOSIS — F17210 Nicotine dependence, cigarettes, uncomplicated: Secondary | ICD-10-CM

## 2018-11-01 DIAGNOSIS — R05 Cough: Secondary | ICD-10-CM

## 2018-11-01 DIAGNOSIS — Z9989 Dependence on other enabling machines and devices: Secondary | ICD-10-CM

## 2018-11-01 DIAGNOSIS — R053 Chronic cough: Secondary | ICD-10-CM

## 2018-11-01 MED ORDER — OMEPRAZOLE 20 MG PO CPDR: 20.0000 mg | DELAYED_RELEASE_CAPSULE | Freq: Every day | ORAL | 4 refills | Status: DC

## 2018-11-01 NOTE — Assessment & Plan Note (Signed)
Assessment: Current every day smoker, smoking 7 to 8 cigarettes a day 40-pack-year smoking history  Plan: You need to quit smoking Need to consider referral to lung cancer screening program when patient reaches age of 32

## 2018-11-01 NOTE — Assessment & Plan Note (Signed)
Assessment: Current weight is 393 pounds  Plan: Actively work on weight loss as well as diet and exercise to work on weight reduction May need to consider referral to medical weight management in the future

## 2018-11-01 NOTE — Assessment & Plan Note (Signed)
Assessment: June/2019 sleep study showing severe sleep apnea with an AHI of 99 Last weight in chart is 393 pounds CPAP compliance report shows excellent compliance as well as a well-controlled AHI of 1.3  Plan: Continue CPAP therapy Follow-up with our office in 4 weeks

## 2018-11-01 NOTE — Assessment & Plan Note (Addendum)
Assessment: Current every day smoker, smoking 7 to 8 cigarettes December/2018 spirometry shows moderate restriction with a ratio of 83, FEV1 70%, FVC 65%  Plan: Continue Pulmicort nebs Continue Atrovent nebs Continue albuterol nebs 4-week follow-up with our office You need to stop smoking

## 2018-11-01 NOTE — Assessment & Plan Note (Signed)
Assessment: Patient with history of diastolic dysfunction Patient has Lasix to use as needed 20 mg to 40 mg based off of lower extremity swelling Patient reporting that lower extremity swelling is stable and at baseline Patient does not weigh himself regularly  Plan: Instructed patient he needs to start weighing himself daily in the morning and keep track of dry weight Patient to take Lasix today Follow-up in our office in 4 weeks

## 2018-11-01 NOTE — Assessment & Plan Note (Addendum)
Assessment: 03/2018 CT chest reviewed by Dr. Britt Bottom showing a 6 x 5 mm groundglass nodule  Plan: We will repeat CT chest in 03/2019

## 2018-11-01 NOTE — Assessment & Plan Note (Signed)
Assessment: Flare of cough in March/2020 Patient reports that triggers are milk and dairy products Patient denies breakthrough reflux Current every day smoker Unknown status of weight as patient does not weigh himself at home  Plan: Empiric trial of omeprazole 20 mg as patient is having a dry cough and has triggers to milk and dairy products Patient to contact our office in 1 week and let us know how he is doing Start weighing yourself daily so we can track your dry weights and this can help assist you with whether or not you need to take your Lasix more often Close follow-up with our office in 4 weeks

## 2018-11-01 NOTE — Assessment & Plan Note (Addendum)
Assessment: History of recurrent PE Previously managed on Xarelto now managed on Coumadin Patient reports that shortness of breath is stable and at his baseline  Plan: Continue Coumadin as prescribed

## 2018-11-01 NOTE — Patient Instructions (Addendum)
Continue Pulmicort nebs  Continue Atrovent nebs every 6 hours  Continue Albuterol nebs every 6 hours    Start Omeprazole 20 mg tablet  >>>Please take 1 tablet daily 15 minutes to 30 minutes before your first meal of the day as well as before your other medications >>>Try to take at the same time each day >>>take this medication daily  GERD management: >>>Avoid laying flat until 2 hours after meals >>>Elevate head of the bed including entire chest >>>Reduce size of meals and amount of fat, acid, spices, caffeine and sweets >>>If you are smoking, Please stop! >>>Decrease alcohol consumption >>>Work on maintaining a healthy weight with normal BMI    We recommend that you continue using your CPAP daily >>>Keep up the hard work using your device >>> Goal should be wearing this for the entire night that you are sleeping, at least 4 to 6 hours  Remember:  . Do not drive or operate heavy machinery if tired or drowsy.  . Please notify the supply company and office if you are unable to use your device regularly due to missing supplies or machine being broken.  . Work on maintaining a healthy weight and following your recommended nutrition plan  . Maintain proper daily exercise and movement  . Maintaining proper use of your device can also help improve management of other chronic illnesses such as: Blood pressure, blood sugars, and weight management.   BiPAP/ CPAP Cleaning:  >>>Clean weekly, with Dawn soap, and bottle brush.  Set up to air dry.   Continue Coumadin  Planned follow up CT Chest in October / 2020   We recommend that you stop smoking.  >>>You need to set a quit date >>>If you have friends or family who smoke, let them know you are trying to quit and not to smoke around you or in your living environment  Smoking Cessation Resources:  1 800 QUIT NOW  >>> Patient to call this resource and utilize it to help support her quit smoking >>> Keep up your hard work with stopping  smoking  You can also contact the [facility] >>>For smoking cessation classes call [phone]  We do not recommend using e-cigarettes as a form of stopping smoking  You can sign up for smoking cessation support texts and information:  >>>https://smokefree.gov/smokefreetxt      Patient Education for Congestive Heart Failure  Do the following things EVERY DAY:   1. Weigh yourself EVERY morning after you go to the bathroom but before you eat or drink anything. Write this number down in a weight log/diary.    2. Take your medicines as prescribed. If you have concerns about your medications, please call us before you stop taking them.    3. Eat low salt foods-Limit salt (sodium) to 2000 mg per day. This will help prevent your body from holding onto fluid. Read food labels as many processed foods have a lot of sodium, especially canned goods and prepackaged meats. If you would like some assistance choosing low sodium foods, we would be happy to set you up with a nutritionist.   4. Stay as active as you can everyday. Staying active will give you more energy and make your muscles stronger. Start with 5 minutes at a time and work your way up to 30 minutes a day. Break up your activities--do some in the morning and some in the afternoon. Start with 3 days per week and work your way up to 5 days as you can.  If you have chest pain, feel short of breath, dizzy, or lightheaded, STOP. If you don't feel better after a short rest, call 911. If you do feel better, call the office to let us know you have symptoms with exercise.   5. Limit all fluids for the day to less than 2 liters. Fluid includes all drinks, coffee, juice, ice chips, soup, jello, and all other liquids.   Return in about 4 weeks (around 11/29/2018), or if symptoms worsen or fail to improve, for Follow up with Elisha Headland FNP-C.   Coronavirus (COVID-19) Are you at risk?  Are you at risk for the Coronavirus  (COVID-19)?  To be considered HIGH RISK for Coronavirus (COVID-19), you have to meet the following criteria:  . Traveled to [place], [place], [place], [place] or [place]; or in the [place] to [place], [place], [place], or [place]; and have fever, cough, and shortness of breath within the last 2 weeks of travel OR . Been in close contact with a person diagnosed with COVID-19 within the last 2 weeks and have fever, cough, and shortness of breath . IF YOU DO NOT MEET THESE CRITERIA, YOU ARE CONSIDERED LOW RISK FOR COVID-19.  What to do if you are HIGH RISK for COVID-19?  Marland Kitchen If you are having a medical emergency, call 911. . Seek medical care right away. Before you go to a doctor's office, urgent care or emergency department, call ahead and tell them about your recent travel, contact with someone diagnosed with COVID-19, and your symptoms. You should receive instructions from your physician's office regarding next steps of care.  . When you arrive at healthcare provider, tell the healthcare staff immediately you have returned from visiting [place], [place], [place], [place] or [place]; or traveled in the [place] to [place], [place], [place], or [place]; in the last two weeks or you have been in close contact with a person diagnosed with COVID-19 in the last 2 weeks.   . Tell the health care staff about your symptoms: fever, cough and shortness of breath. . After you have been seen by a medical provider, you will be either: o Tested for (COVID-19) and discharged home on quarantine except to seek medical care if symptoms worsen, and asked to  - Stay home and avoid contact with others until you get your results (4-5 days)  - Avoid travel on public transportation if possible (such as bus, train, or airplane) or o Sent to the Emergency Department by EMS for evaluation, COVID-19 testing, and possible admission depending on your condition and test results.  What to do if you are LOW  RISK for COVID-19?  Reduce your risk of any infection by using the same precautions used for avoiding the common cold or flu:  Marland Kitchen Wash your hands often with soap and warm water for at least 20 seconds.  If soap and water are not readily available, use an alcohol-based hand sanitizer with at least 60% alcohol.  . If coughing or sneezing, cover your mouth and nose by coughing or sneezing into the elbow areas of your shirt or coat, into a tissue or into your sleeve (not your hands). . Avoid shaking hands with others and consider head nods or verbal greetings only. . Avoid touching your eyes, nose, or mouth with unwashed hands.  . Avoid close contact with people who are sick. . Avoid places or events with large numbers of people in one location, like concerts or sporting events. Marland Kitchen  Carefully consider travel plans you have or are making. . If you are planning any travel outside or inside the [place], visit the CDC's Travelers' Health webpage for the latest health notices. . If you have some symptoms but not all symptoms, continue to monitor at home and seek medical attention if your symptoms worsen. . If you are having a medical emergency, call 911.   ADDITIONAL HEALTHCARE OPTIONS FOR PATIENTS  [facility] / e-Visit: [email or web address]         [facility]: [phone]  [facility]: [phone]                   [facility]: [phone]           It is flu season:   >>> Best ways to protect herself from the flu: Receive the yearly flu vaccine, practice good hand hygiene washing with soap and also using hand sanitizer when available, eat a nutritious meals, get adequate rest, hydrate appropriately   Please contact the office if your symptoms worsen or you have concerns that you are not improving.   Thank you for choosing [facility] for your healthcare, and for allowing us to partner  with you on your healthcare journey. I am thankful to be able to provide care to you today.   Elisha HeadlandBrian  FNP-C    Health Risks of Smoking Smoking cigarettes is very bad for your health. Tobacco smoke has over 200 known poisons in it. It contains the poisonous gases nitrogen oxide and carbon monoxide. There are over 60 chemicals in tobacco smoke that cause cancer. Smoking is difficult to quit because a chemical in tobacco, called nicotine, causes addiction or dependence. When you smoke and inhale, nicotine is absorbed rapidly into the bloodstream through your lungs. Both inhaled and non-inhaled nicotine may be addictive. What are the risks of cigarette smoke? Cigarette smokers have an increased risk of many serious medical problems, including:  Lung cancer.  Lung disease, such as pneumonia, bronchitis, and emphysema.  Chest pain (angina) and heart attack because the heart is not getting enough oxygen.  Heart disease and peripheral blood vessel disease.  High blood pressure (hypertension).  Stroke.  Oral cancer, including cancer of the lip, mouth, or voice box.  Bladder cancer.  Pancreatic cancer.  Cervical cancer.  Pregnancy complications, including premature birth.  Stillbirths and smaller newborn babies, birth defects, and genetic damage to sperm.  Early menopause.  Lower estrogen level for women.  Infertility.  Facial wrinkles.  Blindness.  Increased risk of broken bones (fractures).  Senile dementia.  Stomach ulcers and internal bleeding.  Delayed wound healing and increased risk of complications during surgery.  Even smoking lightly shortens your life expectancy by several years. Because of secondhand smoke exposure, children of smokers have an increased risk of the following:  Sudden infant death syndrome (SIDS).  Respiratory infections.  Lung cancer.  Heart disease.  Ear infections. What are the benefits of quitting? There are many health  benefits of quitting smoking. Here are some of them:  Within days of quitting smoking, your risk of having a heart attack decreases, your blood flow improves, and your lung capacity improves. Blood pressure, pulse rate, and breathing patterns start returning to normal soon after quitting.  Within months, your lungs may clear up completely.  Quitting for 10 years reduces your risk of developing lung cancer and heart disease to almost that of a nonsmoker.  People who quit may see  an improvement in their overall quality of life. How do I quit smoking?     Smoking is an addiction with both physical and psychological effects, and longtime habits can be hard to change. Your health care provider can recommend:  Programs and community resources, which may include group support, education, or talk therapy.  Prescription medicines to help reduce cravings.  Nicotine replacement products, such as patches, gum, and nasal sprays. Use these products only as directed. Do not replace cigarette smoking with electronic cigarettes, which are commonly called e-cigarettes. The safety of e-cigarettes is not known, and some may contain harmful chemicals.  A combination of two or more of these methods. Where to find more information  American Lung Association: www.lung.org  American Cancer Society: www.cancer.org Summary  Smoking cigarettes is very bad for your health. Cigarette smokers have an increased risk of many serious medical problems, including several cancers, heart disease, and stroke.  Smoking is an addiction with both physical and psychological effects, and longtime habits can be hard to change.  By stopping right away, you can greatly reduce the risk of medical problems for you and your family.  To help you quit smoking, your health care provider can recommend programs, community resources, prescription medicines, and nicotine replacement products such as patches, gum, and nasal sprays. This  information is not intended to replace advice given to you by your health care provider. Make sure you discuss any questions you have with your health care provider. Document Released: 07/02/2004 Document Revised: 08/26/2017 Document Reviewed: 05/29/2016 Elsevier Interactive Patient Education  2019 Elsevier Inc.     Sleep Apnea Sleep apnea affects breathing during sleep. It causes breathing to stop for a short time or to become shallow. It can also increase the risk of:  Heart attack.  Stroke.  Being very overweight (obese).  Diabetes.  Heart failure.  Irregular heartbeat. The goal of treatment is to help you breathe normally again. What are the causes? There are three kinds of sleep apnea:  Obstructive sleep apnea. This is caused by a blocked or collapsed airway.  Central sleep apnea. This happens when the brain does not send the right signals to the muscles that control breathing.  Mixed sleep apnea. This is a combination of obstructive and central sleep apnea. The most common cause of this condition is a collapsed or blocked airway. This can happen if:  Your throat muscles are too relaxed.  Your tongue and tonsils are too large.  You are overweight.  Your airway is too small. What increases the risk?  Being overweight.  Smoking.  Having a small airway.  Being older.  Being male.  Drinking alcohol.  Taking medicines to calm yourself (sedatives or tranquilizers).  Having family members with the condition. What are the signs or symptoms?  Trouble staying asleep.  Being sleepy or tired during the day.  Getting angry a lot.  Loud snoring.  Headaches in the morning.  Not being able to focus your mind (concentrate).  Forgetting things.  Less interest in sex.  Mood swings.  Personality changes.  Feelings of sadness (depression).  Waking up a lot during the night to pee (urinate).  Dry mouth.  Sore throat. How is this diagnosed?  Your  medical history.  A physical exam.  A test that is done when you are sleeping (sleep study). The test is most often done in a sleep lab but may also be done at home. How is this treated?   Sleeping on your side.  Using a medicine to get rid of mucus in your nose (decongestant).  Avoiding the use of alcohol, medicines to help you relax, or certain pain medicines (narcotics).  Losing weight, if needed.  Changing your diet.  Not smoking.  Using a machine to open your airway while you sleep, such as: ? An oral appliance. This is a mouthpiece that shifts your lower jaw forward. ? A CPAP device. This device blows air through a mask when you breathe out (exhale). ? An EPAP device. This has valves that you put in each nostril. ? A BPAP device. This device blows air through a mask when you breathe in (inhale) and breathe out.  Having surgery if other treatments do not work. It is important to get treatment for sleep apnea. Without treatment, it can lead to:  High blood pressure.  Coronary artery disease.  In men, not being able to have an erection (impotence).  Reduced thinking ability. Follow these instructions at home: Lifestyle  Make changes that your doctor recommends.  Eat a healthy diet.  Lose weight if needed.  Avoid alcohol, medicines to help you relax, and some pain medicines.  Do not use any products that contain nicotine or tobacco, such as cigarettes, e-cigarettes, and chewing tobacco. If you need help quitting, ask your doctor. General instructions  Take over-the-counter and prescription medicines only as told by your doctor.  If you were given a machine to use while you sleep, use it only as told by your doctor.  If you are having surgery, make sure to tell your doctor you have sleep apnea. You may need to bring your device with you.  Keep all follow-up visits as told by your doctor. This is important. Contact a doctor if:  The machine that you were  given to use during sleep bothers you or does not seem to be working.  You do not get better.  You get worse. Get help right away if:  Your chest hurts.  You have trouble breathing in enough air.  You have an uncomfortable feeling in your back, arms, or stomach.  You have trouble talking.  One side of your body feels weak.  A part of your face is hanging down. These symptoms may be an emergency. Do not wait to see if the symptoms will go away. Get medical help right away. Call your local emergency services (911 in the [place] Do not drive yourself to the hospital. Summary  This condition affects breathing during sleep.  The most common cause is a collapsed or blocked airway.  The goal of treatment is to help you breathe normally while you sleep. This information is not intended to replace advice given to you by your health care provider. Make sure you discuss any questions you have with your health care provider. Document Released: 03/03/2008 Document Revised: 01/18/2018 Document Reviewed: 01/18/2018 Elsevier Interactive Patient Education  2019 Elsevier Inc.     Gastroesophageal Reflux Disease, Adult Gastroesophageal reflux (GER) happens when acid from the stomach flows up into the tube that connects the mouth and the stomach (esophagus). Normally, food travels down the esophagus and stays in the stomach to be digested. With GER, food and stomach acid sometimes move back up into the esophagus. You may have a disease called gastroesophageal reflux disease (GERD) if the reflux:  Happens often.  Causes frequent or very bad symptoms.  Causes problems such as damage to the esophagus. When this happens, the esophagus becomes sore and swollen (inflamed). Over time, GERD  can make small holes (ulcers) in the lining of the esophagus. What are the causes? This condition is caused by a problem with the muscle between the esophagus and the stomach. When this muscle is weak or not  normal, it does not close properly to keep food and acid from coming back up from the stomach. The muscle can be weak because of:  Tobacco use.  Pregnancy.  Having a certain type of hernia (hiatal hernia).  Alcohol use.  Certain foods and drinks, such as coffee, chocolate, onions, and peppermint. What increases the risk? You are more likely to develop this condition if you:  Are overweight.  Have a disease that affects your connective tissue.  Use NSAID medicines. What are the signs or symptoms? Symptoms of this condition include:  Heartburn.  Difficult or painful swallowing.  The feeling of having a lump in the throat.  A bitter taste in the mouth.  Bad breath.  Having a lot of saliva.  Having an upset or bloated stomach.  Belching.  Chest pain. Different conditions can cause chest pain. Make sure you see your doctor if you have chest pain.  Shortness of breath or noisy breathing (wheezing).  Ongoing (chronic) cough or a cough at night.  Wearing away of the surface of teeth (tooth enamel).  Weight loss. How is this treated? Treatment will depend on how bad your symptoms are. Your doctor may suggest:  Changes to your diet.  Medicine.  Surgery. Follow these instructions at home: Eating and drinking   Follow a diet as told by your doctor. You may need to avoid foods and drinks such as: ? Coffee and tea (with or without caffeine). ? Drinks that contain alcohol. ? Energy drinks and sports drinks. ? Bubbly (carbonated) drinks or sodas. ? Chocolate and cocoa. ? Peppermint and mint flavorings. ? Garlic and onions. ? Horseradish. ? Spicy and acidic foods. These include peppers, chili powder, curry powder, vinegar, hot sauces, and BBQ sauce. ? Citrus fruit juices and citrus fruits, such as oranges, lemons, and limes. ? Tomato-based foods. These include red sauce, chili, salsa, and pizza with red sauce. ? Fried and fatty foods. These include donuts, french  fries, potato chips, and high-fat dressings. ? High-fat meats. These include hot dogs, rib eye steak, sausage, ham, and bacon. ? High-fat dairy items, such as whole milk, butter, and cream cheese.  Eat small meals often. Avoid eating large meals.  Avoid drinking large amounts of liquid with your meals.  Avoid eating meals during the 2-3 hours before bedtime.  Avoid lying down right after you eat.  Do not exercise right after you eat. Lifestyle   Do not use any products that contain nicotine or tobacco. These include cigarettes, e-cigarettes, and chewing tobacco. If you need help quitting, ask your doctor.  Try to lower your stress. If you need help doing this, ask your doctor.  If you are overweight, lose an amount of weight that is healthy for you. Ask your doctor about a safe weight loss goal. General instructions  Pay attention to any changes in your symptoms.  Take over-the-counter and prescription medicines only as told by your doctor. Do not take aspirin, ibuprofen, or other NSAIDs unless your doctor says it is okay.  Wear loose clothes. Do not wear anything tight around your waist.  Raise (elevate) the head of your bed about 6 inches (15 cm).  Avoid bending over if this makes your symptoms worse.  Keep all follow-up visits as told by  your doctor. This is important. Contact a doctor if:  You have new symptoms.  You lose weight and you do not know why.  You have trouble swallowing or it hurts to swallow.  You have wheezing or a cough that keeps happening.  Your symptoms do not get better with treatment.  You have a hoarse voice. Get help right away if:  You have pain in your arms, neck, jaw, teeth, or back.  You feel sweaty, dizzy, or light-headed.  You have chest pain or shortness of breath.  You throw up (vomit) and your throw-up looks like blood or coffee grounds.  You pass out (faint).  Your poop (stool) is bloody or black.  You cannot swallow,  drink, or eat. Summary  If a person has gastroesophageal reflux disease (GERD), food and stomach acid move back up into the esophagus and cause symptoms or problems such as damage to the esophagus.  Treatment will depend on how bad your symptoms are.  Follow a diet as told by your doctor.  Take all medicines only as told by your doctor. This information is not intended to replace advice given to you by your health care provider. Make sure you discuss any questions you have with your health care provider. Document Released: 11/11/2007 Document Revised: 12/01/2017 Document Reviewed: 12/01/2017 Elsevier Interactive Patient Education  2019 [employer].     Food Choices for Gastroesophageal Reflux Disease, Adult When you have gastroesophageal reflux disease (GERD), the foods you eat and your eating habits are very important. Choosing the right foods can help ease your discomfort. Think about working with a nutrition specialist (dietitian) to help you make good choices. What are tips for following this plan?  Meals  Choose healthy foods that are low in fat, such as fruits, vegetables, whole grains, low-fat dairy products, and lean meat, fish, and poultry.  Eat small meals often instead of 3 large meals a day. Eat your meals slowly, and in a place where you are relaxed. Avoid bending over or lying down until 2-3 hours after eating.  Avoid eating meals 2-3 hours before bed.  Avoid drinking a lot of liquid with meals.  Cook foods using methods other than frying. Bake, grill, or broil food instead.  Avoid or limit: ? Chocolate. ? Peppermint or spearmint. ? Alcohol. ? Pepper. ? Black and decaffeinated coffee. ? Black and decaffeinated tea. ? Bubbly (carbonated) soft drinks. ? Caffeinated energy drinks and soft drinks.  Limit high-fat foods such as: ? Fatty meat or fried foods. ? Whole milk, cream, butter, or ice cream. ? Nuts and nut butters. ? Pastries, donuts, and sweets made  with butter or shortening.  Avoid foods that cause symptoms. These foods may be different for everyone. Common foods that cause symptoms include: ? Tomatoes. ? Oranges, lemons, and limes. ? Peppers. ? Spicy food. ? Onions and garlic. ? Vinegar. Lifestyle  Maintain a healthy weight. Ask your doctor what weight is healthy for you. If you need to lose weight, work with your doctor to do so safely.  Exercise for at least 30 minutes for 5 or more days each week, or as told by your doctor.  Wear loose-fitting clothes.  Do not smoke. If you need help quitting, ask your doctor.  Sleep with the head of your bed higher than your feet. Use a wedge under the mattress or blocks under the bed frame to raise the head of the bed. Summary  When you have gastroesophageal reflux disease (GERD), food and  lifestyle choices are very important in easing your symptoms.  Eat small meals often instead of 3 large meals a day. Eat your meals slowly, and in a place where you are relaxed.  Limit high-fat foods such as fatty meat or fried foods.  Avoid bending over or lying down until 2-3 hours after eating.  Avoid peppermint and spearmint, caffeine, alcohol, and chocolate. This information is not intended to replace advice given to you by your health care provider. Make sure you discuss any questions you have with your health care provider. Document Released: 11/24/2011 Document Revised: 06/30/2016 Document Reviewed: 06/30/2016 Elsevier Interactive Patient Education  2019 [employer].

## 2018-11-08 ENCOUNTER — Telehealth: Payer: Self-pay | Admitting: Pulmonary Disease

## 2018-11-08 MED ORDER — PREDNISONE 10 MG PO TABS: ORAL_TABLET | ORAL | 0 refills | Status: DC

## 2018-11-08 NOTE — Telephone Encounter (Signed)
Can offer:   Prednisone 10mg  tablet  >>>4 tabs for 2 days, then 3 tabs for 2 days, 2 tabs for 2 days, then 1 tab for 2 days, then stop >>>take with food  >>>take in the morning   Please place the order.    Keep follow up in office. Or can see if RA has any clinic days coming up. Will evaluate when we see patient in person to decide if we want to maintain chronic pred.   If symptoms worsen then present to ER or Urgent care.   Timothy Thomas

## 2018-11-08 NOTE — Telephone Encounter (Signed)
LMTCB Per last ov with BM last week:11/01/18 Patient reports that he has been adherent to his Pulmicort nebulized medications twice daily as well as Atrovent and albuterol scheduled every 6 hours.  Patient reports that his cough has worsened since we stopped his chronic prednisone in February/2020.  Patient reports that his shortness of breath is baseline.  See cough ROS below:

## 2018-11-08 NOTE — Telephone Encounter (Signed)
Primary Pulmonologist: RA Last office visit and with whom: 11/01/2018 with Elisha Headland What do we see them for (pulmonary problems): COPD/OSA Last OV assessment/plan: Instructions  Return in about 4 weeks (around 11/29/2018), or if symptoms worsen or fail to improve, for Follow up with Elisha Headland FNP-C.  Continue Pulmicort nebs  Continue Atrovent nebs every 6 hours  Continue Albuterol nebs every 6 hours    Start Omeprazole 20 mg tablet  >>>Please take 1 tablet daily 15 minutes to 30 minutes before your first meal of the day as well as before your other medications >>>Try to take at the same time each day >>>take this medication daily  GERD management: >>>Avoid laying flat until 2 hours after meals >>>Elevate head of the bed including entire chest >>>Reduce size of meals and amount of fat, acid, spices, caffeine and sweets >>>If you are smoking, Please stop! >>>Decrease alcohol consumption >>>Work on maintaining a healthy weight with normal BMI    We recommend that you continue using your CPAP daily >>>Keep up the hard work using your device >>> Goal should be wearing this for the entire night that you are sleeping, at least 4 to 6 hours  Remember:   Do not drive or operate heavy machinery if tired or drowsy.   Please notify the supply company and office if you are unable to use your device regularly due to missing supplies or machine being broken.   Work on maintaining a healthy weight and following your recommended nutrition plan   Maintain proper daily exercise and movement   Maintaining proper use of your device can also help improve management of other chronic illnesses such as: Blood pressure, blood sugars, and weight management.   BiPAP/ CPAP Cleaning:  >>>Clean weekly, with Dawn soap, and bottle brush.  Set up to air dry.   Continue Coumadin  Planned follow up CT Chest in October / 2020       Was appointment offered to patient (explain)?  Pt requesting  refill of prednisone   Reason for call: Called and spoke with pt's wife Kennyth Arnold who stated pt is requesting a refill of prednisone. Per Kennyth Arnold, pt has been having some mild tightness in chest x2 days and has also been coughing which is not coughing up any mucus.    Pt denies any complaints of fever or increased SOB. Also denies any complaints of body aches or chills.  Pt is still doing neb treatments as prescribed. Pt has had to use rescue inhaler twice daily to help with his symptoms.  Due to pt's symptoms, pt is wanting to be put back on prednisone after he had been taken off of it in February. Arlys John, please advise if you are okay with [place] sending Rx for prednisone to pt's pharmacy as they want this to try to help keep pt out of the hospital.

## 2018-11-08 NOTE — Telephone Encounter (Signed)
Called and spoke with pt's wife Kennyth Arnold stating to her that we were going to send pred taper to pharmacy for pt and then at pt's upcoming OV with Arlys John 6/18, Arlys John will reevaluate if pt needs to be on maintenance prednisone. Stacy expressed understanding. Stated to [facility] if pt did worsen pt would need to either present to ER or urgent care and she verbalized understanding. Rx has been sent to pt's preferred pharmacy. Nothing further needed.

## 2018-11-08 NOTE — Telephone Encounter (Signed)
Patient is returning phone call.  Patient phone number is [phone].

## 2018-11-23 ENCOUNTER — Telehealth: Payer: Self-pay | Admitting: Pulmonary Disease

## 2018-11-23 MED ORDER — BUDESONIDE 0.5 MG/2ML IN SUSP: 0.5000 mg | Freq: Two times a day (BID) | RESPIRATORY_TRACT | 12 refills | Status: DC

## 2018-11-23 NOTE — Telephone Encounter (Signed)
Refill of budesonide neb sol has been sent to pt's preferred pharmacy. Called and spoke with pt's wife letting her know this had been done and she verbalized understanding. Nothing further needed.

## 2018-11-24 ENCOUNTER — Ambulatory Visit: Payer: Self-pay | Admitting: Pulmonary Disease

## 2018-11-28 ENCOUNTER — Ambulatory Visit (INDEPENDENT_AMBULATORY_CARE_PROVIDER_SITE_OTHER): Payer: Self-pay | Admitting: Pulmonary Disease

## 2018-11-28 ENCOUNTER — Encounter: Payer: Self-pay | Admitting: Pulmonary Disease

## 2018-11-28 ENCOUNTER — Other Ambulatory Visit: Payer: Self-pay

## 2018-11-28 VITALS — BP 128/68 | HR 74 | Ht 70.0 in | Wt 399.8 lb

## 2018-11-28 DIAGNOSIS — R911 Solitary pulmonary nodule: Secondary | ICD-10-CM

## 2018-11-28 DIAGNOSIS — G4733 Obstructive sleep apnea (adult) (pediatric): Secondary | ICD-10-CM

## 2018-11-28 DIAGNOSIS — I5189 Other ill-defined heart diseases: Secondary | ICD-10-CM

## 2018-11-28 DIAGNOSIS — R053 Chronic cough: Secondary | ICD-10-CM

## 2018-11-28 DIAGNOSIS — Z9989 Dependence on other enabling machines and devices: Secondary | ICD-10-CM

## 2018-11-28 DIAGNOSIS — Z720 Tobacco use: Secondary | ICD-10-CM

## 2018-11-28 DIAGNOSIS — R05 Cough: Secondary | ICD-10-CM

## 2018-11-28 DIAGNOSIS — I2699 Other pulmonary embolism without acute cor pulmonale: Secondary | ICD-10-CM

## 2018-11-28 DIAGNOSIS — F17210 Nicotine dependence, cigarettes, uncomplicated: Secondary | ICD-10-CM

## 2018-11-28 DIAGNOSIS — J449 Chronic obstructive pulmonary disease, unspecified: Secondary | ICD-10-CM

## 2018-11-28 MED ORDER — VARENICLINE TARTRATE 0.5 MG X 11 & 1 MG X 42 PO MISC: ORAL | 0 refills | Status: DC

## 2018-11-28 NOTE — Assessment & Plan Note (Addendum)
Plan: Continue to weigh yourself daily Take your Lasix as prescribed Follow-up with primary care as well as cardiology regarding lower extremity swelling May need to consider repeat echocardiogram

## 2018-11-28 NOTE — Assessment & Plan Note (Addendum)
Assessment: June/2019 sleep study shows severe obstructive sleep apnea with an AHI of 99 Weight in chart today is 399 pounds Mallampati 3 CPAP compliance report today shows excellent compliance with a well-controlled AHI of 1.5  Plan: Continue CPAP therapy Follow-up with our office in 2 months

## 2018-11-28 NOTE — Assessment & Plan Note (Signed)
Assessment: History of recurrent PE Previously managed on Xarelto now managed on Coumadin  Plan: Continue Coumadin

## 2018-11-28 NOTE — Assessment & Plan Note (Signed)
Plan: You need to stop smoking Start Chantix Continue Pulmicort nebs Continue Atrovent and albuterol nebs as scheduled

## 2018-11-28 NOTE — Assessment & Plan Note (Signed)
Assessment: Current every day smoker, smoking 7 to 8 cigarettes a day Has not set quit date 40-pack-year smoking history  Plan: You need to quit smoking You need to set a quit date Prescription for Chantix starter pack today Consider referral to lung cancer screening program when patient reaches the age of 60

## 2018-11-28 NOTE — Patient Instructions (Addendum)
Contact Cardiology for an appt  >>> do we need a repeat echo?   Keep follow up with PCP next week  Continue Pulmicort nebs  Continue Atrovent nebs every 6 hours  Continue Albuterol nebs every 6 hours    Start Omeprazole 40 mg tablet  >>>Please take 1 tablet daily 15 minutes to 30 minutes before your first meal of the day as well as before your other medications >>>Try to take at the same time each day >>>take this medication daily  GERD management: >>>Avoid laying flat until 2 hours after meals >>>Elevate head of the bed including entire chest >>>Reduce size of meals and amount of fat, acid, spices, caffeine and sweets >>>If you are smoking, Please stop! >>>Decrease alcohol consumption >>>Work on maintaining a healthy weight with normal BMI    We recommend that you continue using your CPAP daily >>>Keep up the hard work using your device >>> Goal should be wearing this for the entire night that you are sleeping, at least 4 to 6 hours  Remember:   Do not drive or operate heavy machinery if tired or drowsy.   Please notify the supply company and office if you are unable to use your device regularly due to missing supplies or machine being broken.   Work on maintaining a healthy weight and following your recommended nutrition plan   Maintain proper daily exercise and movement   Maintaining proper use of your device can also help improve management of other chronic illnesses such as: Blood pressure, blood sugars, and weight management.   BiPAP/ CPAP Cleaning:  >>>Clean weekly, with Dawn soap, and bottle brush.  Set up to air dry.   Continue Coumadin  Planned follow up CT Chest in October / 2020   We recommend that you stop smoking.  >>>You need to set a quit date >>>If you have friends or family who smoke, let them know you are trying to quit and not to smoke around you or in your living environment  Smoking Cessation Resources:  1 800 QUIT NOW  >>>  Patient to call this resource and utilize it to help support her quit smoking >>> Keep up your hard work with stopping smoking  You can also contact the [facility] >>>For smoking cessation classes call [phone]  We do not recommend using e-cigarettes as a form of stopping smoking  You can sign up for smoking cessation support texts and information:  >>>https://smokefree.gov/smokefreetxt   Start chantix - follow instructions on prescription    Patient Education for Congestive Heart Failure  Do the following things EVERY DAY:  1. Weigh yourself EVERY morning after you go to the bathroom but before you eat or drink anything. Write this number down in a weight log/diary.   2. Take your medicines as prescribed. If you have concerns about your medications, please call us before you stop taking them.   3. Eat low salt foods-Limit salt (sodium) to 2000 mg per day. This will help prevent your body from holding onto fluid. Read food labels as many processed foods have a lot of sodium, especially canned goods and prepackaged meats. If you would like some assistance choosing low sodium foods, we would be happy to set you up with a nutritionist.  4. Stay as active as you can everyday. Staying active will give you more energy and make your muscles stronger. Start with 5 minutes at a time and work your way up to 30 minutes a day. Break up your activities--do  some in the morning and some in the afternoon. Start with 3 days per week and work your way up to 5 days as you can. If you have chest pain, feel short of breath, dizzy, or lightheaded, STOP. If you don't feel better after a short rest, call 911. If you do feel better, call the office to let us know you have symptoms with exercise.  5. Limit all fluids for the day to less than 2 liters. Fluid includes all drinks, coffee, juice, ice chips, soup, jello, and all other liquids.    Return in about 2 months (around  01/28/2019), or if symptoms worsen or fail to improve, for Follow up with Dr. Vassie Loll.    Coronavirus (COVID-19) Are you at risk?  Are you at risk for the Coronavirus (COVID-19)?  To be considered HIGH RISK for Coronavirus (COVID-19), you have to meet the following criteria:  . Traveled to [place], [place], [place], [place] or [place]; or in the [place] to [place], [place], [place], or [place]; and have fever, cough, and shortness of breath within the last 2 weeks of travel OR . Been in close contact with a person diagnosed with COVID-19 within the last 2 weeks and have fever, cough, and shortness of breath . IF YOU DO NOT MEET THESE CRITERIA, YOU ARE CONSIDERED LOW RISK FOR COVID-19.  What to do if you are HIGH RISK for COVID-19?  Marland Kitchen If you are having a medical emergency, call 911. . Seek medical care right away. Before you go to a doctor's office, urgent care or emergency department, call ahead and tell them about your recent travel, contact with someone diagnosed with COVID-19, and your symptoms. You should receive instructions from your physician's office regarding next steps of care.  . When you arrive at healthcare provider, tell the healthcare staff immediately you have returned from visiting [place], [place], [place], [place] or [place]; or traveled in the [place] to [place], [place], [place], or [place]; in the last two weeks or you have been in close contact with a person diagnosed with COVID-19 in the last 2 weeks.   . Tell the health care staff about your symptoms: fever, cough and shortness of breath. . After you have been seen by a medical provider, you will be either: o Tested for (COVID-19) and discharged home on quarantine except to seek medical care if symptoms worsen, and asked to  - Stay home and avoid contact with others until you get your results (4-5 days)  - Avoid travel on public transportation if possible (such as bus, train, or airplane) or o Sent  to the Emergency Department by EMS for evaluation, COVID-19 testing, and possible admission depending on your condition and test results.  What to do if you are LOW RISK for COVID-19?  Reduce your risk of any infection by using the same precautions used for avoiding the common cold or flu:  Marland Kitchen Wash your hands often with soap and warm water for at least 20 seconds.  If soap and water are not readily available, use an alcohol-based hand sanitizer with at least 60% alcohol.  . If coughing or sneezing, cover your mouth and nose by coughing or sneezing into the elbow areas of your shirt or coat, into a tissue or into your sleeve (not your hands). . Avoid shaking hands with others and consider head nods or verbal greetings only. . Avoid touching your eyes, nose, or mouth with unwashed hands.  Marland Kitchen  Avoid close contact with people who are sick. . Avoid places or events with large numbers of people in one location, like concerts or sporting events. . Carefully consider travel plans you have or are making. . If you are planning any travel outside or inside the [place], visit the CDC's Travelers' Health webpage for the latest health notices. . If you have some symptoms but not all symptoms, continue tKoreao monitor at home and seek medical attention if your symptoms worsen. . If you are having a medical emergency, call 911.   ADDITIONAL HEALTHCARE OPTIONS FOR PATIENTS  [facility] / e-Visit: [email or web address]         [facility]: [phone]  [facility]: [phone]                   [facility]: [phone]           It is flu season:   >>> Best ways to protect herself from the flu: Receive the yearly flu vaccine, practice good hand hygiene washing with soap and also using hand sanitizer when available, eat a nutritious meals, get adequate rest, hydrate appropriately   Please contact the office if your  symptoms worsen or you have concerns that you are not improving.   Thank you for choosing [facility] for your healthcare, and for allowing us to partner with you on your healthcare journey. I am thankful to be able to provide care to you today.   Elisha HeadlandBrian Nickia Boesen FNP-C       Varenicline oral tablets What is this medicine? VARENICLINE (var EN i kleen) is used to help people quit smoking. It is used with a patient support program recommended by your physician. This medicine may be used for other purposes; ask your health care provider or pharmacist if you have questions. COMMON BRAND NAME(S): Chantix What should I tell my health care provider before I take this medicine? They need to know if you have any of these conditions: -heart disease -if you often drink alcohol -kidney disease -mental illness -on hemodialysis -seizures -history of stroke -suicidal thoughts, plans, or attempt; a previous suicide attempt by you or a family member -an unusual or allergic reaction to varenicline, other medicines, foods, dyes, or preservatives -pregnant or trying to get pregnant -breast-feeding How should I use this medicine? Take this medicine by mouth after eating. Take with a full glass of water. Follow the directions on the prescription label. Take your doses at regular intervals. Do not take your medicine more often than directed. There are 3 ways you can use this medicine to help you quit smoking; talk to your health care professional to decide which plan is right for you: 1) you can choose a quit date and start this medicine 1 week before the quit date, or, 2) you can start taking this medicine before you choose a quit date, and then pick a quit date between day 8 and 35 days of treatment, or, 3) if you are not sure that you are able or willing to quit smoking right away, start taking this medicine and slowly decrease the amount you smoke as directed by your health care professional with  the goal of being cigarette-free by week 12 of treatment. Stick to your plan; ask about support groups or other ways to help you remain cigarette-free. If you are motivated to quit smoking and did not succeed during a previous attempt with this medicine for reasons other than side  effects, or if you returned to smoking after this treatment, speak with your health care professional about whether another course of this medicine may be right for you. A special MedGuide will be given to you by the pharmacist with each prescription and refill. Be sure to read this information carefully each time. Talk to your pediatrician regarding the use of this medicine in children. This medicine is not approved for use in children. Overdosage: If you think you have taken too much of this medicine contact a poison control center or emergency room at once. NOTE: This medicine is only for you. Do not share this medicine with others. What if I miss a dose? If you miss a dose, take it as soon as you can. If it is almost time for your next dose, take only that dose. Do not take double or extra doses. What may interact with this medicine? -alcohol -insulin -other medicines used to help people quit smoking -theophylline -warfarin This list may not describe all possible interactions. Give your health care provider a list of all the medicines, herbs, non-prescription drugs, or dietary supplements you use. Also tell them if you smoke, drink alcohol, or use illegal drugs. Some items may interact with your medicine. What should I watch for while using this medicine? It is okay if you do not succeed at your attempt to quit and have a cigarette. You can still continue your quit attempt and keep using this medicine as directed. Just throw away your cigarettes and get back to your quit plan. Talk to your health care provider before using other treatments to quit smoking. Using this medicine with other treatments to quit smoking may  increase the risk for side effects compared to using a treatment alone. You may get drowsy or dizzy. Do not drive, use machinery, or do anything that needs mental alertness until you know how this medicine affects you. Do not stand or sit up quickly, especially if you are an older patient. This reduces the risk of dizzy or fainting spells. Decrease the number of alcoholic beverages that you drink during treatment with this medicine until you know if this medicine affects your ability to tolerate alcohol. Some people have experienced increased drunkenness (intoxication), unusual or sometimes aggressive behavior, or no memory of things that have happened (amnesia) during treatment with this medicine. Sleepwalking can happen during treatment with this medicine, and can sometimes lead to behavior that is harmful to you, other people, or property. Stop taking this medicine and tell your doctor if you start sleepwalking or have other unusual sleep-related activity. After taking this medicine, you may get up out of bed and do an activity that you do not know you are doing. The next morning, you may have no memory of this. Activities include driving a car ("sleep-driving"), making and eating food, talking on the phone, sexual activity, and sleep-walking. Serious injuries have occurred. Stop the medicine and call your doctor right away if you find out you have done any of these activities. Do not take this medicine if you have used alcohol that evening. Do not take it if you have taken another medicine for sleep. The risk of doing these sleep-related activities is higher. Patients and their families should watch out for new or worsening depression or thoughts of suicide. Also watch out for sudden changes in feelings such as feeling anxious, agitated, panicky, irritable, hostile, aggressive, impulsive, severely restless, overly excited and hyperactive, or not being able to sleep. If this happens, call  your health care  professional. If you have diabetes and you quit smoking, the effects of insulin may be increased and you may need to reduce your insulin dose. Check with your doctor or health care professional about how you should adjust your insulin dose. What side effects may I notice from receiving this medicine? Side effects that you should report to your doctor or health care professional as soon as possible: -allergic reactions like skin rash, itching or hives, swelling of the face, lips, tongue, or throat -acting aggressive, being angry or violent, or acting on dangerous impulses -breathing problems -changes in emotions or moods -chest pain or chest tightness -feeling faint or lightheaded, falls -hallucination, loss of contact with reality -mouth sores -redness, blistering, peeling or loosening of the skin, including inside the mouth -signs and symptoms of a stroke like changes in vision; confusion; trouble speaking or understanding; severe headaches; sudden numbness or weakness of the face, arm or leg; trouble walking; dizziness; loss of balance or coordination -seizures -sleepwalking -suicidal thoughts or other mood changes Side effects that usually do not require medical attention (report to your doctor or health care professional if they continue or are bothersome): -constipation -gas -headache -nausea, vomiting -strange dreams -trouble sleeping This list may not describe all possible side effects. Call your doctor for medical advice about side effects. You may report side effects to FDA at 1-800-FDA-1088. Where should I keep my medicine? Keep out of the reach of children. Store at room temperature between 15 and 30 degrees C (59 and 86 degrees F). Throw away any unused medicine after the expiration date. NOTE: This sheet is a summary. It may not cover all possible information. If you have questions about this medicine, talk to your doctor, pharmacist, or health care provider.  2019  Elsevier/Gold Standard (2017-11-19 12:48:08)

## 2018-11-28 NOTE — Assessment & Plan Note (Signed)
Assessment: 03/2018 CT chest reviewed by Dr. Elsworth Soho showing 6 x 5 mm groundglass nodule  Plan: Repeat CT chest in October/2020

## 2018-11-28 NOTE — Assessment & Plan Note (Signed)
Assessment: Flare of cough in March/2020 Patient reports triggers are milk and dairy products as well as temperature and weather changes Patient is a current everyday smoker, 7 to 8 cigarettes daily Patient started omeprazole 4 weeks ago has not noticed a significant improvement with his cough  Plan: Increase omeprazole to 40 mg daily Avoid all milk and dairy products Follow-up with primary care as well as cardiology regarding your fluid management and your Lasix Continue to weigh yourself daily You need to stop smoking Start Chantix to help with smoking cessation

## 2018-11-28 NOTE — Progress Notes (Signed)
 @Patient  ID: Timothy PiedraWilliam A Gergely, male    DOB: 12-02-1965, 53 y.o.   MRN: [id number]  Chief Complaint  Patient presents with  . 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  40-pack-year smoking history.  Still currently smoking 7 to 8 cigarettes daily. Maintenance: Pulmicort nebs, scheduled albuterol and Atrovent nebs Patient of Dr. Vassie LollAlva  11/28/2018  - Visit   53 year old male current everyday smoker presenting to our office today for 4 weeks follow-up visit regarding his chronic cough as well as dyspnea.  Patient continues to smoke 7 to 8 cigarettes a day.  Patient believes this is an improvement since his baseline prior to this was 1.5 packs/day.  Patient is interested in stopping smoking but he has not set a quit date.  Patient reports that he has not found success with nicotine replacement therapies in the past.  He is okay with considering a trial of Chantix.  Patient continues to be adherent to his Pulmicort nebs as well as scheduled albuterol and Atrovent nebulized medications.  After last office visit patient was started on omeprazole.  Patient continues to have cough that is worsened with dairy products such as milk and ice cream.  He reports that he eats he is very little but does have milk daily.  Patient has severe morbid obesity weight is up today at 399 pounds.  Patient has a history of pulmonary nodules which we are following with a CT scan of his chest in October/2020.  Patient has excellent compliance with CPAP.  See CPAP compliance report listed below:   10/26/2018-11/24/2018-CPAP compliance report-30 out of last 30 days use, all 30 those days greater than 4 hours, average usage 6 hours and 49 minutes, APAP setting 12-18, AHI 1.5   Tests:   Spirometry12/2018 >>moderate restriction with ratio of 83, FEV1 of 70% and FVC of 65%.  CT Angioof 09/2016 shows right lower lobe 1.0 cm nodule that is stable on follow-up CT in 03/2017, left lower lobe nodule is calcified and noted to be present in the past  CT chest 05/2017 stale 9mm RLL nodule  03/2018 CT chest- stable right lower lobe nodule 6 x 5 mm groundglass, has been noted since 2018 and has been stable, calcified granuloma left lung  Sleep study/CPAP titration June 2019 severe sleep apnea AHI 99/an hour, SP O2 low 83%, optimal control CPAP 13 cm H2O  11/01/2018 - Cough ROS:  When to the symptoms start: March/2020 How are you today: Worse   Have you had fever/sore throat (first 5 to 7 days of URI) or Have you had cough/nasal congestion (10 to 14 days of URI) : none  Have you used anything to treat the cough, as anything improved: albuterol, atrovent nebs Is it a dry or wet cough: Dry Cough  Does the cough happen when your breathing or when you breathe out: unsure Other any triggers to your cough, or any aggravating factors: milk / dairy products, changes in temperature   Daily antihistamine: none  GERD treatment: omeprazole  Singulair: none   Cough checklist (bolded indicates presence):  Adherence, acid reflux, ACE  inhibitor, active sinus disease, active smoking, adverse effects of medications (amiodarone/Macrodantin/bb), alpha 1, allergies, aspiration, anxiety, bronchiectasis, congestive heart failure (diastolic)  FENO:  No results found for: NITRICOXIDE  PFT: No flowsheet data found.  Imaging: No results found.    Specialty Problems      Pulmonary Problems   Chronic obstructive pulmonary disease (HCC)   Chronic cough    11/01/2018 - Cough ROS:  When to the symptoms  start: March/2020 How are you today: Worse   Have you had fever/sore throat (first 5 to 7 days of URI) or Have you had cough/nasal congestion (10 to 14 days of URI) : Have you used anything to treat the cough, as anything improved: albuterol, atrovent nebs Is it a dry or wet cough: Dry Cough  Does the cough happen when your breathing or when you breathe out: unsure Other any triggers to your cough, or any aggravating factors: milk / dairy products   Daily antihistamine: none  GERD treatment: none Singulair: none   Cough checklist (bolded indicates presence):  Adherence, acid reflux, ACE inhibitor, active sinus disease, active smoking, adverse effects of medications (amiodarone/Macrodantin/bb), alpha 1, allergies, aspiration, anxiety, bronchiectasis, congestive heart failure (diastolic)      OSA on CPAP    Sleep study/CPAP titration June 2019 severe sleep apnea AHI 99/an hour, SP O2 low 83%, optimal control CPAP 13 cm H2O       Pulmonary nodule    Right lower lobe nodule stable since 09/2016.  CT Angioof 09/2016 shows right lower lobe 1.0 cm nodule that is stable on follow-up CT in 03/2017, left lower lobe nodule is calcified and noted to be present in the past  CT chest 05/2017 stale 52mm RLL nodule  03/2018 CT chest- stable right lower lobe nodule 6 x 5 mm groundglass, has been noted since 2018 and has been stable, calcified granuloma left lung         Allergies  Allergen Reactions  . Codeine Nausea And Vomiting    Low tolerance to narcotics  . Ketorolac Nausea And Vomiting    Vomiting      Immunization History  Administered Date(s) Administered  . Influenza,inj,Quad PF,6+ Mos 02/09/2017  . Pneumococcal Polysaccharide-23 10/02/2016    Past Medical History:  Diagnosis Date  . Aphasia    Transient - negative head CT and EEG with neurology workup February 2017 [facility])  . COPD (chronic obstructive pulmonary disease) (HCC)   . Glaucoma   . Macular degeneration    . Myocardial infarction (HCC)   . Obesity   . Obstructive sleep apnea   . Tachycardia-bradycardia syndrome (HCC)    Status post pacemaker  . Variant angina (HCC)    History of normal coronary arteries at cardiac catheterization 2002 - vasospasm noted  . Vasovagal syncope     Tobacco History: Social History   Tobacco Use  Smoking Status Current Every Day Smoker  . Packs/day: 0.50  . Years: 40.00  . Pack years: 20.00  . Types: Cigarettes  . Start date: 09/26/1976  . Last attempt to quit: 06/25/2017  . Years since quitting: 1.4  Smokeless Tobacco Never Used  Tobacco Comment   currently smoking 0.5ppd as of 07/29/2018   Ready to quit: No Counseling given: Yes Comment: currently smoking 0.5ppd as of 07/29/2018   Smoking assessment and cessation counseling  Patient currently smoking: 7 to 8 cigarettes daily I have advised the patient to quit/stop smoking as soon as possible due to high risk for multiple medical problems.  It will also be very difficult for us to manage patient's  respiratory symptoms and status if we continue to expose her lungs to a known irritant.  We do not advise e-cigarettes as a form of stopping smoking.  Patient is willing to quit smoking.  Has not set a quit date  I have advised the patient that we can assist and have options of nicotine replacement therapy, provided smoking cessation education today, provided smoking cessation counseling, and provided cessation resources.  Willing to try Chantix.  Patient found that nicotine replacement therapies did not help.  Follow-up next office visit office visit for assessment of smoking cessation.    Smoking cessation counseling advised for: 6 min    Outpatient Encounter Medications as of 11/28/2018  Medication Sig  . albuterol (PROVENTIL HFA;VENTOLIN HFA) 108 (90 Base) MCG/ACT inhaler Inhale 2 puffs into the lungs every 6 (six) hours as needed for wheezing or shortness of breath.  Marland Kitchen. albuterol (PROVENTIL) (2.5  MG/3ML) 0.083% nebulizer solution Take 3 mLs (2.5 mg total) by nebulization every 6 (six) hours as needed for wheezing or shortness of breath. DX: J44.9  . aspirin EC 81 MG tablet Take 81 mg by mouth daily.  Marland Kitchen. atorvastatin (LIPITOR) 40 MG tablet Take 1 tablet (40 mg total) by mouth daily at 6 PM.  . budesonide (PULMICORT) 0.5 MG/2ML nebulizer solution Take 2 mLs (0.5 mg total) by nebulization 2 (two) times daily.  . diphenhydramine-acetaminophen (TYLENOL PM) 25-500 MG TABS tablet Take 3 tablets by mouth at bedtime.   Tery Sanfilippo. Docusate Sodium (COLACE PO) Take by mouth.  . furosemide (LASIX) 20 MG tablet 1-2 tabs daily for leg swelling  . ipratropium (ATROVENT) 0.02 % nebulizer solution Take 2.5 mLs (0.5 mg total) by nebulization 4 (four) times daily. DX: J44.9  . omeprazole (PRILOSEC) 20 MG capsule Take 1 capsule (20 mg total) by mouth daily.  . predniSONE (DELTASONE) 10 MG tablet Take 4tabsx2days,3tabsx2days,2tabsx2days,1tabx2days,then stop  . warfarin (COUMADIN) 5 MG tablet Take 7.5 mg by mouth daily.   . varenicline (CHANTIX PAK) 0.5 MG X 11 & 1 MG X 42 tablet Take one 0.5 mg tablet by mouth once daily for 3 days, then increase to one 0.5 mg tablet twice daily for 4 days, then increase to one 1 mg tablet twice daily.   No facility-administered encounter medications on file as of 11/28/2018.      Review of Systems  Review of Systems  Constitutional: Positive for fatigue. Negative for activity change, chills, fever and unexpected weight change.  HENT: Negative for postnasal drip, rhinorrhea, sinus pressure, sinus pain, sneezing and sore throat.   Eyes: Negative.   Respiratory: Positive for cough and shortness of breath. Negative for wheezing.   Cardiovascular: Positive for chest pain and leg swelling. Negative for palpitations.  Gastrointestinal: Negative for diarrhea, nausea and vomiting.  Endocrine: Negative.   Musculoskeletal: Negative.   Skin: Negative.   Neurological: Negative for dizziness  and headaches.  Psychiatric/Behavioral: Negative.  Negative for dysphoric mood. The patient is not nervous/anxious.   All other systems reviewed and are negative.    Physical Exam  BP 128/68 (BP Location: Left Arm, Patient Position: Sitting, Cuff Size: Large)   Pulse 74   Ht 5\' 10"  (1.778 m)   Wt (!) 399 lb 12.8 oz (181.3 kg)   SpO2 96%   BMI 57.37 kg/m   Wt Readings from Last 5 Encounters:  11/28/18 (!) 399 lb 12.8 oz (181.3 kg)  07/29/18 (!) 393 lb (178.3 kg)  06/23/18 (!) 388 lb (176 kg)  10/29/17 (!) 397 lb (180.1 kg)  10/25/17 (!) 398 lb (180.5 kg)     Physical Exam  Constitutional: He is oriented to person, place, and time and well-developed, well-nourished, and in no distress. No distress.  Chronically ill morbidly obese adult male  HENT:  Head: Normocephalic and atraumatic.  Right Ear: Hearing, tympanic membrane, external ear and ear canal normal.  Left Ear: Hearing, tympanic membrane, external ear and ear canal normal.  Nose: Nose normal.  Mouth/Throat: Uvula is midline and oropharynx is clear and moist. No oropharyngeal exudate.  Mallampati 3  Eyes: Pupils are equal, round, and reactive to light.  Neck: Normal range of motion. Neck supple.  Cardiovascular: Normal rate and regular rhythm.  Distant heart tones, difficult to audibly hear likely due to patient's body habitus  Pulmonary/Chest: Effort normal. No accessory muscle usage. No respiratory distress. He has no decreased breath sounds. He has no wheezes. He has no rhonchi.  Diminished breath sounds throughout exam, likely due to body habitus  Abdominal: Soft. Bowel sounds are normal. There is no abdominal tenderness.  Obese abdomen  Musculoskeletal: Normal range of motion.        General: Edema (2+ lower extremity edema bilaterally) present.  Lymphadenopathy:    He has no cervical adenopathy.  Neurological: He is alert and oriented to person, place, and time. Gait normal.  Skin: Skin is warm and dry. He is  not diaphoretic. No erythema.  Psychiatric: Mood, memory, affect and judgment normal.  Nursing note and vitals reviewed.     Lab Results:  CBC    Component Value Date/Time   WBC 10.2 05/07/2017 1400   RBC 4.54 05/07/2017 1400   HGB 12.8 (L) 05/07/2017 1400   HCT 40.1 05/07/2017 1400   PLT 209 05/07/2017 1400   MCV 88.3 05/07/2017 1400   MCH 28.2 05/07/2017 1400   MCHC 31.9 05/07/2017 1400   RDW 14.3 05/07/2017 1400   LYMPHSABS 2,670 03/24/2017 1214   EOSABS 300 03/24/2017 1214   BASOSABS 80 03/24/2017 1214    BMET    Component Value Date/Time   NA 141 06/23/2018 1141   K 3.9 06/23/2018 1141   CL 104 06/23/2018 1141   CO2 21 06/23/2018 1141   GLUCOSE 93 06/23/2018 1141   GLUCOSE 121 (H) 08/25/2017 1144   BUN 7 06/23/2018 1141   CREATININE 0.64 (L) 06/23/2018 1141   CREATININE 0.69 (L) 03/24/2017 1214   CALCIUM 9.2 06/23/2018 1141   GFRNONAA 111 06/23/2018 1141   GFRNONAA 108 03/24/2017 1214   GFRAA 128 06/23/2018 1141   GFRAA 126 03/24/2017 1214    BNP    Component Value Date/Time   BNP 15.0 11/05/2016 1421    ProBNP    Component Value Date/Time   PROBNP 21.0 08/25/2017 1144      Assessment & Plan:   Pulmonary embolism (HCC) Assessment: History of recurrent PE Previously managed on Xarelto now managed on Coumadin  Plan: Continue Coumadin  OSA on CPAP Assessment: June/2019 sleep study shows severe obstructive sleep apnea with an AHI of 99 Weight in chart today is 399 pounds Mallampati 3 CPAP compliance report today shows excellent compliance with a well-controlled AHI of 1.5  Plan: Continue CPAP therapy Follow-up with our office in 2 months  Chronic obstructive pulmonary disease ([place]) Plan: You need to stop smoking Start Chantix Continue Pulmicort nebs Continue Atrovent and albuterol nebs as scheduled   Tobacco abuse Assessment: Current every day smoker, smoking 7  to 8 cigarettes a day Has not set quit date 40-pack-year smoking  history  Plan: You need to quit smoking You need to set a quit date Prescription for Chantix starter pack today Consider referral to lung cancer screening program when patient reaches the age of 15  Pulmonary nodule Assessment: 03/2018 CT chest reviewed by Dr. Vassie Loll showing 6 x 5 mm groundglass nodule  Plan: Repeat CT chest in October/2020  Morbid obesity (HCC) Assessment: Weight today is 399 pounds  Plan: Actively work on weight loss as well as diet and exercise to work on weight reduction May need to consider referral to medical weight management the future Discuss weight loss interventions with primary care Resume physical activity at [facility]  Diastolic dysfunction Plan: Continue to weigh yourself daily Take your Lasix as prescribed Follow-up with primary care as well as cardiology regarding lower extremity swelling May need to consider repeat echocardiogram   Chronic cough Assessment: Flare of cough in March/2020 Patient reports triggers are milk and dairy products as well as temperature and weather changes Patient is a current everyday smoker, 7 to 8 cigarettes daily Patient started omeprazole 4 weeks ago has not noticed a significant improvement with his cough  Plan: Increase omeprazole to 40 mg daily Avoid all milk and dairy products Follow-up with primary care as well as cardiology regarding your fluid management and your Lasix Continue to weigh yourself daily You need to stop smoking Start Chantix to help with smoking cessation    Return in about 2 months (around 01/28/2019), or if symptoms worsen or fail to improve, for Follow up with Dr. Vassie Loll.   Coral Ceo, NP 11/28/2018   This appointment was 42 minutes long with over 50% of the time in direct face-to-face patient care, assessment, plan of care, and follow-up.

## 2018-11-28 NOTE — Assessment & Plan Note (Signed)
Assessment: Weight today is 399 pounds  Plan: Actively work on weight loss as well as diet and exercise to work on weight reduction May need to consider referral to medical weight management the future Discuss weight loss interventions with primary care Resume physical activity at [facility]

## 2018-12-14 ENCOUNTER — Telehealth: Payer: Self-pay | Admitting: Pulmonary Disease

## 2018-12-14 NOTE — Telephone Encounter (Signed)
Primary Pulmonologist: RA Last office visit and with whom: 11/28/2018 with Wyn Quaker What do we see them for (pulmonary problems): OSA/COPD/tobacco abuse Last OV assessment/plan: Instructions    Return in about 2 months (around 01/28/2019), or if symptoms worsen or fail to improve, for Follow up with Dr. Elsworth Soho. Contact Cardiology for an appt  >>> do we need a repeat echo?   Keep follow up with PCP next week  Continue Pulmicort nebs  Continue Atrovent nebs every 6 hours  Continue Albuterol nebs every 6 hours    Start Omeprazole 40 mg tablet  >>>Please take 1 tablet daily 15 minutes to 30 minutesbeforeyour first meal of the day as well as before your other medications >>>Try to take at the same time each day >>>take this medication daily  GERD management: >>>Avoid laying flat until 2 hours after meals >>>Elevate head of the bed including entire chest >>>Reduce size of meals and amount of fat, acid, spices, caffeine and sweets >>>If you are smoking, Please stop! >>>Decrease alcohol consumption >>>Work on maintaining a healthy weight with normal BMI   We recommend that you continue using your CPAP daily >>>Keep up the hard work using your device >>> Goal should be wearing this for the entire night that you are sleeping, at least 4 to 6 hours  Remember:   Do not drive or operate heavy machinery if tired or drowsy.   Please notify the supply company and office if you are unable to use your device regularly due to missing supplies or machine being broken.   Work on maintaining a healthy weight and following your recommended nutrition plan   Maintain proper daily exercise and movement   Maintaining proper use of your device can also help improve management of other chronic illnesses such as: Blood pressure, blood sugars, and weight management.   BiPAP/ CPAP Cleaning:  >>>Clean weekly, with Dawn soap, and bottle brush. Set up to air dry.   Continue Coumadin   Planned follow up CT Chest in October / 2020     Was appointment offered to patient (explain)?  Pt wants refill of prednisone   Reason for call: Called and spoke with pt's wife Marzetta Board who stated that pt has been coughing and cough is a dry cough. Pt has been having some chest tightness. Pt does have some mild wheezing.  Asked Marzetta Board if pt was still smoking and she stated that pt is still smoking and she thinks he is now smoking a little less than 1/2 pack day.   Asked Marzetta Board if pt was able to take Chantix and she stated that pt did not as he was unable to afford it.  Pt does not have any fever.  Stated to [place] that we should schedule visit for pt with Aaron Edelman and she verbalized understanding. Pt has been scheduled televisit with Aaron Edelman tomorrow 7/9 at 10am. Nothing further needed.

## 2018-12-14 NOTE — Progress Notes (Signed)
Virtual Visit via Telephone Note  I connected with Timothy Thomas on 12/15/18 at 10:00 AM EDT by telephone and verified that I am speaking with the correct person using two identifiers.  Location: Patient: Home Provider: Office [facility] - [address] [place], [address], [place], [place] [address]   I discussed the limitations, risks, security and privacy concerns of performing an evaluation and management service by telephone and the availability of in person appointments. I also discussed with the patient that there may be a patient responsible charge related to this service. The patient expressed understanding and agreed to proceed.  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  40-pack-year smoking history.  Still currently smoking 7 to 8 cigarettes daily. Maintenance: Pulmicort nebs, scheduled albuterol and Atrovent nebs Patient of Dr. Vassie Loll  Chief complaint: Cough    53 year old male current every day smoker completing a tele-visit with our office today due to worsened cough.  Patient reports that this weekend he was around a campfire with family and friends and since then he has had increased chest tightness, audible wheezing, and worsened dry cough.  Patient denies any sort of productive sputum.  Patient denies fevers, chills, fatigue.  Patient continues to be adherent to Pulmicort nebs, albuterol nebs, Atrovent nebs.  Patient feels that he is having an asthma exacerbation and may need a course of steroids.  Patient has scheduled follow-up with  cardiology in October.  Patient has not contacted cardiology as instructed at last visit for a sooner follow-up as well as to be considered for potentially an echocardiogram.  Patient reports that he will contact their office today.  Patient is managed on CPAP therapy.  CPAP compliance report shows excellent compliance:  11/15/2018-12/14/2018-CPAP compliance report-30 out of last 30 days use, 29 of those days greater than 4 hours, average usage 6 hours and 34 minutes, APAP setting 12-18, AHI 1.1  Observations/Objective:  Last MD visit march/2020 - weight - 388lbs  Spirometry12/2018 >>moderate restriction with ratio of 83, FEV1 of 70% and FVC of 65%.  CT Angioof 09/2016 shows right lower lobe 1.0 cm nodule that is stable on follow-up CT in 03/2017, left lower lobe nodule is calcified and noted to be present in the past  CT chest 05/2017 stale 22mm RLL nodule  03/2018 CT chest- stable right lower lobe nodule 6 x 5 mm groundglass, has been noted since 2018 and has been stable, calcified granuloma left lung  Sleep study/CPAP titration June 2019 severe sleep apnea AHI 99/an hour, SP O2 low 83%, optimal control CPAP 13 cm H2O   Assessment and Plan:  Pulmonary embolism (HCC) Assessment: History of recurrent PE Previously managed on Xarelto now managed on Coumadin  Plan: Continue Coumadin   OSA on CPAP Assessment: June/2019 sleep study shows severe obstructive sleep apnea with an AHI of 99  Plan: Continue CPAP therapy  Chronic obstructive pulmonary disease (HCC) Assessment: Increased wheezing, dry cough, fatigue Patient continues to smoke Patient adherent to scheduled medications  Plan: Prednisone taper today We will hold off on antibiotics at this time as patient is not  having any sort of productive cough or bronchitis-like symptoms Patient knows to contact our office if he starts to have a productive cough or fevers/body aches/chills Patient needs to stop smoking, he knows  this Continue Pulmicort nebs Continue Atrovent and albuterol nebs as scheduled Keep scheduled follow-up with our office in August/2020  Tobacco abuse Assessment: Current every day smoker, smoking 8 cigarettes a day Has not set quit date Currently on Chantix 40-pack-year smoking history  Plan: You need to stop smoking It is essential for you to stop smoking in order to help with management of your breathing Consider referral to lung cancer screening program at age 25  Pulmonary nodule Assessment: 03/2018 CT chest reviewed by Dr. Elsworth Soho showing 6 x 5 mm groundglass nodule  Plan: Repeat CT chest in October/2020  Morbid obesity ([place]) Plan: Actively work on weight loss as well as diet and exercise to work on weight reduction Could consider referral to medical weight management in the future  Chronic cough Assessment: Recent flare of cough based off of trigger from exposure to smoke Current every day smoker Morbidly obese  Plan: Prednisone taper today   Follow Up Instructions:  Return in about 6 weeks (around 01/26/2019), or if symptoms worsen or fail to improve, for Follow up with Dr. Elsworth Soho.   I discussed the assessment and treatment plan with the patient. The patient was provided an opportunity to ask questions and all were answered. The patient agreed with the plan and demonstrated an understanding of the instructions.   The patient was advised to call back or seek an in-person evaluation if the symptoms worsen or if the condition fails to improve as anticipated.  I provided 23 minutes of non-face-to-face time during this encounter.   Lauraine Rinne, NP

## 2018-12-15 ENCOUNTER — Other Ambulatory Visit: Payer: Self-pay

## 2018-12-15 ENCOUNTER — Ambulatory Visit (INDEPENDENT_AMBULATORY_CARE_PROVIDER_SITE_OTHER): Payer: Self-pay | Admitting: Pulmonary Disease

## 2018-12-15 ENCOUNTER — Encounter: Payer: Self-pay | Admitting: Pulmonary Disease

## 2018-12-15 DIAGNOSIS — Z720 Tobacco use: Secondary | ICD-10-CM

## 2018-12-15 DIAGNOSIS — J449 Chronic obstructive pulmonary disease, unspecified: Secondary | ICD-10-CM

## 2018-12-15 DIAGNOSIS — R053 Chronic cough: Secondary | ICD-10-CM

## 2018-12-15 DIAGNOSIS — I2699 Other pulmonary embolism without acute cor pulmonale: Secondary | ICD-10-CM

## 2018-12-15 DIAGNOSIS — R911 Solitary pulmonary nodule: Secondary | ICD-10-CM

## 2018-12-15 DIAGNOSIS — R05 Cough: Secondary | ICD-10-CM

## 2018-12-15 DIAGNOSIS — G4733 Obstructive sleep apnea (adult) (pediatric): Secondary | ICD-10-CM

## 2018-12-15 MED ORDER — PREDNISONE 10 MG PO TABS: ORAL_TABLET | ORAL | 0 refills | Status: DC

## 2018-12-15 NOTE — Assessment & Plan Note (Signed)
Assessment: Current every day smoker, smoking 8 cigarettes a day Has not set quit date Currently on Chantix 40-pack-year smoking history  Plan: You need to stop smoking It is essential for you to stop smoking in order to help with management of your breathing Consider referral to lung cancer screening program at age 53

## 2018-12-15 NOTE — Assessment & Plan Note (Signed)
Plan: Actively work on weight loss as well as diet and exercise to work on weight reduction Could consider referral to medical weight management in the future

## 2018-12-15 NOTE — Assessment & Plan Note (Signed)
Assessment: Increased wheezing, dry cough, fatigue Patient continues to smoke Patient adherent to scheduled medications  Plan: Prednisone taper today We will hold off on antibiotics at this time as patient is not having any sort of productive cough or bronchitis-like symptoms Patient knows to contact our office if he starts to have a productive cough or fevers/body aches/chills Patient needs to stop smoking, he knows this Continue Pulmicort nebs Continue Atrovent and albuterol nebs as scheduled Keep scheduled follow-up with our office in August/2020

## 2018-12-15 NOTE — Assessment & Plan Note (Signed)
Assessment: June/2019 sleep study shows severe obstructive sleep apnea with an AHI of 99  Plan: Continue CPAP therapy

## 2018-12-15 NOTE — Patient Instructions (Addendum)
Prednisone 10mg  tablet  >>>4 tabs for 3 days, then 3 tabs for 3 days, 2 tabs for 3 days, then 1 tab for 3 days, then stop >>>take with food  >>>take in the morning   Contact Cardiology for an appt  >>> do we need a repeat echo?   Continue Pulmicort nebs  Continue Atrovent nebs every 6 hours  Continue Albuterol nebs every 6 hours    Continue Omeprazole 40 mg tablet  >>>Please take 1 tablet daily 15 minutes to 30 minutesbeforeyour first meal of the day as well as before your other medications >>>Try to take at the same time each day >>>take this medication daily  GERD management: >>>Avoid laying flat until 2 hours after meals >>>Elevate head of the bed including entire chest >>>Reduce size of meals and amount of fat, acid, spices, caffeine and sweets >>>If you are smoking, Please stop! >>>Decrease alcohol consumption >>>Work on maintaining a healthy weight with normal BMI   We recommend that you continue using your CPAP daily >>>Keep up the hard work using your device >>> Goal should be wearing this for the entire night that you are sleeping, at least 4 to 6 hours  Remember:   Do not drive or operate heavy machinery if tired or drowsy.   Please notify the supply company and office if you are unable to use your device regularly due to missing supplies or machine being broken.   Work on maintaining a healthy weight and following your recommended nutrition plan   Maintain proper daily exercise and movement   Maintaining proper use of your device can also help improve management of other chronic illnesses such as: Blood pressure, blood sugars, and weight management.   BiPAP/ CPAP Cleaning:  >>>Clean weekly, with Dawn soap, and bottle brush. Set up to air dry.   Continue Coumadin  Planned follow up CT Chest in October / 2020   We recommend that you stop smoking.  >>>You need to set a quit date >>>If you have friends or family who smoke, let them know  you are trying to quit and not to smoke around you or in your living environment  Smoking Cessation Resources:  1 800 QUIT NOW  >>> Patient to call this resource and utilize it to help support her quit smoking >>> Keep up your hard work with stopping smoking  You can also contact the [facility] >>>For smoking cessation classes call [phone]  We do not recommend using e-cigarettes as a form of stopping smoking  You can sign up for smoking cessation support texts and information:  >>>https://smokefree.gov/smokefreetxt    Patient Education for Congestive Heart Failure  Do the following things EVERY DAY:  1. Weigh yourself EVERY morningafter you go to the bathroom but before you eat or drink anything. Write this number down in a weight log/diary.   2. Take your medicines as prescribed. If you have concerns about your medications, please call us before you stop taking them.   3. Eat low salt foods-Limit salt (sodium) to 2000 mg per day. This will help prevent your body from holding onto fluid. Read food labels as many processed foods have a lot of sodium, especially canned goods and prepackaged meats. If you would like some assistance choosing low sodium foods, we would be happy to set you up with a nutritionist.  4. Stay as active as you can everyday. Staying active will give you more energy and make your muscles stronger. Start with 5 minutes at  a time and work your way up to 30 minutes a day. Break up your activities--do some in the morning and some in the afternoon. Start with 3 days per week and work your way up to 5 days as you can. If you have chest pain, feel short of breath, dizzy, or lightheaded, STOP. If you don't feel better after a short rest, call 911. If you do feel better, call the office to let us know you have symptoms with exercise.  5. Limit all fluids for the day to less than 2 liters. Fluid includes all drinks, coffee, juice, ice chips,  soup, jello, and all other liquids.     Return in about 6 weeks (around 01/26/2019), or if symptoms worsen or fail to improve, for Follow up with Dr. Elsworth Soho.

## 2018-12-15 NOTE — Assessment & Plan Note (Signed)
Assessment: History of recurrent PE Previously managed on Xarelto now managed on Coumadin  Plan: Continue Coumadin 

## 2018-12-15 NOTE — Assessment & Plan Note (Addendum)
Assessment: 03/2018 CT chest reviewed by Dr. Alva showing 6 x 5 mm groundglass nodule  Plan: Repeat CT chest in October/2020 

## 2018-12-15 NOTE — Assessment & Plan Note (Signed)
Assessment: Recent flare of cough based off of trigger from exposure to smoke Current every day smoker Morbidly obese  Plan: Prednisone taper today

## 2018-12-22 ENCOUNTER — Encounter: Payer: Self-pay | Admitting: Family Medicine

## 2018-12-22 ENCOUNTER — Telehealth: Payer: Self-pay | Admitting: Pulmonary Disease

## 2018-12-22 ENCOUNTER — Other Ambulatory Visit: Payer: Self-pay

## 2018-12-22 ENCOUNTER — Ambulatory Visit (INDEPENDENT_AMBULATORY_CARE_PROVIDER_SITE_OTHER): Payer: Self-pay | Admitting: Family Medicine

## 2018-12-22 VITALS — BP 134/51 | HR 68 | Temp 98.3°F | Resp 22 | Ht 70.0 in | Wt 393.0 lb

## 2018-12-22 DIAGNOSIS — G4733 Obstructive sleep apnea (adult) (pediatric): Secondary | ICD-10-CM

## 2018-12-22 DIAGNOSIS — Z23 Encounter for immunization: Secondary | ICD-10-CM

## 2018-12-22 DIAGNOSIS — E119 Type 2 diabetes mellitus without complications: Secondary | ICD-10-CM

## 2018-12-22 DIAGNOSIS — I5189 Other ill-defined heart diseases: Secondary | ICD-10-CM

## 2018-12-22 DIAGNOSIS — E785 Hyperlipidemia, unspecified: Secondary | ICD-10-CM

## 2018-12-22 DIAGNOSIS — J449 Chronic obstructive pulmonary disease, unspecified: Secondary | ICD-10-CM

## 2018-12-22 LAB — POCT GLYCOSYLATED HEMOGLOBIN (HGB A1C): Hemoglobin A1C: 6.8 % — AB (ref 4.0–5.6)

## 2018-12-22 MED ORDER — ALBUTEROL SULFATE (2.5 MG/3ML) 0.083% IN NEBU: 2.5000 mg | INHALATION_SOLUTION | Freq: Four times a day (QID) | RESPIRATORY_TRACT | 6 refills | Status: DC | PRN

## 2018-12-22 MED ORDER — FUROSEMIDE 20 MG PO TABS: ORAL_TABLET | ORAL | 1 refills | Status: DC

## 2018-12-22 MED ORDER — METFORMIN HCL 500 MG PO TABS: 500.0000 mg | ORAL_TABLET | Freq: Two times a day (BID) | ORAL | 3 refills | Status: DC

## 2018-12-22 NOTE — Patient Instructions (Addendum)
High Cholesterol  High cholesterol is a condition in which the blood has high levels of a white, waxy, fat-like substance (cholesterol). The human body needs small amounts of cholesterol. The liver makes all the cholesterol that the body needs. Extra (excess) cholesterol comes from the food that we eat. Cholesterol is carried from the liver by the blood through the blood vessels. If you have high cholesterol, deposits (plaques) may build up on the walls of your blood vessels (arteries). Plaques make the arteries narrower and stiffer. Cholesterol plaques increase your risk for heart attack and stroke. Work with your health care provider to keep your cholesterol levels in a healthy range. What increases the risk? This condition is more likely to develop in people who:  Eat foods that are high in animal fat (saturated fat) or cholesterol.  Are overweight.  Are not getting enough exercise.  Have a family history of high cholesterol. What are the signs or symptoms? There are no symptoms of this condition. How is this diagnosed? This condition may be diagnosed from the results of a blood test.  If you are older than age 20, your health care provider may check your cholesterol every 4-6 years.  You may be checked more often if you already have high cholesterol or other risk factors for heart disease. The blood test for cholesterol measures:  "Bad" cholesterol (LDL cholesterol). This is the main type of cholesterol that causes heart disease. The desired level for LDL is less than 100.  "Good" cholesterol (HDL cholesterol). This type helps to protect against heart disease by cleaning the arteries and carrying the LDL away. The desired level for HDL is 60 or higher.  Triglycerides. These are fats that the body can store or burn for energy. The desired number for triglycerides is lower than 150.  Total cholesterol. This is a measure of the total amount of cholesterol in your blood, including LDL  cholesterol, HDL cholesterol, and triglycerides. A healthy number is less than 200. How is this treated? This condition is treated with diet changes, lifestyle changes, and medicines. Diet changes  This may include eating more whole grains, fruits, vegetables, nuts, and fish.  This may also include cutting back on red meat and foods that have a lot of added sugar. Lifestyle changes  Changes may include getting at least 40 minutes of aerobic exercise 3 times a week. Aerobic exercises include walking, biking, and swimming. Aerobic exercise along with a healthy diet can help you maintain a healthy weight.  Changes may also include quitting smoking. Medicines  Medicines are usually given if diet and lifestyle changes have failed to reduce your cholesterol to healthy levels.  Your health care provider may prescribe a statin medicine. Statin medicines have been shown to reduce cholesterol, which can reduce the risk of heart disease. Follow these instructions at home: Eating and drinking If told by your health care provider:  Eat chicken (without skin), fish, veal, shellfish, ground turkey breast, and round or loin cuts of red meat.  Do not eat fried foods or fatty meats, such as hot dogs and salami.  Eat plenty of fruits, such as apples.  Eat plenty of vegetables, such as broccoli, potatoes, and carrots.  Eat beans, peas, and lentils.  Eat grains such as barley, rice, couscous, and bulgur wheat.  Eat pasta without cream sauces.  Use skim or nonfat milk, and eat low-fat or nonfat yogurt and cheeses.  Do not eat or drink whole milk, cream, ice cream, egg yolks,   or hard cheeses.  Do not eat stick margarine or tub margarines that contain trans fats (also called partially hydrogenated oils).  Do not eat saturated tropical oils, such as coconut oil and palm oil.  Do not eat cakes, cookies, crackers, or other baked goods that contain trans fats.  General instructions  Exercise as  directed by your health care provider. Increase your activity level with activities such as gardening, walking, and taking the stairs.  Take over-the-counter and prescription medicines only as told by your health care provider.  Do not use any products that contain nicotine or tobacco, such as cigarettes and e-cigarettes. If you need help quitting, ask your health care provider.  Keep all follow-up visits as told by your health care provider. This is important. Contact a health care provider if:  You are struggling to maintain a healthy diet or weight.  You need help to start on an exercise program.  You need help to stop smoking. Get help right away if:  You have chest pain.  You have trouble breathing. This information is not intended to replace advice given to you by your health care provider. Make sure you discuss any questions you have with your health care provider. Document Released: 05/25/2005 Document Revised: 05/28/2017 Document Reviewed: 11/23/2015 Elsevier Patient Education  2020 [employer]  Diabetes Mellitus and Standards of Medical Care Managing diabetes (diabetes mellitus) can be complicated. Your diabetes treatment may be managed by a team of health care providers, including:  A physician who specializes in diabetes (endocrinologist).  A nurse practitioner or physician assistant.  Nurses.  A diet and nutrition specialist (registered dietitian).  A certified diabetes educator (CDE).  An exercise specialist.  A pharmacist.  An eye doctor.  A foot specialist (podiatrist).  A dentist.  A primary care provider.  A mental health provider. Your health care providers follow guidelines to help you get the best quality of care. The following schedule is a general guideline for your diabetes management plan. Your health care providers may give you more specific instructions. Physical exams Upon being diagnosed with diabetes mellitus, and each year after  that, your health care provider will ask about your medical and family history. He or she will also do a physical exam. Your exam may include:  Measuring your height, weight, and body mass index (BMI).  Checking your blood pressure. This will be done at every routine medical visit. Your target blood pressure may vary depending on your medical conditions, your age, and other factors.  Thyroid gland exam.  Skin exam.  Screening for damage to your nerves (peripheral neuropathy). This may include checking the pulse in your legs and feet and checking the level of sensation in your hands and feet.  A complete foot exam to inspect the structure and skin of your feet, including checking for cuts, bruises, redness, blisters, sores, or other problems.  Screening for blood vessel (vascular) problems, which may include checking the pulse in your legs and feet and checking your temperature. Blood tests Depending on your treatment plan and your personal needs, you may have the following tests done:  HbA1c (hemoglobin A1c). This test provides information about blood sugar (glucose) control over the previous 2-3 months. It is used to adjust your treatment plan, if needed. This test will be done: ? At least 2 times a year, if you are meeting your treatment goals. ? 4 times a year, if you are not meeting your treatment goals or if treatment goals have changed.  Lipid testing, including total, LDL, and HDL cholesterol and triglyceride levels. ? The goal for LDL is less than 100 mg/dL (5.5 mmol/L). If you are at high risk for complications, the goal is less than 70 mg/dL (3.9 mmol/L). ? The goal for HDL is 40 mg/dL (2.2 mmol/L) or higher for men and 50 mg/dL (2.8 mmol/L) or higher for women. An HDL cholesterol of 60 mg/dL (3.3 mmol/L) or higher gives some protection against heart disease. ? The goal for triglycerides is less than 150 mg/dL (8.3 mmol/L).  Liver function tests.  Kidney function tests.   Thyroid function tests. Dental and eye exams  Visit your dentist two times a year.  If you have type 1 diabetes, your health care provider may recommend an eye exam 3-5 years after you are diagnosed, and then once a year after your first exam. ? For children with type 1 diabetes, a health care provider may recommend an eye exam when your child is age 61 or older and has had diabetes for 3-5 years. After the first exam, your child should get an eye exam once a year.  If you have type 2 diabetes, your health care provider may recommend an eye exam as soon as you are diagnosed, and then once a year after your first exam. Immunizations   The yearly flu (influenza) vaccine is recommended for everyone 6 months or older who has diabetes.  The pneumonia (pneumococcal) vaccine is recommended for everyone 2 years or older who has diabetes. If you are 27 or older, you may get the pneumonia vaccine as a series of two separate shots.  The hepatitis B vaccine is recommended for adults shortly after being diagnosed with diabetes.  Adults and children with diabetes should receive all other vaccines according to age-specific recommendations from the Centers for Disease Control and Prevention (CDC). Mental and emotional health Screening for symptoms of eating disorders, anxiety, and depression is recommended at the time of diagnosis and afterward as needed. If your screening shows that you have symptoms (positive screening result), you may need more evaluation and you may work with a mental health care provider. Treatment plan Your treatment plan will be reviewed at every medical visit. You and your health care provider will discuss:  How you are taking your medicines, including insulin.  Any side effects you are experiencing.  Your blood glucose target goals.  The frequency of your blood glucose monitoring.  Lifestyle habits, such as activity level as well as tobacco, alcohol, and substance use.  Diabetes self-management education Your health care provider will assess how well you are monitoring your blood glucose levels and whether you are taking your insulin correctly. He or she may refer you to:  A certified diabetes educator to manage your diabetes throughout your life, starting at diagnosis.  A registered dietitian who can create or review your personal nutrition plan.  An exercise specialist who can discuss your activity level and exercise plan. Summary  Managing diabetes (diabetes mellitus) can be complicated. Your diabetes treatment may be managed by a team of health care providers.  Your health care providers follow guidelines in order to help you get the best quality of care.  Standards of care including having regular physical exams, blood tests, blood pressure monitoring, immunizations, screening tests, and education about how to manage your diabetes.  Your health care providers may also give you more specific instructions based on your individual health. This information is not intended to replace advice given to you by  your health care provider. Make sure you discuss any questions you have with your health care provider. Document Released: 03/22/2009 Document Revised: 02/11/2018 Document Reviewed: 02/21/2016 Elsevier Patient Education  [place].  Diabetes Mellitus and Nutrition, Adult When you have diabetes (diabetes mellitus), it is very important to have healthy eating habits because your blood sugar (glucose) levels are greatly affected by what you eat and drink. Eating healthy foods in the appropriate amounts, at about the same times every day, can help you:  Control your blood glucose.  Lower your risk of heart disease.  Improve your blood pressure.  Reach or maintain a healthy weight. Every person with diabetes is different, and each person has different needs for a meal plan. Your health care provider may recommend that you work with a diet and  nutrition specialist (dietitian) to make a meal plan that is best for you. Your meal plan may vary depending on factors such as:  The calories you need.  The medicines you take.  Your weight.  Your blood glucose, blood pressure, and cholesterol levels.  Your activity level.  Other health conditions you have, such as heart or kidney disease. How do carbohydrates affect me? Carbohydrates, also called carbs, affect your blood glucose level more than any other type of food. Eating carbs naturally raises the amount of glucose in your blood. Carb counting is a method for keeping track of how many carbs you eat. Counting carbs is important to keep your blood glucose at a healthy level, especially if you use insulin or take certain oral diabetes medicines. It is important to know how many carbs you can safely have in each meal. This is different for every person. Your dietitian can help you calculate how many carbs you should have at each meal and for each snack. Foods that contain carbs include:  Bread, cereal, rice, pasta, and crackers.  Potatoes and corn.  Peas, beans, and lentils.  Milk and yogurt.  Fruit and juice.  Desserts, such as cakes, cookies, ice cream, and candy. How does alcohol affect me? Alcohol can cause a sudden decrease in blood glucose (hypoglycemia), especially if you use insulin or take certain oral diabetes medicines. Hypoglycemia can be a life-threatening condition. Symptoms of hypoglycemia (sleepiness, dizziness, and confusion) are similar to symptoms of having too much alcohol. If your health care provider says that alcohol is safe for you, follow these guidelines:  Limit alcohol intake to no more than 1 drink per day for nonpregnant women and 2 drinks per day for men. One drink equals 12 oz of beer, 5 oz of wine, or 1 oz of hard liquor.  Do not drink on an empty stomach.  Keep yourself hydrated with water, diet soda, or unsweetened iced tea.  Keep in mind  that regular soda, juice, and other mixers may contain a lot of sugar and must be counted as carbs. What are tips for following this plan?  Reading food labels  Start by checking the serving size on the "Nutrition Facts" label of packaged foods and drinks. The amount of calories, carbs, fats, and other nutrients listed on the label is based on one serving of the item. Many items contain more than one serving per package.  Check the total grams (g) of carbs in one serving. You can calculate the number of servings of carbs in one serving by dividing the total carbs by 15. For example, if a food has 30 g of total carbs, it would be equal to  2 servings of carbs.  Check the number of grams (g) of saturated and trans fats in one serving. Choose foods that have low or no amount of these fats.  Check the number of milligrams (mg) of salt (sodium) in one serving. Most people should limit total sodium intake to less than 2,300 mg per day.  Always check the nutrition information of foods labeled as "low-fat" or "nonfat". These foods may be higher in added sugar or refined carbs and should be avoided.  Talk to your dietitian to identify your daily goals for nutrients listed on the label. Shopping  Avoid buying canned, premade, or processed foods. These foods tend to be high in fat, sodium, and added sugar.  Shop around the outside edge of the grocery store. This includes fresh fruits and vegetables, bulk grains, fresh meats, and fresh dairy. Cooking  Use low-heat cooking methods, such as baking, instead of high-heat cooking methods like deep frying.  Cook using healthy oils, such as olive, canola, or sunflower oil.  Avoid cooking with butter, cream, or high-fat meats. Meal planning  Eat meals and snacks regularly, preferably at the same times every day. Avoid going long periods of time without eating.  Eat foods high in fiber, such as fresh fruits, vegetables, beans, and whole grains. Talk to  your dietitian about how many servings of carbs you can eat at each meal.  Eat 4-6 ounces (oz) of lean protein each day, such as lean meat, chicken, fish, eggs, or tofu. One oz of lean protein is equal to: ? 1 oz of meat, chicken, or fish. ? 1 egg. ?  cup of tofu.  Eat some foods each day that contain healthy fats, such as avocado, nuts, seeds, and fish. Lifestyle  Check your blood glucose regularly.  Exercise regularly as told by your health care provider. This may include: ? 150 minutes of moderate-intensity or vigorous-intensity exercise each week. This could be brisk walking, biking, or water aerobics. ? Stretching and doing strength exercises, such as yoga or weightlifting, at least 2 times a week.  Take medicines as told by your health care provider.  Do not use any products that contain nicotine or tobacco, such as cigarettes and e-cigarettes. If you need help quitting, ask your health care provider.  Work with a Veterinary surgeoncounselor or diabetes educator to identify strategies to manage stress and any emotional and social challenges. Questions to ask a health care provider  Do I need to meet with a diabetes educator?  Do I need to meet with a dietitian?  What number can I call if I have questions?  When are the best times to check my blood glucose? Where to find more information:  American Diabetes Association: diabetes.org  Academy of Nutrition and Dietetics: www.eatright.[employer]  National Institute of Diabetes and Digestive and Kidney Diseases (NIH): Car[employer].tnwww.niddk.nih.gov Summary  A healthy meal plan will help you control your blood glucose and maintain a healthy lifestyle.  Working with a diet and nutrition specialist (dietitian) can help you make a meal plan that is best for you.  Keep in mind that carbohydrates (carbs) and alcohol have immediate effects on your blood glucose levels. It is important to count carbs and to use alcohol carefully. This information is not intended to  replace advice given to you by your health care provider. Make sure you discuss any questions you have with your health care provider. Document Released: 02/19/2005 Document Revised: 05/07/2017 Document Reviewed: 06/29/2016 Elsevier Patient Education  2020 [employer]

## 2018-12-22 NOTE — Progress Notes (Signed)
[facility]   Progress Note: General Provider: Mike Gip, FNP  SUBJECTIVE:   Timothy Thomas is a 53 y.o. male who  has a past medical history of Aphasia, COPD (chronic obstructive pulmonary disease) (HCC), Glaucoma, Macular degeneration, Myocardial infarction (HCC), Obesity, Obstructive sleep apnea, Tachycardia-bradycardia syndrome (HCC), Variant angina (HCC), and Vasovagal syncope.. Patient presents today for Hyperlipidemia and Follow-up (6 month follow up ) Hyperlipidemia This is a chronic problem. The current episode started more than 1 month ago. The problem is uncontrolled. Recent lipid tests were reviewed and are high. Exacerbating diseases include obesity. There are no known factors aggravating his hyperlipidemia. Current antihyperlipidemic treatment includes statins. Compliance problems include adherence to exercise and adherence to diet.  Risk factors for coronary artery disease include male sex, obesity, a sedentary lifestyle and dyslipidemia (pre-diabetes).      Review of Systems  Constitutional: Negative.   HENT: Negative.   Eyes: Negative.   Respiratory: Negative.   Cardiovascular: Positive for leg swelling.  Gastrointestinal: Negative.   Genitourinary: Negative.   Musculoskeletal: Negative.   Skin: Negative.   Neurological: Negative.   Psychiatric/Behavioral: Negative.      OBJECTIVE: BP (!) 134/51 (BP Location: Left Arm, Patient Position: Sitting, Cuff Size: Large)   Pulse 68   Temp 98.3 F (36.8 C) (Oral)   Resp (!) 22   Ht 5\' 10"  (1.778 m)   Wt (!) 393 lb (178.3 kg)   SpO2 95%   BMI 56.39 kg/m   Wt Readings from Last 3 Encounters:  12/22/18 (!) 393 lb (178.3 kg)  11/28/18 (!) 399 lb 12.8 oz (181.3 kg)  07/29/18 (!) 393 lb (178.3 kg)     Physical Exam Vitals signs and nursing note reviewed.  Constitutional:      General: He is not in acute distress.    Appearance: Normal appearance.  HENT:    Head: Normocephalic and atraumatic.  Eyes:     Extraocular Movements: Extraocular movements intact.     Conjunctiva/sclera: Conjunctivae normal.     Pupils: Pupils are equal, round, and reactive to light.  Cardiovascular:     Rate and Rhythm: Normal rate and regular rhythm.     Heart sounds: No murmur.  Pulmonary:     Effort: Pulmonary effort is normal.     Breath sounds: Normal breath sounds.  Musculoskeletal: Normal range of motion.     Right lower leg: Edema present.     Left lower leg: Edema present.  Skin:    General: Skin is warm and dry.     Findings: Lesion (excoriation and dry skin noted to bilateral lower extremities. ) present.  Neurological:     Mental Status: He is alert and oriented to person, place, and time.  Psychiatric:        Mood and Affect: Mood normal.        Behavior: Behavior normal.        Thought Content: Thought content normal.        Judgment: Judgment normal.     ASSESSMENT/PLAN: 1. Hyperlipidemia LDL goal <100 - Lipid Panel With LDL/HDL Ratio; Future  2. Diastolic dysfunction - furosemide (LASIX) 20 MG tablet; 1-2 tabs daily for leg swelling  Dispense: 90 tablet; Refill: 1  3. Type 2 diabetes mellitus without complication, without long-term current use of insulin (HCC) - HgB A1c - metFORMIN (GLUCOPHAGE) 500 MG tablet; Take 1 tablet (500 mg total) by mouth 2 (two) times daily with a meal.  Dispense: 180 tablet; Refill: 3 - Comprehensive metabolic panel; Future The patient is asked to make an attempt to improve diet and exercise patterns to aid in medical management of this problem.   4. Morbid obesity ([place]) - TSH; Future The patient is asked to make an attempt to improve diet and exercise patterns to aid in medical management of this problem.  5. OSA (obstructive sleep apnea)  6. Need for Tdap vaccination - Tdap vaccine greater than or equal to 7yo IM  Return in about 3 months (around 03/24/2019) for DM2, hyperlipidemia.    The patient  was given clear instructions to go to ER or return to medical center if symptoms do not improve, worsen or new problems develop. The patient verbalized understanding and agreed with plan of care.   Ms. Doug Sou. Nathaneil Canary, FNP-BC [facility] [address] [place], [place] [address] [phone]

## 2018-12-22 NOTE — Telephone Encounter (Signed)
Called and spoke with patient's wife. Let her know the medication would be sent in and to call her pharmacy if she doesn't hear anything in a few hours.   Nothing further needed at this time.

## 2019-01-11 ENCOUNTER — Encounter: Payer: Self-pay | Admitting: Pulmonary Disease

## 2019-01-11 ENCOUNTER — Other Ambulatory Visit: Payer: Self-pay

## 2019-01-11 ENCOUNTER — Ambulatory Visit (INDEPENDENT_AMBULATORY_CARE_PROVIDER_SITE_OTHER): Payer: Self-pay | Admitting: Pulmonary Disease

## 2019-01-11 DIAGNOSIS — R053 Chronic cough: Secondary | ICD-10-CM

## 2019-01-11 DIAGNOSIS — J449 Chronic obstructive pulmonary disease, unspecified: Secondary | ICD-10-CM

## 2019-01-11 DIAGNOSIS — R05 Cough: Secondary | ICD-10-CM

## 2019-01-11 MED ORDER — BENZONATATE 200 MG PO CAPS: 200.0000 mg | ORAL_CAPSULE | Freq: Two times a day (BID) | ORAL | 0 refills | Status: DC | PRN

## 2019-01-11 MED ORDER — PREDNISONE 5 MG PO TABS: ORAL_TABLET | ORAL | 0 refills | Status: DC

## 2019-01-11 NOTE — Assessment & Plan Note (Signed)
Has been stable since 09/2016, in fact patient reports that he has known about a nodule for many years. We will obtain one last CT in 03/2018 and if stable then will stop further imaging

## 2019-01-11 NOTE — Assessment & Plan Note (Signed)
CPAP seems to be working well on auto settings with average 16 cm Will try to obtain a new large nasal mask   Weight loss encouraged, compliance with goal of at least 4-6 hrs every night is the expectation. Advised against medications with sedative side effects Cautioned against driving when sleepy - understanding that sleepiness will vary on a day to day basis

## 2019-01-11 NOTE — Progress Notes (Signed)
Subjective:    Patient ID: Timothy Thomas, male    DOB: [birth year], 53 y.o.   MRN: [id number]  HPI  53 yo morbidly obese male active smoker followed for chronic cough, PE, lung nodule,and COPD Has OSA on CPAP . He was hospitalized 09/2016 with a pulmonary embolism RV/LV ratio 4.2 and a non-STEMI, Venous duplex was negative. He was discharged on Xarelto but unfortunately developed another segmental PE in the left lower lobe in 03/2017 while on Xarelto.  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 Follow-up    Patient reports that he has a dry cough that started in May. He reports that he has sob after the cough.    He presents for evaluation of chronic cough, he states that this is been ongoing since March 2020, no intermittent fevers, cough is mostly dry sometimes last for 5 to 10 seconds but he has had a vagal response where he almost blacks out and this is scary. He had left chest pain which was attributed to rib pain in 03/2018 and is coughing seems to have aggravated this.  He sometimes feels like his ribs are popping with increased coughing. He denies postnasal drip or obvious reflux symptoms he is avoiding dairy because this makes his reflux worse, he is compliant with omeprazole. He was diagnosed with new onset diabetes last month with an HbA1c of 6.9.  He has been able to cut down his smoking to half pack per day I note phone visit since 11/26/2018 and 12/26/2018 when he was given a prednisone taper  We also reviewed CT chest from 03/2018.  He is very compliant with his CPAP machine and likes the auto settings of 12 to 18 cm. Download was reviewed which shows good compliance and average pressure of 16 cm with minimal leak    Significant tests/ events reviewed  Spirometry12/2018 >>moderate restriction with ratio of 83, FEV1 of 70% and FVC of 65%.  CT Angioof 09/2016 shows right lower  lobe 1.0 cm nodule that is stable on follow-up CT in 03/2017, left lower lobe nodule is calcified and noted to be present in the past  CT chest 05/2017 stale 41mm RLL nodule 03/2018 CT chest- stable right lower lobe nodule 6 x 5 mm groundglass, has been noted since 2018 and has been stable, calcified granuloma left lung  Sleep study/CPAP titration June 2019 severe sleep apnea AHI 99/an hour, SP O2 low 83%, optimal control CPAP 13 cm H2O   Past Medical History:  Diagnosis Date  . Aphasia    Transient - negative head CT and EEG with neurology workup February 2017 [facility])  . COPD (chronic obstructive pulmonary disease) (HCC)   . Glaucoma   . Macular degeneration   . Myocardial infarction (HCC)   . Obesity   . Obstructive sleep apnea   . Tachycardia-bradycardia syndrome (HCC)    Status post pacemaker  . Variant angina (HCC)    History of normal coronary arteries at cardiac catheterization 2002 - vasospasm noted  . Vasovagal syncope      Review of Systems   neg for any significant sore throat, dysphagia, itching, sneezing, nasal congestion or excess/ purulent secretions, fever, chills, sweats, unintended wt loss, pleuritic or exertional cp, hempoptysis, orthopnea pnd or change in chronic leg swelling. Also denies presyncope, palpitations, heartburn, abdominal pain, nausea, vomiting, diarrhea or change in bowel or urinary habits, dysuria,hematuria, rash, arthralgias, visual complaints, headache, numbness  weakness or ataxia.      Objective:   Physical Exam   Gen. Pleasant, obese, in no distress, normal affect ENT - no pallor,icterus, no post nasal drip, class 2-3 airway Neck: No JVD, no thyromegaly, no carotid bruits Lungs: no use of accessory muscles, no dullness to percussion, decreased without rales or rhonchi  Cardiovascular: Rhythm regular, heart sounds  normal, no murmurs or gallops, no peripheral edema Abdomen: soft and non-tender, no hepatosplenomegaly, BS normal.  Musculoskeletal: No deformities, no cyanosis or clubbing Neuro:  alert, non focal, no tremors         Assessment & Plan:

## 2019-01-11 NOTE — Patient Instructions (Addendum)
  CPAP is working well. Trial of 5 mg prednisone daily for 2 weeks then every other day for 2 weeks then stop Okay to take Delsym 5 mL twice daily as needed for cough Benzonatate 200 mg Perles twice daily for 2 weeks and then as needed x60  Try to use nicotine patch to help you quit  CT chest without contrast in end October 2019

## 2019-01-11 NOTE — Assessment & Plan Note (Signed)
Stable, continue current regimen of Pulmicort/albuterol and Atrovent nebs Smoking cessation again paramount and emphasized

## 2019-01-11 NOTE — Assessment & Plan Note (Signed)
Unclear cause, obvious trigger is smoking No obvious postnasal drip or reflux Smoking cessation paramount here  Trial of 5 mg prednisone daily for 2 weeks then every other day for 2 weeks then stop Okay to take Delsym 5 mL twice daily as needed for cough Benzonatate 200 mg Perles twice daily for 2 weeks and then as needed x60  Continue omeprazole and dietary avoidance of daily to prevent reflux

## 2019-01-16 LAB — PROTIME-INR: INR: 2.5 — AB (ref 0.9–1.1)

## 2019-01-18 ENCOUNTER — Telehealth: Payer: Self-pay

## 2019-01-18 NOTE — Telephone Encounter (Signed)
Called and spoke with patient, he had recent labs done at another facility and wants to know if we can review them so he doesn't have to come back for fasting labs that are pending in chart. I advised him to have the other doctor fax [place] a copy and I will give them to NP to review. Thanks!

## 2019-01-24 ENCOUNTER — Telehealth: Payer: Self-pay | Admitting: Pulmonary Disease

## 2019-01-24 ENCOUNTER — Other Ambulatory Visit: Payer: Self-pay | Admitting: Family Medicine

## 2019-01-24 DIAGNOSIS — E785 Hyperlipidemia, unspecified: Secondary | ICD-10-CM

## 2019-01-24 MED ORDER — IPRATROPIUM BROMIDE 0.02 % IN SOLN: 0.5000 mg | Freq: Four times a day (QID) | RESPIRATORY_TRACT | 5 refills | Status: DC

## 2019-01-24 NOTE — Telephone Encounter (Signed)
Refill sent to [facility] as requested and message left on voicemail that refill was sent and to call if anything further is needed.

## 2019-01-30 ENCOUNTER — Ambulatory Visit: Payer: Self-pay | Admitting: Pulmonary Disease

## 2019-02-03 ENCOUNTER — Telehealth: Payer: Self-pay | Admitting: Pulmonary Disease

## 2019-02-03 NOTE — Telephone Encounter (Signed)
Very sorry to hear this. If he is having this significant shortness of breath he will need further evaluation and most likely emergency room care to further evaluate this and rule out any type of possible underlying emergency-please refer to ER or Urgent care as our office is closed.   Patient can continue current regimen from Dr. Elsworth Soho.  Will need an office visit next week with Dr. Elsworth Soho or nurse practitioner to further evaluate and determine if any additional testing or medications are indicated  Please contact office for sooner follow up if symptoms do not improve or worsen or seek emergency care

## 2019-02-03 NOTE — Telephone Encounter (Signed)
Spoke with patient. He is aware of recommendations and verbalized understanding.   Patient has been scheduled for an OV with TP on Monday at 2pm.   Nothing further needed at time of call.

## 2019-02-03 NOTE — Telephone Encounter (Signed)
Patient states he has having moments where he just can't catch his breath and can't breathe.  He won't be doing anything specific and it will just hit him.   At Last OV with RA (01/11/19), he was told to trial prednisone 5 mg every day for 2 weeks, then 5 mg every other day for two weeks then stop. He just finished that trial. Patient states that he was on prednisone 10mg  every day and until 3 months ago when it all changed.   Patient also using pulmicort BID, atrovent 3 times a day (Even though RX is for 4x a day), and albuterol every 4 hours (not prn).   TP please advise on patients breathing

## 2019-02-06 ENCOUNTER — Encounter: Payer: Self-pay | Admitting: Adult Health

## 2019-02-06 ENCOUNTER — Ambulatory Visit (INDEPENDENT_AMBULATORY_CARE_PROVIDER_SITE_OTHER): Payer: Self-pay | Admitting: Adult Health

## 2019-02-06 ENCOUNTER — Ambulatory Visit (INDEPENDENT_AMBULATORY_CARE_PROVIDER_SITE_OTHER)
Admission: RE | Admit: 2019-02-06 | Discharge: 2019-02-06 | Disposition: A | Discharge status: Home / Self Care | Payer: Self-pay | Source: Ambulatory Visit | Attending: Adult Health | Admitting: Adult Health

## 2019-02-06 ENCOUNTER — Other Ambulatory Visit: Payer: Self-pay

## 2019-02-06 VITALS — BP 122/80 | HR 79 | Temp 98.1°F | Ht 70.0 in | Wt 384.2 lb

## 2019-02-06 DIAGNOSIS — J449 Chronic obstructive pulmonary disease, unspecified: Secondary | ICD-10-CM

## 2019-02-06 DIAGNOSIS — G4733 Obstructive sleep apnea (adult) (pediatric): Secondary | ICD-10-CM

## 2019-02-06 DIAGNOSIS — R911 Solitary pulmonary nodule: Secondary | ICD-10-CM

## 2019-02-06 DIAGNOSIS — Z9989 Dependence on other enabling machines and devices: Secondary | ICD-10-CM

## 2019-02-06 NOTE — Assessment & Plan Note (Signed)
Stable on CT chest  follow up CT chest 03/2019

## 2019-02-06 NOTE — Patient Instructions (Addendum)
Work on not smoking .   Chest xray today  Begin Delsym 2 tsp Twice daily  For cough  Tessalon Three times a day  As needed  Cough  Add Chlortrimeton (Chlorpheniramine) 4mg  2 At bedtime  , may use during daytime every 4hr as needed for cough/throat clearing , drainage , tickle in throat (may make you sleepy) .  Continue on Budesonide Neb Twice daily   Continue on albuterol /ipratropium nebulizer 4 times daily. NO MINTS .  Prilosec 20mg  daily  GERD diet .  Continue on CPAP At bedtime  - keep up good work.  Work on healthy weight .  Do not drive if sleepy.  Follow up with Dr. Elsworth Soho  In 6-8 weeks and As needed  and As needed   Please contact office for sooner follow up if symptoms do not improve or worsen or seek emergency care

## 2019-02-06 NOTE — Assessment & Plan Note (Signed)
Excellent control and compliance on CPAP   Plan  Patient Instructions  Work on not smoking .   Chest xray today  Begin Delsym 2 tsp Twice daily  For cough  Tessalon Three times a day  As needed  Cough  Add Chlortrimeton (Chlorpheniramine) 4mg  2 At bedtime  , may use during daytime every 4hr as needed for cough/throat clearing , drainage , tickle in throat (may make you sleepy) .  Continue on Budesonide Neb Twice daily   Continue on albuterol /ipratropium nebulizer 4 times daily. NO MINTS .  Prilosec 20mg  daily  GERD diet .  Continue on CPAP At bedtime  - keep up good work.  Work on healthy weight .  Do not drive if sleepy.  Follow up with Dr. Elsworth Soho  In 6-8 weeks and As needed  and As needed   Please contact office for sooner follow up if symptoms do not improve or worsen or seek emergency care     n

## 2019-02-06 NOTE — Progress Notes (Signed)
 @Patient  ID: Timothy Thomas, male    DOB: 25-Aug-1965, 53 y.o.   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  CT Angioof 09/2016 shows right lower lobe 1.0 cm nodule that is stable on follow-up CT in 03/2017, left lower lobe nodule is calcified and noted to be present in the past  CT chest 05/2017 stale 9mm RLL nodule  Sleep study/CPAP titration June 2019 severe sleep apnea AHI 99/an hour, SP O2 low 83%, optimal control CPAP 13 cm H2O   02/06/2019 Follow up : COPD, PE , Lung nodule , OSA , Smoker , D CHF  Patient returns for a one-month follow-up.  Patient has underlying COPD, chronic cough.  He continues to smoke.  He was seen last visit with a mild COPD flare with upper airway cough.  He was recommended to use Delsym and Tessalon was given a short prednisone burst.  Patient complains of recurrent coughing episodes over the last 9 months.  Says he gets into coughing fits that cause him to have upper airway spasms.  Seem to be worse as the day goes on.  He does have some drainage in his throat nasal stuffiness.  Has been using [employer] Perles with some relief.  He is on budesonide nebulizer twice daily and DuoNeb nebulizer 4 times daily.  Patient uses good Rx to try to get medications at a more affordable rate as he does not have any medical insurance or prescription coverage.  Called pharmacy for the most part is feeling his medications consistently  Patient does have underlying severe sleep  apnea.  Has recently got a new CPAP machine says it is working very well.  He never misses a night.  Feels rested with no significant daytime sleepiness.  CPAP download shows excellent compliance with 100% usage.  Daily average usage at 6.5 hours.  AHI 1.1.  Patient is on CPAP 12 to 18 cm H2O.  CPAP download was requested is working on weight loss.  Is down 15 pounds.  Has diastolic heart failure.  Says lower extremity edema is okay.  Is trying to lose weight as above.  Patient has known lung nodules on CT scan.  Has a planned serial CT chest October 2020.Marland Kitchen.  Allergies  Allergen Reactions   Codeine Nausea And Vomiting    Low tolerance to narcotics   Ketorolac Nausea And Vomiting    Vomiting      Immunization History  Administered Date(s) Administered   Influenza,inj,Quad PF,6+ Mos 02/09/2017   Pneumococcal Polysaccharide-23 10/02/2016   Tdap 12/22/2018    Past Medical History:  Diagnosis Date   Aphasia    Transient - negative head CT and EEG with neurology workup February 2017 [facility]   COPD (chronic obstructive pulmonary disease) (HCC)    Glaucoma    Macular degeneration    Myocardial infarction (HCC)    Obesity    Obstructive sleep apnea    Tachycardia-bradycardia syndrome (HCC)    Status post pacemaker   Variant angina (HCC)    History  of normal coronary arteries at cardiac catheterization 2002 - vasospasm noted   Vasovagal syncope     Tobacco History: Social History   Tobacco Use  Smoking Status Current Every Day Smoker   Packs/day: 1.50   Years: 40.00   Pack years: 60.00   Types: Cigarettes   Start date: 09/26/1976  Smokeless Tobacco Never Used  Tobacco Comment   currently smoking 0.5ppd as of 07/29/2018   Ready to quit: No Counseling given: Yes Comment: currently smoking 0.5ppd as of 07/29/2018   Outpatient Medications Prior to Visit  Medication Sig Dispense Refill   albuterol (PROVENTIL HFA;VENTOLIN HFA) 108 (90 Base) MCG/ACT  inhaler Inhale 2 puffs into the lungs every 6 (six) hours as needed for wheezing or shortness of breath. 1 Inhaler 5   albuterol (PROVENTIL) (2.5 MG/3ML) 0.083% nebulizer solution Take 3 mLs (2.5 mg total) by nebulization every 6 (six) hours as needed for wheezing or shortness of breath. DX: J44.9 360 mL 6   aspirin EC 81 MG tablet Take 81 mg by mouth daily.     atorvastatin (LIPITOR) 40 MG tablet TAKE ONE TABLET BY MOUTH DAILY AT SIX PM 90 tablet 0   benzonatate (TESSALON) 200 MG capsule Take 1 capsule (200 mg total) by mouth 2 (two) times daily as needed for cough. 60 capsule 0   budesonide (PULMICORT) 0.5 MG/2ML nebulizer solution Take 2 mLs (0.5 mg total) by nebulization 2 (two) times daily. 120 mL 12   diphenhydramine-acetaminophen (TYLENOL PM) 25-500 MG TABS tablet Take 3 tablets by mouth at bedtime.      Docusate Sodium (COLACE PO) Take by mouth.     furosemide (LASIX) 20 MG tablet 1-2 tabs daily for leg swelling 90 tablet 1   ipratropium (ATROVENT) 0.02 % nebulizer solution Take 2.5 mLs (0.5 mg total) by nebulization 4 (four) times daily. DX: J44.9 225 mL 5   metFORMIN (GLUCOPHAGE) 500 MG tablet Take 1 tablet (500 mg total) by mouth 2 (two) times daily with a meal. 180 tablet 3   omeprazole (PRILOSEC) 20 MG capsule Take 1 capsule (20 mg total) by mouth daily. 30 capsule 4   predniSONE (DELTASONE) 5 MG tablet 1 tab daily for 2 weeks, then 1 tab every other day for 2 weeks, then stop 21 tablet 0   warfarin (COUMADIN) 5 MG tablet Take 7.5 mg by mouth daily.      No facility-administered medications prior to visit.      Review of Systems:   Constitutional:   No  weight loss, night sweats,  Fevers, chills,  +fatigue, or  lassitude.  HEENT:   No headaches,  Difficulty swallowing,  Tooth/dental problems, or  Sore throat,                No sneezing, itching, ear ache, nasal congestion, post nasal drip,   CV:  No chest pain,  Orthopnea, PND, swelling in lower extremities,  anasarca, dizziness, palpitations, syncope.   GI  No heartburn, indigestion, abdominal pain, nausea, vomiting, diarrhea, change in bowel habits, loss of appetite, bloody stools.   Resp:  No chest wall deformity  Skin: no rash or lesions.  GU: no dysuria, change in color of urine, no urgency or frequency.  No flank pain, no hematuria   MS:  No joint pain or swelling.  No decreased range of motion.  No back pain.    Physical Exam  BP 122/80 (BP Location: Left Arm, Cuff Size: Large)    Pulse 79    Temp  98.1 F (36.7 C) (Oral)    Ht 5\' 10"  (1.778 m)    Wt (!) 384 lb 3.2 oz (174.3 kg)    SpO2 93%    BMI 55.13 kg/m   GEN: A/Ox3; pleasant , NAD, obese    HEENT:  [place]/AT,   NOSE-clear, THROAT-clear, no lesions, no postnasal drip or exudate noted.  Very poor dentition , class 3 MP airway   NECK:  Supple w/ fair ROM; no JVD; normal carotid impulses w/o bruits; no thyromegaly or nodules palpated; no lymphadenopathy.    RESP  Clear  P & A; w/o, wheezes/ rales/ or rhonchi. no accessory muscle use, no dullness to percussion  CARD:  RRR, no m/r/g, 1+peripheral edema, pulses intact, no cyanosis or clubbing.  GI:   Soft & nt; nml bowel sounds; no organomegaly or masses detected.   Musco: Warm bil, no deformities or joint swelling noted.   Neuro: alert, no focal deficits noted.    Skin: Warm, no lesions or rashes    Lab Results:  CBC  BMET  BNP   Imaging: No results found.    No flowsheet data found.  No results found for: NITRICOXIDE      Assessment & Plan:   Chronic obstructive pulmonary disease (HCC) COPD with ongoing symptoms with upper airway cough.  Has not had significant improvement with multiple courses of steroids.  Patient has underlying diabetes would like to refrain from using frequent steroids if possible. Check chest x-ray today. Treat for probable triggers for upper airway cough including chronic rhinitis and GERD. Continue on cough suppression regimen  with Delsym and Tessalon we will add in Chlor-Trimeton for possible post nasal drainage component  Plan  Patient Instructions  Work on not smoking .   Chest xray today  Begin Delsym 2 tsp Twice daily  For cough  Tessalon Three times a day  As needed  Cough  Add Chlortrimeton (Chlorpheniramine) 4mg  2 At bedtime  , may use during daytime every 4hr as needed for cough/throat clearing , drainage , tickle in throat (may make you sleepy) .  Continue on Budesonide Neb Twice daily   Continue on albuterol /ipratropium nebulizer 4 times daily. NO MINTS .  Prilosec 20mg  daily  GERD diet .  Continue on CPAP At bedtime  - keep up good work.  Work on healthy weight .  Do not drive if sleepy.  Follow up with Dr. Vassie Loll  In 6-8 weeks and As needed  and As needed   Please contact office for sooner follow up if symptoms do not improve or worsen or seek emergency care        OSA on CPAP Excellent control and compliance on CPAP   Plan  Patient Instructions  Work on not smoking .   Chest xray today  Begin Delsym 2 tsp Twice daily  For cough  Tessalon Three times a day  As needed  Cough  Add Chlortrimeton (Chlorpheniramine) 4mg  2 At bedtime  , may use during daytime every 4hr as needed for cough/throat clearing , drainage , tickle in throat (may make you sleepy) .  Continue on Budesonide Neb Twice daily   Continue on albuterol /ipratropium nebulizer 4 times daily. NO MINTS .  Prilosec 20mg  daily  GERD diet .  Continue on CPAP At bedtime  - keep up good work.  Work on healthy weight .  Do not drive if sleepy.  Follow up with Dr. Vassie Loll  In 6-8 weeks and As needed  and  As needed   Please contact office for sooner follow up if symptoms do not improve or worsen or seek emergency care     n   Pulmonary nodule Stable on CT chest  follow up CT chest 03/2019      Rubye Oaksammy Jymir Dunaj, NP 02/06/2019

## 2019-02-06 NOTE — Assessment & Plan Note (Signed)
COPD with ongoing symptoms with upper airway cough.  Has not had significant improvement with multiple courses of steroids.  Patient has underlying diabetes would like to refrain from using frequent steroids if possible. Check chest x-ray today. Treat for probable triggers for upper airway cough including chronic rhinitis and GERD. Continue on cough suppression regimen with Delsym and Tessalon we will add in Chlor-Trimeton for possible post nasal drainage component  Plan  Patient Instructions  Work on not smoking .   Chest xray today  Begin Delsym 2 tsp Twice daily  For cough  Tessalon Three times a day  As needed  Cough  Add Chlortrimeton (Chlorpheniramine) 4mg  2 At bedtime  , may use during daytime every 4hr as needed for cough/throat clearing , drainage , tickle in throat (may make you sleepy) .  Continue on Budesonide Neb Twice daily   Continue on albuterol /ipratropium nebulizer 4 times daily. NO MINTS .  Prilosec 20mg  daily  GERD diet .  Continue on CPAP At bedtime  - keep up good work.  Work on healthy weight .  Do not drive if sleepy.  Follow up with Dr. Elsworth Soho  In 6-8 weeks and As needed  and As needed   Please contact office for sooner follow up if symptoms do not improve or worsen or seek emergency care

## 2019-02-14 LAB — PROTIME-INR

## 2019-02-15 ENCOUNTER — Encounter (HOSPITAL_COMMUNITY): Payer: Self-pay

## 2019-02-15 ENCOUNTER — Encounter (HOSPITAL_COMMUNITY): Payer: Self-pay | Admitting: *Deleted

## 2019-02-17 ENCOUNTER — Other Ambulatory Visit: Payer: Self-pay | Admitting: Pulmonary Disease

## 2019-02-17 ENCOUNTER — Telehealth: Payer: Self-pay | Admitting: Pulmonary Disease

## 2019-02-17 ENCOUNTER — Other Ambulatory Visit: Payer: Self-pay | Admitting: Family Medicine

## 2019-02-17 DIAGNOSIS — E785 Hyperlipidemia, unspecified: Secondary | ICD-10-CM

## 2019-02-17 MED ORDER — ALBUTEROL SULFATE HFA 108 (90 BASE) MCG/ACT IN AERS: 2.0000 | INHALATION_SPRAY | Freq: Four times a day (QID) | RESPIRATORY_TRACT | 2 refills | Status: DC | PRN

## 2019-02-17 NOTE — Telephone Encounter (Signed)
Appropriate for refill

## 2019-02-17 NOTE — Telephone Encounter (Signed)
Spoke with pt and advised rx for albuterol was sent to the pharmacy. Nothing further is needed.

## 2019-02-17 NOTE — Telephone Encounter (Signed)
Saw last by TP please route to her.   Wyn Quaker FNP

## 2019-03-15 ENCOUNTER — Telehealth: Payer: Self-pay

## 2019-03-15 NOTE — Telephone Encounter (Signed)
Received fax labs from [facility] in [place]. They are being scanned in chart. There is a copy in rx pick up box if needed before they are scanned. Thanks!

## 2019-03-16 ENCOUNTER — Telehealth: Payer: Self-pay | Admitting: Pulmonary Disease

## 2019-03-16 NOTE — Telephone Encounter (Signed)
Called and spoke to pt. Pt c/o increase in SOB, dry cough, and chest tightness x 3 days. Pt denies chest congestion, body aches, f/c/s, swelling. Appt made with Derl Barrow, NP, video visit for 10/9. Pt verbalized understanding and denied any further questions or concerns at this time.

## 2019-03-17 ENCOUNTER — Telehealth (INDEPENDENT_AMBULATORY_CARE_PROVIDER_SITE_OTHER): Payer: Self-pay | Admitting: Primary Care

## 2019-03-17 ENCOUNTER — Telehealth: Payer: Self-pay | Admitting: Nurse Practitioner

## 2019-03-17 ENCOUNTER — Telehealth: Payer: Self-pay | Admitting: Primary Care

## 2019-03-17 ENCOUNTER — Encounter: Payer: Self-pay | Admitting: Primary Care

## 2019-03-17 DIAGNOSIS — R059 Cough, unspecified: Secondary | ICD-10-CM

## 2019-03-17 DIAGNOSIS — Z20822 Contact with and (suspected) exposure to covid-19: Secondary | ICD-10-CM

## 2019-03-17 DIAGNOSIS — J441 Chronic obstructive pulmonary disease with (acute) exacerbation: Secondary | ICD-10-CM

## 2019-03-17 DIAGNOSIS — R05 Cough: Secondary | ICD-10-CM

## 2019-03-17 MED ORDER — PREDNISONE 10 MG PO TABS: ORAL_TABLET | ORAL | 0 refills | Status: DC

## 2019-03-17 MED ORDER — BENZONATATE 200 MG PO CAPS: 200.0000 mg | ORAL_CAPSULE | Freq: Two times a day (BID) | ORAL | 0 refills | Status: DC | PRN

## 2019-03-17 MED ORDER — BUDESONIDE 0.5 MG/2ML IN SUSP: 0.5000 mg | Freq: Two times a day (BID) | RESPIRATORY_TRACT | 12 refills | Status: DC

## 2019-03-17 MED ORDER — IPRATROPIUM BROMIDE 0.02 % IN SOLN: 0.5000 mg | Freq: Four times a day (QID) | RESPIRATORY_TRACT | 5 refills | Status: DC

## 2019-03-17 MED ORDER — PROMETHAZINE-DM 6.25-15 MG/5ML PO SYRP: 5.0000 mL | ORAL_SOLUTION | Freq: Four times a day (QID) | ORAL | 0 refills | Status: AC | PRN

## 2019-03-17 NOTE — Telephone Encounter (Signed)
Call returned to patient, he reports he received a message on mychart from a NP by the name of Reynolds Bowl asking that he complete a questionaire daily for the next 5 days. I made him aware we do not have anyone by that name here in the clinic but I can ask Eustaquio Maize if she made any referrals or anything. I do not see any referrals having been made per the documentation.   Beth please advise, I can see the mychart message in epic. Is this something we are doing for patients with symptoms similar to covid? Thanks.

## 2019-03-17 NOTE — Telephone Encounter (Signed)
Call made to patient, made aware of Beth's recommendations. He inquired as to whether we sent in some cough medicine. I made him aware we did not, it appears NP Leath with family medicine did. He states he will try and give his PCP a call and see who may have sent in the medication for him. I made him aware to call us back if he had any questions and not to pickup the medication until he was sure who sent it and if he needs the medication. Voiced understanding. Nothing further needed at this time.

## 2019-03-17 NOTE — Telephone Encounter (Signed)
I did not place a referral. But it looks like she is with family medicine and contacted him about suspected COVID. My guess is they may be tracking people with acute respiratory symptoms and asking them to monitor their symptoms. I had low suspicion for covid but I do not think there is harm in completing questionnaire

## 2019-03-17 NOTE — Progress Notes (Signed)
E-Visit for Corona Virus Screening   Your current symptoms could be consistent with the coronavirus.  Many health care providers can now test patients at their office but not all are.  [facility] has multiple testing sites. For information on our COVID testing locations and hours go to [email or web address]  Please quarantine yourself while awaiting your test results.  We are enrolling you in our [place] for [place] . Daily you will receive a questionnaire within the [facility] website. Our COVID 19 response team willl be monitoriing your responses daily.    COVID-19 is a respiratory illness with symptoms that are similar to the flu. Symptoms are typically mild to moderate, but there have been cases of severe illness and death due to the virus. The following symptoms may appear 2-14 days after exposure: . Fever . Cough . Shortness of breath or difficulty breathing . Chills . Repeated shaking with chills . Muscle pain . Headache . Sore throat . New loss of taste or smell . Fatigue . Congestion or runny nose . Nausea or vomiting . Diarrhea  It is vitally important that if you feel that you have an infection such as this virus or any other virus that you stay home and away from places where you may spread it to others.  You should self-quarantine for 14 days if you have symptoms that could potentially be coronavirus or have been in close contact a with a person diagnosed with COVID-19 within the last 2 weeks. You should avoid contact with people age 40 and older.  Due to your underlying history, if your symptoms do not improve, you will need to follow up with your PCP as soon as possible.  You should wear a mask or cloth face covering over your nose and mouth if you must be around other people or animals, including pets (even at home). Try to stay at least 6 feet away from other people. This will protect the people around you.  You can use medication  such as A prescription cough medication called Phenergan DM 6.25 mg/15 mg. You make take one teaspoon / 5 ml every 4-6 hours as needed for cough  You may also take acetaminophen (Tylenol) as needed for fever.   Reduce your risk of any infection by using the same precautions used for avoiding the common cold or flu:  Marland Kitchen Wash your hands often with soap and warm water for at least 20 seconds.  If soap and water are not readily available, use an alcohol-based hand sanitizer with at least 60% alcohol.  . If coughing or sneezing, cover your mouth and nose by coughing or sneezing into the elbow areas of your shirt or coat, into a tissue or into your sleeve (not your hands). . Avoid shaking hands with others and consider head nods or verbal greetings only. . Avoid touching your eyes, nose, or mouth with unwashed hands.  . Avoid close contact with people who are sick. . Avoid places or events with large numbers of people in one location, like concerts or sporting events. . Carefully consider travel plans you have or are making. . If you are planning any travel outside or inside the [place], visit the CDC's Travelers' Health webpage for the latest health notices. . If you have some symptoms but not all symptoms, continue to monitor at home and seek medical attention if your symptoms worsen. . If you are having a medical emergency, call 911.  HOME CARE . Only take medications as instructed  by your medical team. . Drink plenty of fluids and get plenty of rest. . A steam or ultrasonic humidifier can help if you have congestion.   GET HELP RIGHT AWAY IF YOU HAVE EMERGENCY WARNING SIGNS** FOR COVID-19. If you or someone is showing any of these signs seek emergency medical care immediately. Call 911 or proceed to your closest emergency facility if: . You develop worsening high fever. . Trouble breathing . Bluish lips or face . Persistent pain or pressure in the chest . New confusion . Inability to wake or stay  awake . You cough up blood. . Your symptoms become more severe  **This list is not all possible symptoms. Contact your medical provider for any symptoms that are sever or concerning to you.   MAKE SURE YOU   Understand these instructions.  Will watch your condition.  Will get help right away if you are not doing well or get worse.  Your e-visit answers were reviewed by a board certified advanced clinical practitioner to complete your personal care plan.  Depending on the condition, your plan could have included both over the counter or prescription medications.  If there is a problem please reply once you have received a response from your provider.  Your safety is important to [place].  If you have drug allergies check your prescription carefully.    You can use MyChart to ask questions about today's visit, request a non-urgent call back, or ask for a work or school excuse for 24 hours related to this e-Visit. If it has been greater than 24 hours you will need to follow up with your provider, or enter a new e-Visit to address those concerns. You will get an e-mail in the next two days asking about your experience.  I hope that your e-visit has been valuable and will speed your recovery. Thank you for using e-visits.   I have spent more than 5 minutes reviewing and documenting in the patient's chart.

## 2019-03-17 NOTE — Patient Instructions (Addendum)
Pleasure speaking with you today, im sorry you are not feeling well Treating you for suspected COPD exacerbation, no signs of bacterial bronchitis  Rx: Prednisone taper (40mg  x 3 days; 30mg  x 3 days; 20mg  x 3 days; 10mg  x 3 days)  Recommendations: Continue Pulmicort twice daily and Atrovent four times daily  Use Albuterol every 6 hours for breakthrough shortness of breath  Smoking cessation strongly encouraged!!!  Follow-up 1-2 months with Dr. Elsworth Soho

## 2019-03-17 NOTE — Telephone Encounter (Signed)
Call returned to patient, wife on speaker phone. They report they wanted to know whether we sent in some cough medicine. I made them aware we did not and we just spoke about this previously. The husband states he thought he was calling another office. Nothing further needed at this time.

## 2019-03-17 NOTE — Progress Notes (Signed)
Virtual Visit via Video Note  I connected with Timothy Thomas on 03/17/19 at 11:30 AM EDT by a video enabled telemedicine application and verified that I am speaking with the correct person using two identifiers.  Location: Patient: Home Provider: Office   I discussed the limitations of evaluation and management by telemedicine and the availability of in person appointments. The patient expressed understanding and agreed to proceed.  History of Present Illness: 53 year old male, current smoker. PMH significant for OSA on CPAP, COPD, chronic cough, pulmonary embolism, pulmonary nodule, tobacco abuse, obesity. Patient of Dr. Elsworth Soho, last seen by pulmonary nurse practitioner on 02/06/19.    03/17/2019 Patient contacted today for acute televisit. Complains of chest tightness and wheezing for the last four days. Using Pulmicort and Ipratropium as directed. Using Albuterol nebulizer ever 6 hours and rescue inhaler 4 times a day. Breathing last week was fine. Experiences exacerbations in his COPD symptoms every 2-3 months. No change in cough, non-productive. Afebrile.    Observations/Objective:  - No shortness of breath, wheezing or cough noted during video visit  Significant tests/ events reviewed  Spirometry12/2018 >>moderate restriction with ratio of 83, FEV1 of 70% and FVC of 65%.  CT Angioof 09/2016 shows right lower lobe 1.0 cm nodule that is stable on follow-up CT in 03/2017, left lower lobe nodule is calcified and noted to be present in the past  CT chest 05/2017 stale 70mm RLL nodule 03/2018 CT chest-stable right lower lobe nodule 6 x 5 mm groundglass, has been noted since 2018 and has been stable, calcified granuloma left lung  Sleep study/CPAP titration June 2019 severe sleep apnea AHI 99/an hour, SP O2 low 83%, optimal control CPAP 13 cm H2O   Assessment and Plan:  COPD exacerbation - Increased chest tightness and wheezing x 4 days - No signs of acute bronchitis - RX  prednisone taper (40mg  x 3 days; 30mg  x 3 days; 20mg  x 3 days; 10mg  x 3 days) - Continue Pulmicort BID and Atrovent nebulizer QID - Smoking cessation encouraged  Follow Up Instructions:   - 1-2 month follow-up with Dr. Elsworth Soho  I discussed the assessment and treatment plan with the patient. The patient was provided an opportunity to ask questions and all were answered. The patient agreed with the plan and demonstrated an understanding of the instructions.   The patient was advised to call back or seek an in-person evaluation if the symptoms worsen or if the condition fails to improve as anticipated.  I provided 15 minutes of non-face-to-face time during this encounter.   Martyn Ehrich, NP

## 2019-03-18 ENCOUNTER — Encounter (INDEPENDENT_AMBULATORY_CARE_PROVIDER_SITE_OTHER): Payer: Self-pay

## 2019-03-20 ENCOUNTER — Encounter (INDEPENDENT_AMBULATORY_CARE_PROVIDER_SITE_OTHER): Payer: Self-pay

## 2019-03-21 ENCOUNTER — Encounter (INDEPENDENT_AMBULATORY_CARE_PROVIDER_SITE_OTHER): Payer: Self-pay

## 2019-03-22 ENCOUNTER — Other Ambulatory Visit: Payer: Self-pay

## 2019-03-22 ENCOUNTER — Ambulatory Visit (HOSPITAL_COMMUNITY)
Admission: RE | Admit: 2019-03-22 | Discharge: 2019-03-22 | Disposition: A | Discharge status: Home / Self Care | Payer: Self-pay | Source: Ambulatory Visit | Attending: Pulmonary Disease | Admitting: Pulmonary Disease

## 2019-03-22 DIAGNOSIS — R911 Solitary pulmonary nodule: Secondary | ICD-10-CM | POA: Insufficient documentation

## 2019-03-28 ENCOUNTER — Other Ambulatory Visit: Payer: Self-pay | Admitting: Pulmonary Disease

## 2019-03-28 DIAGNOSIS — R053 Chronic cough: Secondary | ICD-10-CM

## 2019-03-28 DIAGNOSIS — R05 Cough: Secondary | ICD-10-CM

## 2019-03-29 ENCOUNTER — Telehealth: Payer: Self-pay | Admitting: Family Medicine

## 2019-03-30 NOTE — Telephone Encounter (Signed)
Patient will have hematology to fax over lab results. He was concerned about his cholesterol.

## 2019-03-31 ENCOUNTER — Other Ambulatory Visit: Payer: Self-pay | Admitting: Nurse Practitioner

## 2019-03-31 ENCOUNTER — Ambulatory Visit: Payer: Self-pay | Admitting: Family Medicine

## 2019-03-31 DIAGNOSIS — R05 Cough: Secondary | ICD-10-CM

## 2019-03-31 DIAGNOSIS — R059 Cough, unspecified: Secondary | ICD-10-CM

## 2019-03-31 DIAGNOSIS — Z20822 Contact with and (suspected) exposure to covid-19: Secondary | ICD-10-CM

## 2019-04-12 ENCOUNTER — Ambulatory Visit (INDEPENDENT_AMBULATORY_CARE_PROVIDER_SITE_OTHER): Payer: Self-pay | Admitting: Family Medicine

## 2019-04-12 ENCOUNTER — Other Ambulatory Visit: Payer: Self-pay | Admitting: Family Medicine

## 2019-04-12 ENCOUNTER — Encounter: Payer: Self-pay | Admitting: Family Medicine

## 2019-04-12 ENCOUNTER — Other Ambulatory Visit: Payer: Self-pay

## 2019-04-12 VITALS — BP 124/65 | HR 64 | Temp 97.4°F | Resp 22 | Ht 70.0 in | Wt 372.0 lb

## 2019-04-12 DIAGNOSIS — Z09 Encounter for follow-up examination after completed treatment for conditions other than malignant neoplasm: Secondary | ICD-10-CM

## 2019-04-12 DIAGNOSIS — R8290 Unspecified abnormal findings in urine: Secondary | ICD-10-CM

## 2019-04-12 DIAGNOSIS — N390 Urinary tract infection, site not specified: Secondary | ICD-10-CM

## 2019-04-12 DIAGNOSIS — R634 Abnormal weight loss: Secondary | ICD-10-CM

## 2019-04-12 DIAGNOSIS — R0602 Shortness of breath: Secondary | ICD-10-CM

## 2019-04-12 DIAGNOSIS — E785 Hyperlipidemia, unspecified: Secondary | ICD-10-CM

## 2019-04-12 DIAGNOSIS — Z6841 Body Mass Index (BMI) 40.0 and over, adult: Secondary | ICD-10-CM

## 2019-04-12 DIAGNOSIS — J449 Chronic obstructive pulmonary disease, unspecified: Secondary | ICD-10-CM

## 2019-04-12 DIAGNOSIS — E66813 Obesity, class 3: Secondary | ICD-10-CM

## 2019-04-12 DIAGNOSIS — Z86711 Personal history of pulmonary embolism: Secondary | ICD-10-CM

## 2019-04-12 DIAGNOSIS — E119 Type 2 diabetes mellitus without complications: Secondary | ICD-10-CM

## 2019-04-12 DIAGNOSIS — Z9989 Dependence on other enabling machines and devices: Secondary | ICD-10-CM

## 2019-04-12 DIAGNOSIS — G4733 Obstructive sleep apnea (adult) (pediatric): Secondary | ICD-10-CM

## 2019-04-12 DIAGNOSIS — H3530 Unspecified macular degeneration: Secondary | ICD-10-CM

## 2019-04-12 LAB — POCT URINALYSIS DIPSTICK
Bilirubin, UA: NEGATIVE
Glucose, UA: NEGATIVE
Ketones, UA: NEGATIVE
Nitrite, UA: NEGATIVE
Protein, UA: NEGATIVE
Spec Grav, UA: 1.025 (ref 1.010–1.025)
Urobilinogen, UA: 0.2 E.U./dL
pH, UA: 5.5 (ref 5.0–8.0)

## 2019-04-12 LAB — POCT GLYCOSYLATED HEMOGLOBIN (HGB A1C): Hemoglobin A1C: 6.1 % — AB (ref 4.0–5.6)

## 2019-04-12 LAB — GLUCOSE, POCT (MANUAL RESULT ENTRY): POC Glucose: 95 mg/dl (ref 70–99)

## 2019-04-12 MED ORDER — SULFAMETHOXAZOLE-TRIMETHOPRIM 800-160 MG PO TABS: 1.0000 | ORAL_TABLET | Freq: Two times a day (BID) | ORAL | 0 refills | Status: DC

## 2019-04-12 MED ORDER — NITROFURANTOIN MONOHYD MACRO 100 MG PO CAPS: 100.0000 mg | ORAL_CAPSULE | Freq: Two times a day (BID) | ORAL | 0 refills | Status: AC

## 2019-04-12 NOTE — Patient Instructions (Signed)

## 2019-04-12 NOTE — Progress Notes (Signed)
[facility]  Established Patient Office Visit  Subjective:  Patient ID: Timothy Thomas, male    DOB: 1965/10/04  Age: 53 y.o. MRN: [id number]  CC:  Chief Complaint  Patient presents with  . Follow-up    Former Debby Bud NP PT-COPD, DM    HPI Timothy Thomas is a 53 year male who presents for Follow Up today.    Past Medical History:  Diagnosis Date  . Aphasia    Transient - negative head CT and EEG with neurology workup February 2017 [facility])  . COPD (chronic obstructive pulmonary disease) (HCC)   . Glaucoma   . Macular degeneration   . Myocardial infarction (HCC)   . Obesity   . Obstructive sleep apnea   . Tachycardia-bradycardia syndrome (HCC)    Status post pacemaker  . Variant angina (HCC)    History of normal coronary arteries at cardiac catheterization 2002 - vasospasm noted  . Vasovagal syncope    Current Status: This will be his initial office visit with me. He was previously seeing Mike Gip, NP for his PCP needs. Since his last office visit, he is doing well with no complaints. He continues to follow up with Pulmonology as needed. He denies fevers, chills, fatigue, recent infections, weight loss, and night sweats. He has not had any headaches, visual changes, dizziness, and falls. No chest pain, heart palpitations, cough and shortness of breath reported. No reports of GI problems such as nausea, vomiting, diarrhea, and constipation. He has no reports of blood in stools, dysuria and hematuria. No depression or anxiety, and denies suicidal ideations, homicidal ideations, or auditory hallucinations. He denies pain today.   Past Surgical History:  Procedure Laterality Date  . LEFT HEART CATH AND CORONARY ANGIOGRAPHY N/A 09/28/2016   Procedure: Left Heart Cath and Coronary Angiography;  Surgeon: Orpah Cobb, MD;  Location: [facility] INVASIVE CV LAB;  Service: Cardiovascular;  Laterality: N/A;  . PACEMAKER INSERTION       Family History  Problem Relation Age of Onset  . Hypertension Mother   . Diabetes Father   . Hypertension Father   . Stroke Father     Social History   Socioeconomic History  . Marital status: Married    Spouse name: Not on file  . Number of children: Not on file  . Years of education: Not on file  . Highest education level: Not on file  Occupational History  . Not on file  Social Needs  . Financial resource strain: Not on file  . Food insecurity    Worry: Not on file    Inability: Not on file  . Transportation needs    Medical: Not on file    Non-medical: Not on file  Tobacco Use  . Smoking status: Current Every Day Smoker    Packs/day: 1.50    Years: 40.00    Pack years: 60.00    Types: Cigarettes    Start date: 09/26/1976  . Smokeless tobacco: Never Used  . Tobacco comment: currently smoking 0.5ppd as of 07/29/2018  Substance and Sexual Activity  . Alcohol use: No  . Drug use: No  . Sexual activity: Not on file  Lifestyle  . Physical activity    Days per week: Not on file    Minutes per session: Not on file  . Stress: Not on file  Relationships  . Social Musician on phone: Not on file    Gets together: Not  on file    Attends religious service: Not on file    Active member of club or organization: Not on file    Attends meetings of clubs or organizations: Not on file    Relationship status: Not on file  . Intimate partner violence    Fear of current or ex partner: Not on file    Emotionally abused: Not on file    Physically abused: Not on file    Forced sexual activity: Not on file  Other Topics Concern  . Not on file  Social History Narrative  . Not on file    Outpatient Medications Prior to Visit  Medication Sig Dispense Refill  . albuterol (PROVENTIL) (2.5 MG/3ML) 0.083% nebulizer solution Take 3 mLs (2.5 mg total) by nebulization every 6 (six) hours as needed for wheezing or shortness of breath. DX: J44.9 360 mL 6  . albuterol  (VENTOLIN HFA) 108 (90 Base) MCG/ACT inhaler Inhale 2 puffs into the lungs every 6 (six) hours as needed for wheezing or shortness of breath. 18 g 2  . aspirin EC 81 MG tablet Take 81 mg by mouth daily.    Marland Kitchen. atorvastatin (LIPITOR) 40 MG tablet TAKE ONE TABLET BY MOUTH EVERY EVENING AT 6PM 90 tablet 0  . benzonatate (TESSALON) 200 MG capsule Take 1 capsule (200 mg total) by mouth 2 (two) times daily as needed for cough. 60 capsule 0  . budesonide (PULMICORT) 0.5 MG/2ML nebulizer solution Take 2 mLs (0.5 mg total) by nebulization 2 (two) times daily. 120 mL 12  . diphenhydramine-acetaminophen (TYLENOL PM) 25-500 MG TABS tablet Take 3 tablets by mouth at bedtime.     Tery Sanfilippo. Docusate Sodium (COLACE PO) Take by mouth.    . furosemide (LASIX) 20 MG tablet 1-2 tabs daily for leg swelling 90 tablet 1  . ipratropium (ATROVENT) 0.02 % nebulizer solution Take 2.5 mLs (0.5 mg total) by nebulization 4 (four) times daily. DX: J44.9 225 mL 5  . metFORMIN (GLUCOPHAGE) 500 MG tablet Take 1 tablet (500 mg total) by mouth 2 (two) times daily with a meal. 180 tablet 3  . omeprazole (PRILOSEC) 20 MG capsule TAKE ONE CAPSULE BY MOUTH DAILY 30 capsule 3  . warfarin (COUMADIN) 5 MG tablet Take 7 mg by mouth daily.     . predniSONE (DELTASONE) 10 MG tablet Take 4 tabs po daily x 3 days; then 3 tabs daily x3 days; then 2 tabs daily x3 days; then 1 tab daily x 3 days; then stop 30 tablet 0   No facility-administered medications prior to visit.     Allergies  Allergen Reactions  . Codeine Nausea And Vomiting    Low tolerance to narcotics  . Ketorolac Nausea And Vomiting    Vomiting      ROS Review of Systems  Constitutional: Negative.   HENT: Negative.   Eyes: Positive for visual disturbance (macular degeneration).  Respiratory: Positive for cough (Occasional ) and shortness of breath (occasional ).   Cardiovascular: Negative.   Gastrointestinal: Positive for abdominal distention.  Endocrine: Negative.    Genitourinary: Negative.   Musculoskeletal: Negative.   Skin: Negative.   Allergic/Immunologic: Negative.   Neurological: Negative.   Hematological: Negative.   Psychiatric/Behavioral: Negative.       Objective:    Physical Exam  Constitutional: He is oriented to person, place, and time. He appears well-developed and well-nourished.  HENT:  Head: Normocephalic and atraumatic.  Eyes: Conjunctivae are normal.  Neck: Normal range of motion. Neck supple.  Cardiovascular: Normal rate and intact distal pulses.  Pacemaker  Pulmonary/Chest: Effort normal and breath sounds normal.  Abdominal: Soft. Bowel sounds are normal.  Musculoskeletal: Normal range of motion.  Neurological: He is alert and oriented to person, place, and time. He has normal reflexes.  Skin: Skin is warm and dry.  Psychiatric: He has a normal mood and affect. His behavior is normal. Judgment and thought content normal.  Nursing note and vitals reviewed.   BP 124/65   Pulse 64   Temp (!) 97.4 F (36.3 C) (Oral)   Resp (!) 22   Ht 5\' 10"  (1.778 m)   Wt (!) 372 lb (168.7 kg)   SpO2 95% Comment: ROOM AIR  BMI 53.38 kg/m  Wt Readings from Last 3 Encounters:  04/12/19 (!) 372 lb (168.7 kg)  02/06/19 (!) 384 lb 3.2 oz (174.3 kg)  01/11/19 (!) 388 lb (176 kg)     Health Maintenance Due  Topic Date Due  . URINE MICROALBUMIN  12/20/1973  . COLONOSCOPY  12/20/2013  . INFLUENZA VACCINE  01/07/2019    There are no preventive care reminders to display for this patient.  Lab Results  Component Value Date   TSH 2.491 09/27/2016   Lab Results  Component Value Date   WBC 10.2 05/07/2017   HGB 12.8 (L) 05/07/2017   HCT 40.1 05/07/2017   MCV 88.3 05/07/2017   PLT 209 05/07/2017   Lab Results  Component Value Date   NA 141 06/23/2018   K 3.9 06/23/2018   CO2 21 06/23/2018   GLUCOSE 93 06/23/2018   BUN 7 06/23/2018   CREATININE 0.64 (L) 06/23/2018   BILITOT <0.2 06/23/2018   ALKPHOS 83 06/23/2018    AST 11 06/23/2018   ALT 16 06/23/2018   PROT 7.1 06/23/2018   ALBUMIN 4.3 06/23/2018   CALCIUM 9.2 06/23/2018   ANIONGAP 7 05/07/2017   GFR 115.49 08/25/2017   Lab Results  Component Value Date   CHOL 244 (H) 06/23/2018   Lab Results  Component Value Date   HDL 34 (L) 06/23/2018   Lab Results  Component Value Date   LDLCALC 161 (H) 06/23/2018   Lab Results  Component Value Date   TRIG 243 (H) 06/23/2018   Lab Results  Component Value Date   CHOLHDL 7.2 (H) 06/23/2018   Lab Results  Component Value Date   HGBA1C 6.1 (A) 04/12/2019   Assessment & Plan:   1. Class 3 severe obesity due to excess calories with serious comorbidity and body mass index (BMI) of 50.0 to 59.9 in adult [facility]) Body mass index is 53.38 kg/m. Goal BMI  is <30. Encouraged efforts to reduce weight include engaging in physical activity as tolerated with goal of 150 minutes per week. Improve dietary choices and eat a meal regimen consistent with a Mediterranean or DASH diet. Reduce simple carbohydrates. Do not skip meals and eat healthy snacks throughout the day to avoid over-eating at dinner. Set a goal weight loss that is achievable for you. - TSH  2. Hyperlipidemia LDL goal <100 - Lipid Panel With LDL/HDL Ratio  3. Type 2 diabetes mellitus without complication, without long-term current use of insulin (HCC) The current medical regimen is effective; Hgb A1c  is stable at 6.1 today; continue present plan and medications as prescribed. He will continue medication as prescribed, to decrease foods/beverages high in sugars and carbs and follow Heart Healthy or DASH diet. Increase physical activity to at least 30 minutes cardio exercise daily.  -  Comprehensive metabolic panel - POCT urinalysis dipstick - POCT glycosylated hemoglobin (Hb A1C) - POCT glucose (manual entry)  4. Chronic obstructive pulmonary disease, unspecified COPD type (HCC) Stable today. No signs or symptoms of respiratory distress noted  or reported.   5. OSA on CPAP  6. Macular degeneration of both eyes, unspecified type Continue eye drops as prescribed. Continue to follow up with Optometry as needed.  7. Weight loss He has had a 27 lb weight loss in 3 months.   8. History of PE Denies sudden onset of dyspnea and coughing. Denies Productive frothy, pink-tinged sputum. Denies tachycardia, pallor, feelings of impending doom. Denies symptoms of DVT, history of A-fib, any recent surgeries, pregnancy, long-bone fractures, and prolonged inactivity (sedentary, long distant traveling).   9. Shortness of breath Stable today.   10. UTI  11. Follow up He will follow up in 3 months.    No orders of the defined types were placed in this encounter.   Orders Placed This Encounter  Procedures  . POCT urinalysis dipstick  . POCT glycosylated hemoglobin (Hb A1C)  . POCT glucose (manual entry)    Referral Orders  No referral(s) requested today    Raliegh Ip,  MSN, FNP-BC [facility] [address] [place], [place] [address] [phone]- fax  Problem List Items Addressed This Visit      Respiratory   Chronic obstructive pulmonary disease (HCC)   OSA on CPAP     Other   Morbid obesity (HCC) - Primary (Chronic)    Other Visit Diagnoses    Hyperlipidemia LDL goal <100       Type 2 diabetes mellitus without complication, without long-term current use of insulin (HCC)       Relevant Orders   POCT urinalysis dipstick (Completed)   POCT glycosylated hemoglobin (Hb A1C) (Completed)   POCT glucose (manual entry) (Completed)   Macular degeneration of both eyes, unspecified type       Follow up          No orders of the defined types were placed in this encounter.   Follow-up: No follow-ups on file.    Kallie Locks, FNP

## 2019-04-13 LAB — COMPREHENSIVE METABOLIC PANEL
ALT: 15 IU/L (ref 0–44)
AST: 10 IU/L (ref 0–40)
Albumin/Globulin Ratio: 1.7 (ref 1.2–2.2)
Albumin: 4.5 g/dL (ref 3.8–4.9)
Alkaline Phosphatase: 84 IU/L (ref 39–117)
BUN/Creatinine Ratio: 10 (ref 9–20)
BUN: 7 mg/dL (ref 6–24)
Bilirubin Total: 0.3 mg/dL (ref 0.0–1.2)
CO2: 22 mmol/L (ref 20–29)
Calcium: 9.4 mg/dL (ref 8.7–10.2)
Chloride: 104 mmol/L (ref 96–106)
Creatinine, Ser: 0.71 mg/dL — ABNORMAL LOW (ref 0.76–1.27)
GFR calc Af Amer: 122 mL/min/{1.73_m2} (ref >59)
GFR calc non Af Amer: 106 mL/min/{1.73_m2} (ref >59)
Globulin, Total: 2.7 g/dL (ref 1.5–4.5)
Glucose: 84 mg/dL (ref 65–99)
Potassium: 3.8 mmol/L (ref 3.5–5.2)
Sodium: 142 mmol/L (ref 134–144)
Total Protein: 7.2 g/dL (ref 6.0–8.5)

## 2019-04-13 LAB — TSH: TSH: 1.82 u[IU]/mL (ref 0.450–4.500)

## 2019-04-13 LAB — LIPID PANEL WITH LDL/HDL RATIO
Cholesterol, Total: 119 mg/dL (ref 100–199)
HDL: 28 mg/dL — ABNORMAL LOW (ref >39)
LDL Chol Calc (NIH): 61 mg/dL (ref 0–99)
LDL/HDL Ratio: 2.2 ratio (ref 0.0–3.6)
Triglycerides: 175 mg/dL — ABNORMAL HIGH (ref 0–149)
VLDL Cholesterol Cal: 30 mg/dL (ref 5–40)

## 2019-04-14 LAB — URINE CULTURE: Organism ID, Bacteria: NO GROWTH

## 2019-04-21 ENCOUNTER — Encounter (HOSPITAL_COMMUNITY): Payer: Self-pay

## 2019-05-17 ENCOUNTER — Other Ambulatory Visit: Payer: Self-pay | Admitting: Primary Care

## 2019-05-17 NOTE — Telephone Encounter (Signed)
Received fax from [name] in [name] requesting refill on pred taper  Patient is not on maintenance prednisone Last office 8.31.2020 with [name]  Request denied - if pt is sick, needs to contact the office for visit.

## 2019-05-18 ENCOUNTER — Telehealth: Payer: Self-pay | Admitting: Pulmonary Disease

## 2019-05-18 MED ORDER — PREDNISONE 10 MG PO TABS: ORAL_TABLET | ORAL | 0 refills | Status: DC

## 2019-05-18 NOTE — Telephone Encounter (Signed)
Okay for prescription for new nebulizer Prednisone taper (40mg  x 3 days; 30mg  x 3 days; 20mg  x 3 days; 10mg  x 3 days

## 2019-05-18 NOTE — Telephone Encounter (Signed)
Spoke with pt's wife, she is requesting prednisone helps him when he has a flare up or to help with loosening his chest. It also helps minimize his cough and the symptoms started 2 days ago. He is doing his nebulizer meds, tried tessalon and cough syrup but his chest is still tight with wheezing. She denies fever and he he isn't coughing up anything. She states its the same thing he goes through every 2 months and wants to prevent him from having to go to the ED. They don't go out anywhere because of Covid.and have been very careful with unnecessary exposure. He also needs a RX for a new nebulizer machine. Beth saw pt last time and gave him a prednisone taper back in October and she states he was doing fine up until 2 days ago. RA please advise.   [facility]   Patient Instructions by Martyn Ehrich, NP at 03/17/2019 11:30 AM Author: Martyn Ehrich, NP Author Type: Nurse Practitioner Filed: 03/17/2019 12:07 PM  Note Status: Addendum Cosign: Cosign Not Required Encounter Date: 03/17/2019  Editor: Martyn Ehrich, NP (Nurse Practitioner)  Prior Versions: 1. Martyn Ehrich, NP (Nurse Practitioner) at 03/17/2019 12:07 PM - Addendum   2. Martyn Ehrich, NP (Nurse Practitioner) at 03/17/2019 12:03 PM - Signed    Pleasure speaking with you today, im sorry you are not feeling well Treating you for suspected COPD exacerbation, no signs of bacterial bronchitis  Rx: Prednisone taper (40mg  x 3 days; 30mg  x 3 days; 20mg  x 3 days; 10mg  x 3 days)  Recommendations: Continue Pulmicort twice daily and Atrovent four times daily  Use Albuterol every 6 hours for breakthrough shortness of breath  Smoking cessation strongly encouraged!!!  Follow-up 1-2 months with Dr. Elsworth Soho

## 2019-05-18 NOTE — Telephone Encounter (Signed)
Spoke with pt's wife and advised her that I would send in the Rx for prednisone to his pharmacy. She is going to call me back when she knows where she wants to buy one from because he currently doesn't have insurance. I advised her to call back and then we can fax it to the company. She agreed and nothing further is needed at this time.

## 2019-05-22 ENCOUNTER — Other Ambulatory Visit: Payer: Self-pay | Admitting: Pulmonary Disease

## 2019-06-23 ENCOUNTER — Encounter: Payer: Self-pay | Admitting: Pulmonary Disease

## 2019-06-23 ENCOUNTER — Telehealth (INDEPENDENT_AMBULATORY_CARE_PROVIDER_SITE_OTHER): Payer: Self-pay | Admitting: Pulmonary Disease

## 2019-06-23 DIAGNOSIS — Z9989 Dependence on other enabling machines and devices: Secondary | ICD-10-CM

## 2019-06-23 DIAGNOSIS — J418 Mixed simple and mucopurulent chronic bronchitis: Secondary | ICD-10-CM

## 2019-06-23 DIAGNOSIS — G4733 Obstructive sleep apnea (adult) (pediatric): Secondary | ICD-10-CM

## 2019-06-23 DIAGNOSIS — I2699 Other pulmonary embolism without acute cor pulmonale: Secondary | ICD-10-CM

## 2019-06-23 NOTE — Assessment & Plan Note (Signed)
Compliant with CPAP this has been objectively verified on prior CPAP downloads CPAP supplies will be renewed  Weight loss encouraged, compliance with goal of at least 4-6 hrs every night is the expectation. Advised against medications with sedative side effects Cautioned against driving when sleepy - understanding that sleepiness will vary on a day to day basis

## 2019-06-23 NOTE — Progress Notes (Signed)
   Subjective:    Patient ID: Timothy Thomas, male    DOB: 10/30/1965, 54 y.o.   MRN: [id number]  HPI    I connected with  Timothy Thomas on 06/23/19 by a video enabled telemedicine application and verified that I am speaking with the correct person using two identifiers.   I discussed the limitations of evaluation and management by telemedicine. The patient expressed understanding and agreed to proceed.  54 yo morbidly obese male active smoker followed for chronic cough, PE, lung nodule,and COPD Has OSA on CPAP .  09/2016  pulmonary embolism RV/LV ratio 4.2 and a non-STEMI, Venous duplex was negative. He was discharged on Xarelto but unfortunately developed another segmental PE in the left lower lobe in 03/2017 while on Xarelto.  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 Follow-up    [name] is doing follow up for COPD. Patient has dry cough. Patient just finished prednisone which got rid of his wheezing and made him feel better.    He had a COPD flareup in October and received prednisone taper and this helped him, wheezing and coughing have subsided since then. He still keeps his dry chronic cough,medications over-the-counter and Tessalon Perles seem to help  He is compliant with his CPAP Is questions about code vaccination  Significant tests/ events reviewed  Spirometry12/2018 >>moderate restriction with ratio of 83, FEV1 of 70% and FVC of 65%.  CT Angioof 09/2016 shows right lower lobe 1.0 cm nodule that is stable on follow-up CT in 03/2017, left lower lobe nodule is calcified and noted to be present in the past  CT chest 05/2017 stale 44mm RLL nodule 03/2018 CT chest-stable right lower lobe nodule 6 x 5 mm groundglass, has been noted since 2018 and has been stable, calcified granuloma left lung  CT chest 03/2019 stable right lower lobe nodule  Sleep study/CPAP titration June 2019  severe sleep apnea AHI 99/an hour, SP O2 low 83%, optimal control CPAP 13 cm H2O   Review of Systems neg for any significant sore throat, dysphagia, itching, sneezing, nasal congestion or excess/ purulent secretions, fever, chills, sweats, unintended wt loss, pleuritic or exertional cp, hempoptysis, orthopnea pnd or change in chronic leg swelling. Also denies presyncope, palpitations, heartburn, abdominal pain, nausea, vomiting, diarrhea or change in bowel or urinary habits, dysuria,hematuria, rash, arthralgias, visual complaints, headache, numbness weakness or ataxia.     Objective:   Physical Exam  Unable to since video visit No use of accessory muscles or audible wheezing   Total encounter time was 21 minutes    Assessment & Plan:

## 2019-06-23 NOTE — Assessment & Plan Note (Signed)
Warfarin lifelong, last INR was 2.1

## 2019-06-23 NOTE — Assessment & Plan Note (Signed)
Continue budesonide and duo nebs every 6 Appears stable Smoking cessation again emphasized

## 2019-06-27 ENCOUNTER — Other Ambulatory Visit: Payer: Self-pay | Admitting: Pulmonary Disease

## 2019-06-27 ENCOUNTER — Telehealth: Payer: Self-pay | Admitting: Pulmonary Disease

## 2019-06-27 ENCOUNTER — Other Ambulatory Visit: Payer: Self-pay | Admitting: Family Medicine

## 2019-06-27 DIAGNOSIS — E785 Hyperlipidemia, unspecified: Secondary | ICD-10-CM

## 2019-06-27 DIAGNOSIS — J449 Chronic obstructive pulmonary disease, unspecified: Secondary | ICD-10-CM

## 2019-06-27 MED ORDER — ALBUTEROL SULFATE (2.5 MG/3ML) 0.083% IN NEBU: 2.5000 mg | INHALATION_SOLUTION | Freq: Four times a day (QID) | RESPIRATORY_TRACT | 6 refills | Status: DC | PRN

## 2019-06-27 NOTE — Telephone Encounter (Signed)
Spoke with pt and advised rx sent to pharmacy. Nothing further is needed.   

## 2019-07-06 ENCOUNTER — Telehealth: Payer: Self-pay | Admitting: Pulmonary Disease

## 2019-07-06 MED ORDER — PREDNISONE 10 MG PO TABS: ORAL_TABLET | ORAL | 0 refills | Status: DC

## 2019-07-06 NOTE — Telephone Encounter (Signed)
Spoke with pt;s wife and she reports pt is having chest tightness and is requesting a Rx for prednisone. He has minimal cough but when he coughs it's dry and he feels SOB. He denies fever, body aches, or chills. He has taken his nebulizer medications as prescribed but no relief. The wife states the prednisone is the only thing that opens his lungs. Dr. Vassie Loll please advise.

## 2019-07-06 NOTE — Telephone Encounter (Signed)
Pt's wife returning call.  [phone]

## 2019-07-06 NOTE — Telephone Encounter (Signed)
ATC Stacy, no answer. Left message for her to call back.

## 2019-07-06 NOTE — Telephone Encounter (Signed)
° °  Prednisone 10 mg tabs Take 4 tabs  daily with food x 4 days, then 3 tabs daily x 4 days, then 2 tabs daily x 4 days, then 1 tab daily x4 days then stop. #40 ° °

## 2019-07-06 NOTE — Telephone Encounter (Signed)
Returned call to patient wife notified Dr.Alva has agreed to send in Prednisone taper to be taken as directed. She verbalizes understanding. Rx has been sent, nothing further needed.

## 2019-07-12 ENCOUNTER — Ambulatory Visit: Payer: Self-pay | Admitting: Family Medicine

## 2019-07-26 ENCOUNTER — Other Ambulatory Visit: Payer: Self-pay | Admitting: Pulmonary Disease

## 2019-07-26 DIAGNOSIS — R05 Cough: Secondary | ICD-10-CM

## 2019-07-26 DIAGNOSIS — R053 Chronic cough: Secondary | ICD-10-CM

## 2019-08-07 DIAGNOSIS — R339 Retention of urine, unspecified: Secondary | ICD-10-CM

## 2019-08-07 HISTORY — DX: Retention of urine, unspecified: R33.9

## 2019-08-08 ENCOUNTER — Telehealth: Payer: Self-pay | Admitting: Family Medicine

## 2019-08-08 NOTE — Telephone Encounter (Signed)
Please call pt back. Appointment tomorrow. Called wanting to know if he needs to come early for his appointment tomorrow to get his hemaglobin checked.

## 2019-08-08 NOTE — Telephone Encounter (Signed)
Pt was called and reminded of there appointment 

## 2019-08-09 ENCOUNTER — Other Ambulatory Visit: Payer: Self-pay

## 2019-08-09 ENCOUNTER — Ambulatory Visit (INDEPENDENT_AMBULATORY_CARE_PROVIDER_SITE_OTHER): Payer: Medicare HMO | Admitting: Family Medicine

## 2019-08-09 ENCOUNTER — Encounter: Payer: Self-pay | Admitting: Family Medicine

## 2019-08-09 VITALS — BP 127/60 | HR 67 | Temp 97.8°F | Ht 70.0 in | Wt 379.0 lb

## 2019-08-09 DIAGNOSIS — Z6841 Body Mass Index (BMI) 40.0 and over, adult: Secondary | ICD-10-CM

## 2019-08-09 DIAGNOSIS — E119 Type 2 diabetes mellitus without complications: Secondary | ICD-10-CM | POA: Diagnosis not present

## 2019-08-09 DIAGNOSIS — Z9189 Other specified personal risk factors, not elsewhere classified: Secondary | ICD-10-CM

## 2019-08-09 DIAGNOSIS — R8290 Unspecified abnormal findings in urine: Secondary | ICD-10-CM

## 2019-08-09 DIAGNOSIS — Z9989 Dependence on other enabling machines and devices: Secondary | ICD-10-CM

## 2019-08-09 DIAGNOSIS — R0602 Shortness of breath: Secondary | ICD-10-CM | POA: Diagnosis not present

## 2019-08-09 DIAGNOSIS — J449 Chronic obstructive pulmonary disease, unspecified: Secondary | ICD-10-CM

## 2019-08-09 DIAGNOSIS — G4733 Obstructive sleep apnea (adult) (pediatric): Secondary | ICD-10-CM

## 2019-08-09 DIAGNOSIS — Z889 Allergy status to unspecified drugs, medicaments and biological substances status: Secondary | ICD-10-CM

## 2019-08-09 DIAGNOSIS — R339 Retention of urine, unspecified: Secondary | ICD-10-CM

## 2019-08-09 DIAGNOSIS — E66813 Obesity, class 3: Secondary | ICD-10-CM

## 2019-08-09 DIAGNOSIS — H3530 Unspecified macular degeneration: Secondary | ICD-10-CM

## 2019-08-09 DIAGNOSIS — Z09 Encounter for follow-up examination after completed treatment for conditions other than malignant neoplasm: Secondary | ICD-10-CM

## 2019-08-09 LAB — POCT URINALYSIS DIPSTICK
Bilirubin, UA: NEGATIVE
Blood, UA: NEGATIVE
Glucose, UA: NEGATIVE
Ketones, UA: NEGATIVE
Nitrite, UA: NEGATIVE
Protein, UA: NEGATIVE
Spec Grav, UA: >=1.03 — AB (ref 1.010–1.025)
Urobilinogen, UA: 0.2 E.U./dL
pH, UA: 5.5 (ref 5.0–8.0)

## 2019-08-09 NOTE — Progress Notes (Signed)
[facility]    Established Patient Office Visit  Subjective:  Patient ID: Timothy Thomas, male    DOB: 1965/08/16  Age: 54 y.o. MRN: [id number]  CC:  Chief Complaint  Patient presents with   Follow-up    HPI Timothy Thomas is a 54 year old male who presents for Follow Up today.   Past Medical History:  Diagnosis Date   Aphasia    Transient - negative head CT and EEG with neurology workup February 2017 [facility])   COPD (chronic obstructive pulmonary disease) (HCC)    Glaucoma    Macular degeneration    Myocardial infarction (HCC)    Obesity    Obstructive sleep apnea    Tachycardia-bradycardia syndrome (HCC)    Status post pacemaker   Urinary retention 08/2019   Variant angina (HCC)    History of normal coronary arteries at cardiac catheterization 2002 - vasospasm noted   Vasovagal syncope    Current Status: Since he last office visit, he has c/o urinary retention X 1 year. He continues to follow up with Optometry today. He is using CPAP nightly and regularly follows up with Pulmonology as needed. Reports occasional shortness of breath and cough. Denies chest pain, and heart palpitations reported. He denies fevers, chills, fatigue, recent infections, weight loss, and night sweats. He has not had any headaches, visual changes, dizziness, and falls. No reports of GI problems such as nausea, vomiting, diarrhea, and constipation. He has no reports of blood in stools, dysuria and hematuria. No depression or anxiety, and denies suicidal ideations, homicidal ideations, or auditory hallucinations. He denies pain today.   Past Surgical History:  Procedure Laterality Date   LEFT HEART CATH AND CORONARY ANGIOGRAPHY N/A 09/28/2016   Procedure: Left Heart Cath and Coronary Angiography;  Surgeon: Orpah Cobb, MD;  Location: [facility];  Service: Cardiovascular;  Laterality: N/A;   PACEMAKER INSERTION      Family  History  Problem Relation Age of Onset   Hypertension Mother    Diabetes Father    Hypertension Father    Stroke Father     Social History   Socioeconomic History   Marital status: Married    Spouse name: Not on file   Number of children: Not on file   Years of education: Not on file   Highest education level: Not on file  Occupational History   Not on file  Tobacco Use   Smoking status: Current Every Day Smoker    Packs/day: 0.25    Years: 40.00    Pack years: 10.00    Types: Cigarettes    Start date: 09/26/1976   Smokeless tobacco: Never Used   Tobacco comment: currently smoking 0.5ppd as of 07/29/2018  Substance and Sexual Activity   Alcohol use: No   Drug use: No   Sexual activity: Yes  Other Topics Concern   Not on file  Social History Narrative   Not on file   Social Determinants of Health   Financial Resource Strain:    Difficulty of Paying Living Expenses: Not on file  Food Insecurity:    Worried About Programme researcher, broadcasting/film/video in the Last Year: Not on file   [employer] of Food in the Last Year: Not on file  Transportation Needs:    Lack of Transportation (Medical): Not on file   Lack of Transportation (Non-Medical): Not on file  Physical Activity:    Days of Exercise per Week:  Not on file   Minutes of Exercise per Session: Not on file  Stress:    Feeling of Stress : Not on file  Social Connections:    Frequency of Communication with Friends and Family: Not on file   Frequency of Social Gatherings with Friends and Family: Not on file   Attends Religious Services: Not on file   Active Member of Clubs or Organizations: Not on file   Attends Banker Meetings: Not on file   Marital Status: Not on file  Intimate Partner Violence:    Fear of Current or Ex-Partner: Not on file   Emotionally Abused: Not on file   Physically Abused: Not on file   Sexually Abused: Not on file    Outpatient Medications Prior to Visit    Medication Sig Dispense Refill   albuterol (PROVENTIL) (2.5 MG/3ML) 0.083% nebulizer solution Take 3 mLs (2.5 mg total) by nebulization every 6 (six) hours as needed for wheezing or shortness of breath. DX: J44.9 360 mL 6   albuterol (VENTOLIN HFA) 108 (90 Base) MCG/ACT inhaler INHALE TWO PUFFS BY MOUTH EVERY 6 HOURS AS NEEDED FOR WHEEZING AND FOR SHORTNESS OF BREATH 18 g 3   aspirin EC 81 MG tablet Take 81 mg by mouth daily.     budesonide (PULMICORT) 0.5 MG/2ML nebulizer solution Take 2 mLs (0.5 mg total) by nebulization 2 (two) times daily. 120 mL 12   chlorpheniramine (CHLOR-TRIMETON) 4 MG tablet Take 4 mg by mouth 2 (two) times daily as needed for allergies.     diphenhydramine-acetaminophen (TYLENOL PM) 25-500 MG TABS tablet Take 3 tablets by mouth at bedtime.      Docusate Sodium (COLACE PO) Take by mouth.     furosemide (LASIX) 20 MG tablet 1-2 tabs daily for leg swelling 90 tablet 1   ipratropium (ATROVENT) 0.02 % nebulizer solution Take 2.5 mLs (0.5 mg total) by nebulization 4 (four) times daily. DX: J44.9 225 mL 5   metFORMIN (GLUCOPHAGE) 500 MG tablet Take 1 tablet (500 mg total) by mouth 2 (two) times daily with a meal. 180 tablet 3   omeprazole (PRILOSEC) 20 MG capsule TAKE ONE CAPSULE BY MOUTH DAILY 30 capsule 6   omeprazole (PRILOSEC) 20 MG capsule TAKE ONE CAPSULE BY MOUTH DAILY     predniSONE (DELTASONE) 10 MG tablet Take 4 tabs  daily with food x 4 days, then 3 tabs daily x 4 days, then 2 tabs daily x 4 days, then 1 tab daily x4 days then stop. 40 tablet 0   warfarin (COUMADIN) 1 MG tablet TAKE 4 TABLETS WITH ONE 5MG  TABLET DAILY TO TOTAL 9 MG DAILY     warfarin (COUMADIN) 5 MG tablet Take 8 mg by mouth daily.      furosemide (LASIX) 20 MG tablet 1-2 tabs daily for leg swelling     atorvastatin (LIPITOR) 40 MG tablet TAKE ONE TABLET BY MOUTH EVERY EVENING AT 6PM 90 tablet 0   benzonatate (TESSALON) 200 MG capsule Take 1 capsule (200 mg total) by mouth 2 (two)  times daily as needed for cough. 60 capsule 0   metFORMIN (GLUCOPHAGE) 500 MG tablet Take by mouth.     No facility-administered medications prior to visit.    Allergies  Allergen Reactions   Codeine Nausea And Vomiting    Low tolerance to narcotics   Ketorolac Nausea And Vomiting    Vomiting      ROS Review of Systems  Constitutional: Negative.   HENT: Negative.  Eyes: Negative.   Respiratory: Negative.   Cardiovascular: Negative.   Gastrointestinal: Negative.   Endocrine: Negative.   Genitourinary: Negative.   Musculoskeletal: Negative.   Skin: Negative.   Allergic/Immunologic: Negative.   Neurological: Positive for dizziness (occasional ) and headaches (occasional ).  Hematological: Negative.   Psychiatric/Behavioral: Negative.       Objective:    Physical Exam  Constitutional: He is oriented to person, place, and time. He appears well-developed and well-nourished.  HENT:  Head: Normocephalic and atraumatic.  Eyes: Conjunctivae are normal.  Cardiovascular: Normal rate, regular rhythm, normal heart sounds and intact distal pulses.  Pulmonary/Chest: Effort normal and breath sounds normal.  Abdominal: Soft. Bowel sounds are normal.  Musculoskeletal:     Cervical back: Normal range of motion and neck supple.  Neurological: He is alert and oriented to person, place, and time. He has normal reflexes.  Skin: Skin is warm and dry.  Psychiatric: He has a normal mood and affect. His behavior is normal. Judgment and thought content normal.  Nursing note and vitals reviewed.   BP 127/60    Pulse 67    Temp 97.8 F (36.6 C)    Ht 5\' 10"  (1.778 m)    Wt (!) 379 lb (171.9 kg)    SpO2 94%    BMI 54.38 kg/m  Wt Readings from Last 3 Encounters:  08/09/19 (!) 379 lb (171.9 kg)  06/23/19 (!) 362 lb (164.2 kg)  04/12/19 (!) 372 lb (168.7 kg)     Health Maintenance Due  Topic Date Due   URINE MICROALBUMIN  12/20/1973   COLONOSCOPY  12/20/2013    There are no  preventive care reminders to display for this patient.  Lab Results  Component Value Date   TSH 1.820 04/12/2019   Lab Results  Component Value Date   WBC 10.2 05/07/2017   HGB 12.8 (L) 05/07/2017   HCT 40.1 05/07/2017   MCV 88.3 05/07/2017   PLT 209 05/07/2017   Lab Results  Component Value Date   NA 142 04/12/2019   K 3.8 04/12/2019   CO2 22 04/12/2019   GLUCOSE 84 04/12/2019   BUN 7 04/12/2019   CREATININE 0.71 (L) 04/12/2019   BILITOT 0.3 04/12/2019   ALKPHOS 84 04/12/2019   AST 10 04/12/2019   ALT 15 04/12/2019   PROT 7.2 04/12/2019   ALBUMIN 4.5 04/12/2019   CALCIUM 9.4 04/12/2019   ANIONGAP 7 05/07/2017   GFR 115.49 08/25/2017   Lab Results  Component Value Date   CHOL 119 04/12/2019   Lab Results  Component Value Date   HDL 28 (L) 04/12/2019   Lab Results  Component Value Date   LDLCALC 61 04/12/2019   Lab Results  Component Value Date   TRIG 175 (H) 04/12/2019   Lab Results  Component Value Date   CHOLHDL 7.2 (H) 06/23/2018   Lab Results  Component Value Date   HGBA1C 6.1 (A) 04/12/2019    Assessment & Plan:   1. Chronic obstructive pulmonary disease, unspecified COPD type ([place]) Stable. No signs or symptoms of respiratory distress noted or reported.   2. Shortness of breath Stable.   3. Type 2 diabetes mellitus without complication, without long-term current use of insulin ([place]) He will continue medication as prescribed, to decrease foods/beverages high in sugars and carbs and follow Heart Healthy or DASH diet. Increase physical activity to at least 30 minutes cardio exercise daily. - POCT urinalysis dipstick  4. OSA on CPAP  5. Macular degeneration  of both eyes, unspecified type Stable. Continue follow ups with Optometry as needed.  6. Class 3 severe obesity due to excess calories with serious comorbidity and body mass index (BMI) of 50.0 to 59.9 in adult [facility]) Body mass index is 54.38 kg/m. Goal BMI  is <30. Encouraged efforts to  reduce weight include engaging in physical activity as tolerated with goal of 150 minutes per week. Improve dietary choices and eat a meal regimen consistent with a Mediterranean or DASH diet. Reduce simple carbohydrates. Do not skip meals and eat healthy snacks throughout the day to avoid over-eating at dinner. Set a goal weight loss that is achievable for you.  7. Urinary retention - PSA  8. Abnormal urinalysis Results are pending.  - Urine Culture  9. H/O multiple allergies  10. Follow up He will follow up in 6 months.   No orders of the defined types were placed in this encounter.   Orders Placed This Encounter  Procedures   Urine Culture   PSA   POCT urinalysis dipstick    Referral Orders  No referral(s) requested today    Raliegh Ip,  MSN, FNP-BC [facility] [address] [place], [place] [address] [phone] fax   Problem List Items Addressed This Visit      Respiratory   Chronic obstructive pulmonary disease (HCC) - Primary   OSA on CPAP    Other Visit Diagnoses    Shortness of breath       Type 2 diabetes mellitus without complication, without long-term current use of insulin (HCC)       Relevant Orders   POCT urinalysis dipstick (Completed)   Macular degeneration of both eyes, unspecified type       Class 3 severe obesity due to excess calories with serious comorbidity and body mass index (BMI) of 50.0 to 59.9 in adult [facility])       Urinary retention       Relevant Orders   PSA (Completed)   Abnormal urinalysis       Relevant Orders   Urine Culture (Completed)   H/O multiple allergies       Follow up          No orders of the defined types were placed in this encounter.   Follow-up: Return in about 6 months (around 02/09/2020).    Kallie Locks, FNP

## 2019-08-10 LAB — PSA: Prostate Specific Ag, Serum: 0.3 ng/mL (ref 0.0–4.0)

## 2019-08-11 LAB — URINE CULTURE: Organism ID, Bacteria: NO GROWTH

## 2019-08-13 DIAGNOSIS — R0602 Shortness of breath: Secondary | ICD-10-CM | POA: Insufficient documentation

## 2019-08-13 DIAGNOSIS — H3530 Unspecified macular degeneration: Secondary | ICD-10-CM | POA: Insufficient documentation

## 2019-08-13 DIAGNOSIS — E119 Type 2 diabetes mellitus without complications: Secondary | ICD-10-CM | POA: Insufficient documentation

## 2019-08-14 ENCOUNTER — Telehealth: Payer: Self-pay | Admitting: Family Medicine

## 2019-08-14 NOTE — Telephone Encounter (Signed)
Pt called wanting to know results for PSA test. Please call pt back.

## 2019-09-12 ENCOUNTER — Telehealth: Payer: Self-pay | Admitting: Pulmonary Disease

## 2019-09-12 NOTE — Telephone Encounter (Signed)
Wheezing and chest tightness x 2 days  No SOB or cough  No f/c/s, body aches  mychart visit with SG tomorrow at 4 pm

## 2019-09-13 ENCOUNTER — Ambulatory Visit (INDEPENDENT_AMBULATORY_CARE_PROVIDER_SITE_OTHER): Payer: Medicare HMO | Admitting: Acute Care

## 2019-09-13 ENCOUNTER — Other Ambulatory Visit: Payer: Self-pay

## 2019-09-13 ENCOUNTER — Encounter: Payer: Self-pay | Admitting: Acute Care

## 2019-09-13 DIAGNOSIS — J440 Chronic obstructive pulmonary disease with acute lower respiratory infection: Secondary | ICD-10-CM

## 2019-09-13 DIAGNOSIS — Z720 Tobacco use: Secondary | ICD-10-CM

## 2019-09-13 DIAGNOSIS — F17210 Nicotine dependence, cigarettes, uncomplicated: Secondary | ICD-10-CM | POA: Diagnosis not present

## 2019-09-13 DIAGNOSIS — J209 Acute bronchitis, unspecified: Secondary | ICD-10-CM | POA: Diagnosis not present

## 2019-09-13 DIAGNOSIS — G4733 Obstructive sleep apnea (adult) (pediatric): Secondary | ICD-10-CM

## 2019-09-13 MED ORDER — PREDNISONE 10 MG PO TABS: ORAL_TABLET | ORAL | 0 refills | Status: DC

## 2019-09-13 NOTE — Progress Notes (Signed)
Virtual Visit via Telephone Note  I connected with Timothy Thomas on 09/13/19 at  4:00 PM EDT by telephone and verified that I am speaking with the correct person using two identifiers.  Location: Patient: At home Provider: Working remotely from home   I discussed the limitations, risks, security and privacy concerns of performing an evaluation and management service by telephone and the availability of in person appointments. I also discussed with the patient that there may be a patient responsible charge related to this service. The patient expressed understanding and agreed to proceed.  Synopsis 54 year old male, current smoker. PMH significant for OSA on CPAP, COPD, chronic cough, pulmonary embolism, pulmonary nodule, tobacco abuse, obesity. Patient of Dr. Vassie Loll, last seen by Dr. Vassie Loll 06/23/2019   History of Present Illness: Pt. Presents today for chest tightness and wheezing. He states this happens about once every other month. He states he is compliant with his Pulmicort, Ventolin and Atrovent nebs daily. He states he does not have any secretions. Just chest tightness and wheezing. He denies any sinus problems or productive cough. He denies any fever, chills.Last prednisone taper was 07/06/2019.No full PFT's on file. He states he is compliant with his CPAP therapy nightly. I do not have a down load to review.   Observations/Objective: Spirometry12/2018 >>moderate restriction with ratio of 83, FEV1 of 70% and FVC of 65%.  CT Angioof 09/2016 shows right lower lobe 1.0 cm nodule that is stable on follow-up CT in 03/2017, left lower lobe nodule is calcified and noted to be present in the past  CT chest 05/2017 stale 30mm RLL nodule 03/2018 CT chest-stable right lower lobe nodule 6 x 5 mm groundglass, has been noted since 2018 and has been stable, calcified granuloma left lung  Sleep study/CPAP titration June 2019 severe sleep apnea AHI 99/an hour, SP O2 low 83%, optimal control CPAP  13 cm H2O   Assessment and Plan: Bronchitis Flare Plan We will send in a prednisone taper Prednisone taper; 10 mg tablets: 4 tabs x 2 days, 3 tabs x 2 days, 2 tabs x 2 days 1 tab x 2 days then stop. Continue budesonide and duo nebs every 6 hours We will add Zyrtec/ Xyzol, or Allegra daily for a month to see if he has any improvement with flares. Please use generics We will schedule [facility] to better evaluate for any additional issues that may be causing frequency of flares.  OSA Compliant per patient No Down Load to review Plan Continue on CPAP at bedtime. You appear to be benefiting from the treatment  Goal is to wear for at least 6 hours each night for maximal clinical benefit. Continue to work on weight loss, as the link between excess weight  and sleep apnea is well established.   Remember to establish a good bedtime routine, and work on sleep hygiene.  Limit daytime naps , avoid stimulants such as caffeine and nicotine close to bedtime, exercise daily to promote sleep quality, avoid heavy , spicy, fried , or rich foods before bed. Ensure adequate exposure to natural light during the day,establish a relaxing bedtime routine with a pleasant sleep environment ( Bedroom between 60 and 67 degrees, turn off bright lights , TV or device screens screens , consider black out curtains or white noise machines) Do not drive if sleepy. Remember to clean mask, tubing, filter, and reservoir once weekly with soapy water.  Follow up with  Maralyn Sago NP   In 2 weeks  or before as needed.  ( Tele Visit)  Tobacco Abuse I have spent 3 minutes counseling patient on smoking cessation this visit. Patient verbalizes understanding that by  Continuing to  Smoke he has   negative health consequences including worsening of COPD, risk of lung cancer , stroke and heart disease..     Follow Up Instructions: Follow up tele visit in 2 weeks with Judson Roch NP to ensure improvement   I discussed the  assessment and treatment plan with the patient. The patient was provided an opportunity to ask questions and all were answered. The patient agreed with the plan and demonstrated an understanding of the instructions.   The patient was advised to call back or seek an in-person evaluation if the symptoms worsen or if the condition fails to improve as anticipated.  I provided 30 minutes of non-face-to-face time during this encounter.   Magdalen Spatz, NP

## 2019-09-27 ENCOUNTER — Other Ambulatory Visit: Payer: Self-pay | Admitting: Pulmonary Disease

## 2019-09-27 ENCOUNTER — Other Ambulatory Visit: Payer: Self-pay | Admitting: Internal Medicine

## 2019-09-27 DIAGNOSIS — E785 Hyperlipidemia, unspecified: Secondary | ICD-10-CM

## 2019-09-28 ENCOUNTER — Telehealth: Payer: Self-pay | Admitting: *Deleted

## 2019-09-28 NOTE — Telephone Encounter (Signed)
Called patient to make aware he needs to schedule 2 week televisit with SG. Patient either hung up or we disconnected.

## 2019-10-03 ENCOUNTER — Other Ambulatory Visit: Payer: Self-pay

## 2019-10-03 ENCOUNTER — Other Ambulatory Visit (HOSPITAL_COMMUNITY)
Admission: RE | Admit: 2019-10-03 | Discharge: 2019-10-03 | Disposition: A | Discharge status: Home / Self Care | Payer: Medicare HMO | Source: Ambulatory Visit | Attending: Acute Care | Admitting: Acute Care

## 2019-10-03 DIAGNOSIS — Z01812 Encounter for preprocedural laboratory examination: Secondary | ICD-10-CM | POA: Diagnosis present

## 2019-10-03 DIAGNOSIS — Z20822 Contact with and (suspected) exposure to covid-19: Secondary | ICD-10-CM | POA: Diagnosis not present

## 2019-10-04 LAB — SARS CORONAVIRUS 2 (TAT 6-24 HRS): SARS Coronavirus 2: NEGATIVE

## 2019-10-06 ENCOUNTER — Ambulatory Visit (HOSPITAL_COMMUNITY)
Admission: RE | Admit: 2019-10-06 | Discharge: 2019-10-06 | Disposition: A | Discharge status: Home / Self Care | Payer: Medicare HMO | Source: Ambulatory Visit | Attending: Acute Care | Admitting: Acute Care

## 2019-10-06 ENCOUNTER — Other Ambulatory Visit: Payer: Self-pay

## 2019-10-06 DIAGNOSIS — J209 Acute bronchitis, unspecified: Secondary | ICD-10-CM | POA: Diagnosis present

## 2019-10-06 DIAGNOSIS — J440 Chronic obstructive pulmonary disease with acute lower respiratory infection: Secondary | ICD-10-CM | POA: Diagnosis not present

## 2019-10-06 LAB — PULMONARY FUNCTION TEST
DL/VA % pred: 96 %
DL/VA: 4.2 ml/min/mmHg/L
DLCO unc % pred: 63 %
DLCO unc: 18.1 ml/min/mmHg
FEF 25-75 Post: 3.04 L/sec
FEF 25-75 Pre: 2.11 L/sec
FEF2575-%Change-Post: 44 %
FEF2575-%Pred-Post: 94 %
FEF2575-%Pred-Pre: 65 %
FEV1-%Change-Post: 13 %
FEV1-%Pred-Post: 64 %
FEV1-%Pred-Pre: 56 %
FEV1-Post: 2.45 L
FEV1-Pre: 2.15 L
FEV1FVC-%Change-Post: 0 %
FEV1FVC-%Pred-Pre: 104 %
FEV6-%Change-Post: 14 %
FEV6-%Pred-Post: 64 %
FEV6-%Pred-Pre: 56 %
FEV6-Post: 3.06 L
FEV6-Pre: 2.67 L
FEV6FVC-%Change-Post: 0 %
FEV6FVC-%Pred-Post: 103 %
FEV6FVC-%Pred-Pre: 104 %
FVC-%Change-Post: 14 %
FVC-%Pred-Post: 62 %
FVC-%Pred-Pre: 54 %
FVC-Post: 3.07 L
FVC-Pre: 2.69 L
Post FEV1/FVC ratio: 80 %
Post FEV6/FVC ratio: 100 %
Pre FEV1/FVC ratio: 80 %
Pre FEV6/FVC Ratio: 100 %
RV % pred: 161 %
RV: 3.45 L
TLC % pred: 92 %
TLC: 6.48 L

## 2019-10-06 MED ORDER — ALBUTEROL SULFATE (2.5 MG/3ML) 0.083% IN NEBU
2.5000 mg | INHALATION_SOLUTION | Freq: Once | RESPIRATORY_TRACT | Status: AC
  Administered 2019-10-06: 2.5 mg via RESPIRATORY_TRACT

## 2019-10-16 ENCOUNTER — Other Ambulatory Visit: Payer: Self-pay

## 2019-10-16 ENCOUNTER — Ambulatory Visit (INDEPENDENT_AMBULATORY_CARE_PROVIDER_SITE_OTHER): Payer: Medicare HMO | Admitting: Acute Care

## 2019-10-16 ENCOUNTER — Encounter: Payer: Self-pay | Admitting: Acute Care

## 2019-10-16 DIAGNOSIS — Z9989 Dependence on other enabling machines and devices: Secondary | ICD-10-CM

## 2019-10-16 DIAGNOSIS — F17210 Nicotine dependence, cigarettes, uncomplicated: Secondary | ICD-10-CM

## 2019-10-16 DIAGNOSIS — G4733 Obstructive sleep apnea (adult) (pediatric): Secondary | ICD-10-CM

## 2019-10-16 DIAGNOSIS — J441 Chronic obstructive pulmonary disease with (acute) exacerbation: Secondary | ICD-10-CM

## 2019-10-16 DIAGNOSIS — Z720 Tobacco use: Secondary | ICD-10-CM

## 2019-10-16 MED ORDER — ALBUTEROL SULFATE HFA 108 (90 BASE) MCG/ACT IN AERS: INHALATION_SPRAY | RESPIRATORY_TRACT | 3 refills | Status: DC

## 2019-10-16 NOTE — Addendum Note (Signed)
Addended by: Abigail Miyamoto D on: 10/16/2019 10:19 AM   Modules accepted: Orders

## 2019-10-16 NOTE — Patient Instructions (Addendum)
It was great to talk with you  Continue budesonide and duo nebs scheduled as you have been doing Continue using Albuterol for rescue as you have been doing Continue Zyrtec daily as you have been doing Continue Albuterol as rescue as you have been doing We will send in a prescription  for your albuterol rescue inhaler Call for flares. Follow up in 4 months with Dr. Vassie Loll or Maralyn Sago NP Please contact office for sooner follow up if symptoms do not improve or worsen or seek emergency care  We will send in a prescription for a CPAP face mask. Continue on CPAP at bedtime. You appear to be benefiting from the treatment  Goal is to wear for at least 6 hours each night for maximal clinical benefit. Continue to work on weight loss, as the link between excess weight  and sleep apnea is well established.   Remember to establish a good bedtime routine, and work on sleep hygiene.  Limit daytime naps , avoid stimulants such as caffeine and nicotine close to bedtime, exercise daily to promote sleep quality, avoid heavy , spicy, fried , or rich foods before bed. Ensure adequate exposure to natural light during the day,establish a relaxing bedtime routine with a pleasant sleep environment ( Bedroom between 60 and 67 degrees, turn off bright lights , TV or device screens screens , consider black out curtains or white noise machines) Do not drive if sleepy. Remember to clean mask, tubing, filter, and reservoir once weekly with soapy water.  Follow up with Dr. Vassie Loll or Maralyn Sago NP   In 4 months  or before as needed.          Note your daily symptoms > remember "red flags" for COPD: Increase in cough, increase in sputum production, increase in shortness of breath or activity intolerance. If you notice these symptoms, please call to be seen.

## 2019-10-16 NOTE — Progress Notes (Addendum)
Virtual Visit via Telephone Note  I connected with Timothy Thomas on 10/16/19 at  9:30 AM EDT by telephone and verified that I am speaking with the correct person using two identifiers.  Location: Patient: At home Provider: [address] [place], [place], [address]   I discussed the limitations, risks, security and privacy concerns of performing an evaluation and management service by telephone and the availability of in person appointments. I also discussed with the patient that there may be a patient responsible charge related to this service. The patient expressed understanding and agreed to proceed.  Synopsis 54 year old male, current smoker. PMH significant for OSA on CPAP, COPD, chronic cough, pulmonary embolism, pulmonary nodule, tobacco abuse, obesity. Patient of Dr. Vassie Loll, last seen by Dr. Vassie Loll 06/23/2019   History of Present Illness: Pt. Presents for 1 month follow up. He was seen 09/13/2019 for acute bronchitis flare. He was treated with a  Prednisone taper  and he was asked to add a daily non-sedating antihistamine. Additionally he was scheduled for PFT's at [facility]. He was scheduled for a 2 week follow up, but this was scheduled at 1 month. He states he did well on the prednisone. He has been compliant with using his Zyrtec. He states he  does have some coughing spells that are triggered by certain smells. Scented  candles, or the smell for frying bacon. PFT's are noted below. He is copliant with his CPAP therapy. AHI of 1.4 with 100% compliance. He denies any fever, chest pain, orthopnea of hemoptysis.    Observations/Objective: Spirometry12/2018 >>moderate restriction with ratio of 83, FEV1 of 70% and FVC of 65%.  PFT's 09/2019                CT Angioof 09/2016 shows right lower lobe 1.0 cm nodule that is stable on follow-up CT in 03/2017, left lower lobe nodule is calcified and noted to be present in the past  CT chest 05/2017 stale 42mm RLL  nodule 03/2018 CT chest-stable right lower lobe nodule 6 x 5 mm groundglass, has been noted since 2018 and has been stable, calcified granuloma left lung  Sleep study/CPAP titration June 2019 severe sleep apnea AHI 99/an hour, SP O2 low 83%, optimal control CPAP 13 cm H2O   Assessment and Plan: COPD with bronchitis Plan Continue budesonide and duo nebs scheduled as you have been doing Continue using Albuterol for rescue as you have been doing Continue Zyrtec daily as you have been doing Continue Albuterol as rescue as you have been doing We will send in a prescription  for your albuterol rescue inhaler Follow up in 4 months with Dr. Vassie Loll or Maralyn Sago NP Please contact office for sooner follow up if symptoms do not improve or worsen or seek emergency care   OSA 100% Compliant with therapy Uses for 6 + hours per night Good Control of AHI at 1.4 Median pressure 13.9 cm H2O Plan We will send in a prescription for a CPAP face mask. Continue on CPAP at bedtime. You appear to be benefiting from the treatment  Goal is to wear for at least 6 hours each night for maximal clinical benefit. Continue to work on weight loss, as the link between excess weight  and sleep apnea is well established.   Remember to establish a good bedtime routine, and work on sleep hygiene.  Limit daytime naps , avoid stimulants such as caffeine and nicotine close to bedtime, exercise daily to promote sleep quality, avoid  heavy , spicy, fried , or rich foods before bed. Ensure adequate exposure to natural light during the day,establish a relaxing bedtime routine with a pleasant sleep environment ( Bedroom between 60 and 67 degrees, turn off bright lights , TV or device screens screens , consider black out curtains or white noise machines) Do not drive if sleepy. Remember to clean mask, tubing, filter, and reservoir once weekly with soapy water.  Follow up with Dr. Elsworth Soho or Judson Roch NP   In 4 months  or before as needed.     Tobacco Abuse Current every day smoker Plan I have spent 3 minutes counseling patient on smoking cessation this visit. Patient verbalizes understanding that continued smoking has  negative health consequences including worsening of COPD, risk of lung cancer , stroke and heart disease..    Follow Up Instructions: Follow up with Dr. Elsworth Soho or Judson Roch NP   In 4 months  or before as needed.     I discussed the assessment and treatment plan with the patient. The patient was provided an opportunity to ask questions and all were answered. The patient agreed with the plan and demonstrated an understanding of the instructions.   The patient was advised to call back or seek an in-person evaluation if the symptoms worsen or if the condition fails to improve as anticipated.  I provided 25 minutes of non-face-to-face time during this encounter.   Magdalen Spatz, NP  10/16/2019

## 2019-10-24 ENCOUNTER — Other Ambulatory Visit: Payer: Self-pay | Admitting: Internal Medicine

## 2019-10-24 DIAGNOSIS — E785 Hyperlipidemia, unspecified: Secondary | ICD-10-CM

## 2019-10-27 ENCOUNTER — Other Ambulatory Visit: Payer: Self-pay

## 2019-10-27 ENCOUNTER — Telehealth: Payer: Self-pay | Admitting: Family Medicine

## 2019-10-27 DIAGNOSIS — E785 Hyperlipidemia, unspecified: Secondary | ICD-10-CM

## 2019-10-27 MED ORDER — ATORVASTATIN CALCIUM 40 MG PO TABS: ORAL_TABLET | ORAL | 0 refills | Status: DC

## 2019-10-30 NOTE — Telephone Encounter (Signed)
Sent!

## 2019-10-30 NOTE — Telephone Encounter (Signed)
Sent to NP and RMA 

## 2019-11-16 ENCOUNTER — Telehealth: Payer: Self-pay | Admitting: Pulmonary Disease

## 2019-11-16 MED ORDER — PREDNISONE 10 MG PO TABS
ORAL_TABLET | ORAL | 0 refills | Status: DC
Start: 2019-11-16 — End: 2020-03-28

## 2019-11-16 NOTE — Telephone Encounter (Signed)
Spoke with the pt  He is c/o cough- non prod, chest congestion, and chest tightness x 2 days  He denies any fever, chills, sweats, CP  He states that he is needing pred taper  He is still taking his atrvovent and pulmicort as scheduled   RA not listed on Qgenda so sending to APP of the day  Please advise, thanks!

## 2019-11-16 NOTE — Telephone Encounter (Signed)
Prednisone taper over next week  If symptoms are not improving will need in person ov for evaluation   ? Had Covid vaccine.  Call back if develops discolored mucus   Monitor BS closely call if >250.   mucinex As needed  Cough/congestion  Albuterol As needed    Please contact office for sooner follow up if symptoms do not improve or worsen or seek emergency care    Prednisone 10mg  -4 tabs for 2 days, then 3 tabs for 2 days, 2 tabs for 2 days, then 1 tab for 2 days, then stop  #20 , no refills

## 2019-11-16 NOTE — Telephone Encounter (Signed)
Spoke with pt. He is aware of Tammy's response. Pt has had his COVID vaccines, they were done in March and are documented in his chart. Rx has been sent in. Nothing further was needed.

## 2019-12-26 ENCOUNTER — Other Ambulatory Visit: Payer: Self-pay | Admitting: Primary Care

## 2019-12-26 ENCOUNTER — Other Ambulatory Visit: Payer: Self-pay | Admitting: Pulmonary Disease

## 2019-12-26 DIAGNOSIS — J449 Chronic obstructive pulmonary disease, unspecified: Secondary | ICD-10-CM

## 2020-01-22 ENCOUNTER — Other Ambulatory Visit: Payer: Self-pay | Admitting: Family Medicine

## 2020-01-22 ENCOUNTER — Telehealth: Payer: Self-pay | Admitting: Pulmonary Disease

## 2020-01-22 ENCOUNTER — Other Ambulatory Visit: Payer: Self-pay | Admitting: Pulmonary Disease

## 2020-01-22 DIAGNOSIS — J449 Chronic obstructive pulmonary disease, unspecified: Secondary | ICD-10-CM

## 2020-01-22 DIAGNOSIS — E785 Hyperlipidemia, unspecified: Secondary | ICD-10-CM

## 2020-01-22 NOTE — Telephone Encounter (Signed)
This medication was sent earlier today. Wife contacted to confirm preferred pharmacy.

## 2020-01-23 ENCOUNTER — Telehealth: Payer: Self-pay | Admitting: Family Medicine

## 2020-01-26 ENCOUNTER — Other Ambulatory Visit: Payer: Self-pay | Admitting: Family Medicine

## 2020-01-26 DIAGNOSIS — E785 Hyperlipidemia, unspecified: Secondary | ICD-10-CM

## 2020-01-26 MED ORDER — ATORVASTATIN CALCIUM 40 MG PO TABS: ORAL_TABLET | ORAL | 3 refills | Status: DC

## 2020-01-26 NOTE — Telephone Encounter (Signed)
Sent to NP and CMA 

## 2020-02-06 NOTE — Telephone Encounter (Signed)
Pt stated  he picked up his Medication from pharmacy already.

## 2020-02-09 ENCOUNTER — Ambulatory Visit (INDEPENDENT_AMBULATORY_CARE_PROVIDER_SITE_OTHER): Payer: Medicare HMO | Admitting: Family Medicine

## 2020-02-09 ENCOUNTER — Encounter: Payer: Self-pay | Admitting: Family Medicine

## 2020-02-09 ENCOUNTER — Other Ambulatory Visit: Payer: Self-pay

## 2020-02-09 VITALS — BP 117/50 | HR 67 | Temp 98.7°F | Resp 16 | Ht 70.0 in | Wt 388.0 lb

## 2020-02-09 DIAGNOSIS — F419 Anxiety disorder, unspecified: Secondary | ICD-10-CM | POA: Diagnosis not present

## 2020-02-09 DIAGNOSIS — E66813 Obesity, class 3: Secondary | ICD-10-CM

## 2020-02-09 DIAGNOSIS — G4733 Obstructive sleep apnea (adult) (pediatric): Secondary | ICD-10-CM

## 2020-02-09 DIAGNOSIS — Z6841 Body Mass Index (BMI) 40.0 and over, adult: Secondary | ICD-10-CM

## 2020-02-09 DIAGNOSIS — J449 Chronic obstructive pulmonary disease, unspecified: Secondary | ICD-10-CM | POA: Diagnosis not present

## 2020-02-09 DIAGNOSIS — E119 Type 2 diabetes mellitus without complications: Secondary | ICD-10-CM

## 2020-02-09 DIAGNOSIS — R0602 Shortness of breath: Secondary | ICD-10-CM

## 2020-02-09 DIAGNOSIS — Z09 Encounter for follow-up examination after completed treatment for conditions other than malignant neoplasm: Secondary | ICD-10-CM

## 2020-02-09 DIAGNOSIS — Z9989 Dependence on other enabling machines and devices: Secondary | ICD-10-CM

## 2020-02-09 DIAGNOSIS — G4700 Insomnia, unspecified: Secondary | ICD-10-CM

## 2020-02-09 DIAGNOSIS — H3530 Unspecified macular degeneration: Secondary | ICD-10-CM

## 2020-02-09 LAB — POCT URINALYSIS DIPSTICK
Bilirubin, UA: NEGATIVE
Blood, UA: NEGATIVE
Glucose, UA: NEGATIVE
Ketones, UA: NEGATIVE
Nitrite, UA: NEGATIVE
Protein, UA: POSITIVE — AB
Spec Grav, UA: >=1.03 — AB (ref 1.010–1.025)
Urobilinogen, UA: 0.2 E.U./dL
pH, UA: 5.5 (ref 5.0–8.0)

## 2020-02-09 LAB — GLUCOSE, POCT (MANUAL RESULT ENTRY): POC Glucose: 147 mg/dl — AB (ref 70–99)

## 2020-02-09 LAB — POCT GLYCOSYLATED HEMOGLOBIN (HGB A1C)
HbA1c POC (<> result, manual entry): 6.3 % (ref 4.0–5.6)
HbA1c, POC (controlled diabetic range): 6.3 % (ref 0.0–7.0)
HbA1c, POC (prediabetic range): 6.3 % (ref 5.7–6.4)
Hemoglobin A1C: 6.3 % — AB (ref 4.0–5.6)

## 2020-02-09 MED ORDER — TRAZODONE HCL 100 MG PO TABS: 100.0000 mg | ORAL_TABLET | Freq: Every evening | ORAL | 6 refills | Status: DC | PRN

## 2020-02-09 NOTE — Progress Notes (Signed)
[facility]  Established Patient Office Visit  Subjective:  Patient ID: Timothy Thomas, male    DOB: 1965/10/12  Age: 54 y.o. MRN: [id number]  CC:  Chief Complaint  Patient presents with  . Follow-up    Pt states he having problems staying asleep. wakes up 10-15 times a night.  . Muscle Pain    Pt states he coughed and seprated his muscle from the rib cage.x2 yrs ago.    HPI Timothy Thomas is a 54 year old male who presents for Follow Up today.    Patient Active Problem List   Diagnosis Date Noted  . Shortness of breath 08/13/2019  . Type 2 diabetes mellitus without complication, without long-term current use of insulin (HCC) 08/13/2019  . Macular degeneration of both eyes 08/13/2019  . Diastolic dysfunction 08/27/2017  . Tobacco abuse 06/09/2017  . Pulmonary nodule 06/09/2017  . Chronic cough 05/28/2017  . OSA on CPAP 05/28/2017  . Chronic obstructive pulmonary disease (HCC) 03/25/2017  . Syncope 09/26/2016  . Morbid obesity (HCC) 09/26/2016  . Pulmonary embolism (HCC) 09/26/2016    Past Medical History:  Diagnosis Date  . Aphasia    Transient - negative head CT and EEG with neurology workup February 2017 [facility])  . COPD (chronic obstructive pulmonary disease) (HCC)   . Glaucoma   . Macular degeneration   . Myocardial infarction (HCC)   . Obesity   . Obstructive sleep apnea   . Sleep apnea   . Tachycardia-bradycardia syndrome (HCC)    Status post pacemaker  . Urinary retention 08/2019  . Variant angina (HCC)    History of normal coronary arteries at cardiac catheterization 2002 - vasospasm noted  . Vasovagal syncope     Current Status: Since his last office visit, he is doing well with no complaints. He denies fevers, chills, fatigue, recent infections, weight loss, and night sweats. He has not had any headaches, visual changes, dizziness, and falls. No chest pain, heart palpitations, cough and  shortness of breath reported. Denies GI problems such as nausea, vomiting, diarrhea, and constipation. He has no reports of blood in stools, dysuria and hematuria. No depression or anxiety, and denies suicidal ideations, homicidal ideations, or auditory hallucinations. He is taking all medications as prescribed. He denies pain today.   Past Surgical History:  Procedure Laterality Date  . LEFT HEART CATH AND CORONARY ANGIOGRAPHY N/A 09/28/2016   Procedure: Left Heart Cath and Coronary Angiography;  Surgeon: Orpah Cobb, MD;  Location: [facility] INVAS[facility];  Service: Cardiovascular;  Laterality: N/A;  . PACEMAKER INSERTION      Family History  Problem Relation Age of Onset  . Hypertension Mother   . Diabetes Father   . Hypertension Father   . Stroke Father     Social History   Socioeconomic History  . Marital status: Married    Spouse name: Not on file  . Number of children: Not on file  . Years of education: Not on file  . Highest education level: Not on file  Occupational History  . Not on file  Tobacco Use  . Smoking status: Current Every Day Smoker    Packs/day: 0.25    Years: 40.00    Pack years: 10.00    Types: Cigarettes    Start date: 09/26/1976  . Smokeless tobacco: Never Used  . Tobacco comment: currently smoking 0.5ppd as of 07/29/2018  Vaping Use  . Vaping Use: Never used  Substance and Sexual Activity  . Alcohol use: No  . Drug use: No  . Sexual activity: Yes  Other Topics Concern  . Not on file  Social History Narrative  . Not on file   Social Determinants of Health   Financial Resource Strain:   . Difficulty of Paying Living Expenses: Not on file  Food Insecurity:   . Worried About Programme researcher, broadcasting/film/video in the Last Year: Not on file  . Ran Out of Food in the Last Year: Not on file  Transportation Needs:   . Lack of Transportation (Medical): Not on file  . Lack of Transportation (Non-Medical): Not on file  Physical Activity:   . Days of Exercise per  Week: Not on file  . Minutes of Exercise per Session: Not on file  Stress:   . Feeling of Stress : Not on file  Social Connections:   . Frequency of Communication with Friends and Family: Not on file  . Frequency of Social Gatherings with Friends and Family: Not on file  . Attends Religious Services: Not on file  . Active Member of Clubs or Organizations: Not on file  . Attends Banker Meetings: Not on file  . Marital Status: Not on file  Intimate Partner Violence:   . Fear of Current or Ex-Partner: Not on file  . Emotionally Abused: Not on file  . Physically Abused: Not on file  . Sexually Abused: Not on file    Outpatient Medications Prior to Visit  Medication Sig Dispense Refill  . albuterol (PROVENTIL) (2.5 MG/3ML) 0.083% nebulizer solution INHALE 3 ML VIA NEBULIZER EVERY 6 HOURS AS NEEDED 75 mL 3  . albuterol (VENTOLIN HFA) 108 (90 Base) MCG/ACT inhaler INHALE TWO PUFFS BY MOUTH EVERY 6 HOURS AS NEEDED FOR SHORTNESS OF BREATH AND FOR WHEEZING 18 g 3  . aspirin EC 81 MG tablet Take 81 mg by mouth daily.    Marland Kitchen atorvastatin (LIPITOR) 40 MG tablet TAKE ONE TABLET BY MOUTH EVERY EVENING AT 6PM 90 tablet 3  . budesonide (PULMICORT) 0.5 MG/2ML nebulizer solution Take 2 mLs (0.5 mg total) by nebulization 2 (two) times daily. 120 mL 12  . cetirizine (ZYRTEC) 10 MG tablet Take 10 mg by mouth daily.    . chlorpheniramine (CHLOR-TRIMETON) 4 MG tablet Take 4 mg by mouth 2 (two) times daily as needed for allergies.    . diphenhydramine-acetaminophen (TYLENOL PM) 25-500 MG TABS tablet Take 3 tablets by mouth at bedtime.     Tery Sanfilippo Sodium (COLACE PO) Take by mouth.    . furosemide (LASIX) 20 MG tablet 1-2 tabs daily for leg swelling 90 tablet 1  . ipratropium (ATROVENT) 0.02 % nebulizer solution TAKE 2.5 ML VIA NEBULIZER FOUR TIMES A DAY 300 mL 4  . metFORMIN (GLUCOPHAGE) 500 MG tablet Take 1 tablet (500 mg total) by mouth 2 (two) times daily with a meal. 180 tablet 3  .  omeprazole (PRILOSEC) 20 MG capsule TAKE ONE CAPSULE BY MOUTH DAILY 30 capsule 6  . predniSONE (DELTASONE) 10 MG tablet 4 tabs for 2 days, then 3 tabs for 2 days, 2 tabs for 2 days, then 1 tab for 2 days, then stop 20 tablet 0  . warfarin (COUMADIN) 1 MG tablet TAKE 4 TABLETS WITH ONE 5MG  TABLET DAILY TO TOTAL 9 MG DAILY    . warfarin (COUMADIN) 5 MG tablet Take 8 mg by mouth daily.      No facility-administered medications prior to visit.  Allergies  Allergen Reactions  . Codeine Nausea And Vomiting    Low tolerance to narcotics  . Ketorolac Nausea And Vomiting    Vomiting      ROS Review of Systems  Constitutional: Negative.   HENT: Negative.   Eyes: Negative.   Respiratory: Negative.   Cardiovascular: Negative.   Gastrointestinal: Negative.   Endocrine: Negative.   Genitourinary: Negative.   Musculoskeletal: Negative.   Skin: Negative.   Allergic/Immunologic: Negative.   Neurological: Positive for dizziness (occasional ) and headaches (occasional ).  Hematological: Negative.   Psychiatric/Behavioral: Negative.       Objective:    Physical Exam Vitals and nursing note reviewed.  Constitutional:      Appearance: Normal appearance. He is obese.  HENT:     Head: Normocephalic and atraumatic.     Nose: Nose normal.     Mouth/Throat:     Mouth: Mucous membranes are moist.     Pharynx: Oropharynx is clear.  Cardiovascular:     Rate and Rhythm: Normal rate and regular rhythm.     Pulses: Normal pulses.     Heart sounds: Normal heart sounds.  Pulmonary:     Effort: Pulmonary effort is normal.     Breath sounds: Normal breath sounds.  Abdominal:     General: Bowel sounds are normal. There is distension (obese).     Palpations: Abdomen is soft.  Musculoskeletal:        General: Normal range of motion.     Cervical back: Normal range of motion and neck supple.  Skin:    General: Skin is warm and dry.  Neurological:     General: No focal deficit present.      Mental Status: He is alert and oriented to person, place, and time.  Psychiatric:        Mood and Affect: Mood normal.        Behavior: Behavior normal.        Thought Content: Thought content normal.        Judgment: Judgment normal.     BP (!) 117/50 (BP Location: Right Arm, Patient Position: Sitting, Cuff Size: Large)   Pulse 67   Temp 98.7 F (37.1 C)   Resp 16   Ht 5\' 10"  (1.778 m)   Wt (!) 388 lb (176 kg)   SpO2 96%   BMI 55.67 kg/m  Wt Readings from Last 3 Encounters:  02/09/20 (!) 388 lb (176 kg)  08/09/19 (!) 379 lb (171.9 kg)  06/23/19 (!) 362 lb (164.2 kg)     Health Maintenance Due  Topic Date Due  . FOOT EXAM  Never done  . OPHTHALMOLOGY EXAM  Never done  . URINE MICROALBUMIN  Never done  . COLONOSCOPY  Never done  . INFLUENZA VACCINE  01/07/2020    There are no preventive care reminders to display for this patient.  Lab Results  Component Value Date   TSH 1.820 04/12/2019   Lab Results  Component Value Date   WBC 10.2 05/07/2017   HGB 12.8 (L) 05/07/2017   HCT 40.1 05/07/2017   MCV 88.3 05/07/2017   PLT 209 05/07/2017   Lab Results  Component Value Date   NA 142 04/12/2019   K 3.8 04/12/2019   CO2 22 04/12/2019   GLUCOSE 84 04/12/2019   BUN 7 04/12/2019   CREATININE 0.71 (L) 04/12/2019   BILITOT 0.3 04/12/2019   ALKPHOS 84 04/12/2019   AST 10 04/12/2019   ALT 15 04/12/2019  PROT 7.2 04/12/2019   ALBUMIN 4.5 04/12/2019   CALCIUM 9.4 04/12/2019   ANIONGAP 7 05/07/2017   GFR 115.49 08/25/2017   Lab Results  Component Value Date   CHOL 119 04/12/2019   Lab Results  Component Value Date   HDL 28 (L) 04/12/2019   Lab Results  Component Value Date   LDLCALC 61 04/12/2019   Lab Results  Component Value Date   TRIG 175 (H) 04/12/2019   Lab Results  Component Value Date   CHOLHDL 7.2 (H) 06/23/2018   Lab Results  Component Value Date   HGBA1C 6.3 (A) 02/09/2020   HGBA1C 6.3 02/09/2020   HGBA1C 6.3 02/09/2020   HGBA1C 6.3  02/09/2020   Assessment & Plan:   1. Type 2 diabetes mellitus without complication, without long-term current use of insulin (HCC) He will continue medication as prescribed, to decrease foods/beverages high in sugars and carbs and follow Heart Healthy or DASH diet. Increase physical activity to at least 30 minutes cardio exercise daily.  - Urinalysis Dipstick - POC Glucose (CBG) - POC HgB A1c - CBC with Differential - Comprehensive metabolic panel; Future - TSH - Lipid Panel - PSA - Vitamin D, 25-hydroxy - Vitamin B12  2. Anxiety Stable today.   3. Chronic obstructive pulmonary disease, unspecified COPD type (HCC)  4. OSA on CPAP  5. Shortness of breath  6. Macular degeneration of both eyes, unspecified type  7. Insomnia, unspecified type - traZODone (DESYREL) 100 MG tablet; Take 1 tablet (100 mg total) by mouth at bedtime as needed for sleep.  Dispense: 30 tablet; Refill: 6  8. Class 3 severe obesity due to excess calories with serious comorbidity and body mass index (BMI) of 50.0 to 59.9 in adult [facility]) Body mass index is 55.67 kg/m.  Goal BMI  is <30. Encouraged efforts to reduce weight include engaging in physical activity as tolerated with goal of 150 minutes per week. Improve dietary choices and eat a meal regimen consistent with a Mediterranean or DASH diet. Reduce simple carbohydrates. Do not skip meals and eat healthy snacks throughout the day to avoid over-eating at dinner. Set a goal weight loss that is achievable for you.  9. Follow up He will follow up in 6 months.    Meds ordered this encounter  Medications  . traZODone (DESYREL) 100 MG tablet    Sig: Take 1 tablet (100 mg total) by mouth at bedtime as needed for sleep.    Dispense:  30 tablet    Refill:  6   Orders Placed This Encounter  Procedures  . CBC with Differential  . Comprehensive metabolic panel  . TSH  . Lipid Panel  . PSA  . Vitamin D, 25-hydroxy  . Vitamin B12  . Urinalysis Dipstick    . POC Glucose (CBG)  . POC HgB A1c    Referral Orders  No referral(s) requested today    Raliegh Ip,  MSN, FNP-BC [facility] [address] [place], [place] [address] [phone] fax   Problem List Items Addressed This Visit      Respiratory   Chronic obstructive pulmonary disease (HCC)   OSA on CPAP     Endocrine   Type 2 diabetes mellitus without complication, without long-term current use of insulin (HCC) - Primary   Relevant Orders   Urinalysis Dipstick (Completed)   POC Glucose (CBG) (Completed)   POC HgB A1c (Completed)   CBC with  Differential   Comprehensive metabolic panel   TSH   Lipid Panel   PSA   Vitamin D, 25-hydroxy   Vitamin B12     Other   Macular degeneration of both eyes   Shortness of breath    Other Visit Diagnoses    Anxiety       Relevant Medications   traZODone (DESYREL) 100 MG tablet   Insomnia, unspecified type       Relevant Medications   traZODone (DESYREL) 100 MG tablet   Class 3 severe obesity due to excess calories with serious comorbidity and body mass index (BMI) of 50.0 to 59.9 in adult [facility])       Follow up          Meds ordered this encounter  Medications  . traZODone (DESYREL) 100 MG tablet    Sig: Take 1 tablet (100 mg total) by mouth at bedtime as needed for sleep.    Dispense:  30 tablet    Refill:  6    Follow-up: Return in about 1 year (around 02/08/2021).    Kallie Locks, FNP

## 2020-02-10 ENCOUNTER — Other Ambulatory Visit: Payer: Self-pay | Admitting: Family Medicine

## 2020-02-10 DIAGNOSIS — E559 Vitamin D deficiency, unspecified: Secondary | ICD-10-CM

## 2020-02-10 LAB — CBC WITH DIFFERENTIAL/PLATELET
Basophils Absolute: 0.1 10*3/uL (ref 0.0–0.2)
Basos: 1 %
EOS (ABSOLUTE): 0.5 10*3/uL — ABNORMAL HIGH (ref 0.0–0.4)
Eos: 6 %
Hematocrit: 41.1 % (ref 37.5–51.0)
Hemoglobin: 13.1 g/dL (ref 13.0–17.7)
Immature Grans (Abs): 0 10*3/uL (ref 0.0–0.1)
Immature Granulocytes: 1 %
Lymphocytes Absolute: 2.9 10*3/uL (ref 0.7–3.1)
Lymphs: 34 %
MCH: 27.2 pg (ref 26.6–33.0)
MCHC: 31.9 g/dL (ref 31.5–35.7)
MCV: 85 fL (ref 79–97)
Monocytes Absolute: 0.1 10*3/uL (ref 0.1–0.9)
Monocytes: 2 %
Neutrophils Absolute: 5 10*3/uL (ref 1.4–7.0)
Neutrophils: 56 %
Platelets: 298 10*3/uL (ref 150–450)
RBC: 4.82 x10E6/uL (ref 4.14–5.80)
RDW: 14.6 % (ref 11.6–15.4)
WBC: 8.7 10*3/uL (ref 3.4–10.8)

## 2020-02-10 LAB — LIPID PANEL
Chol/HDL Ratio: 5.7 ratio — ABNORMAL HIGH (ref 0.0–5.0)
Cholesterol, Total: 130 mg/dL (ref 100–199)
HDL: 23 mg/dL — ABNORMAL LOW (ref >39)
LDL Chol Calc (NIH): 58 mg/dL (ref 0–99)
Triglycerides: 311 mg/dL — ABNORMAL HIGH (ref 0–149)
VLDL Cholesterol Cal: 49 mg/dL — ABNORMAL HIGH (ref 5–40)

## 2020-02-10 LAB — VITAMIN B12: Vitamin B-12: 309 pg/mL (ref 232–1245)

## 2020-02-10 LAB — PSA: Prostate Specific Ag, Serum: 0.3 ng/mL (ref 0.0–4.0)

## 2020-02-10 LAB — VITAMIN D 25 HYDROXY (VIT D DEFICIENCY, FRACTURES): Vit D, 25-Hydroxy: 17.1 ng/mL — ABNORMAL LOW (ref 30.0–100.0)

## 2020-02-10 LAB — TSH: TSH: 2.21 u[IU]/mL (ref 0.450–4.500)

## 2020-02-10 MED ORDER — VITAMIN D (ERGOCALCIFEROL) 1.25 MG (50000 UNIT) PO CAPS: 50000.0000 [IU] | ORAL_CAPSULE | ORAL | 3 refills | Status: DC

## 2020-02-12 ENCOUNTER — Encounter: Payer: Self-pay | Admitting: Family Medicine

## 2020-02-13 ENCOUNTER — Telehealth: Payer: Self-pay | Admitting: Family Medicine

## 2020-02-13 NOTE — Telephone Encounter (Signed)
Would like to have a conversation about bitd2ergocal. Does not understand why he needs it

## 2020-02-14 NOTE — Progress Notes (Signed)
Pt was called @ 12:42 to discuss his lab results. Pt stated he understood and call back to schedule his f/u appt.

## 2020-02-18 ENCOUNTER — Other Ambulatory Visit: Payer: Self-pay | Admitting: Acute Care

## 2020-02-19 ENCOUNTER — Telehealth: Payer: Self-pay | Admitting: Pulmonary Disease

## 2020-02-19 ENCOUNTER — Other Ambulatory Visit: Payer: Self-pay | Admitting: Family Medicine

## 2020-02-19 ENCOUNTER — Telehealth: Payer: Self-pay | Admitting: Family Medicine

## 2020-02-19 DIAGNOSIS — R053 Chronic cough: Secondary | ICD-10-CM

## 2020-02-19 DIAGNOSIS — I5189 Other ill-defined heart diseases: Secondary | ICD-10-CM

## 2020-02-19 DIAGNOSIS — J449 Chronic obstructive pulmonary disease, unspecified: Secondary | ICD-10-CM

## 2020-02-19 MED ORDER — ALBUTEROL SULFATE (2.5 MG/3ML) 0.083% IN NEBU: INHALATION_SOLUTION | RESPIRATORY_TRACT | 3 refills | Status: DC

## 2020-02-19 MED ORDER — OMEPRAZOLE 20 MG PO CPDR: 20.0000 mg | DELAYED_RELEASE_CAPSULE | Freq: Every day | ORAL | 6 refills | Status: DC

## 2020-02-19 NOTE — Telephone Encounter (Signed)
Patient contacted, refill sent to preferred pharmacy. 

## 2020-02-20 ENCOUNTER — Other Ambulatory Visit: Payer: Self-pay | Admitting: Family Medicine

## 2020-02-20 DIAGNOSIS — I5189 Other ill-defined heart diseases: Secondary | ICD-10-CM

## 2020-02-20 MED ORDER — FUROSEMIDE 20 MG PO TABS: ORAL_TABLET | ORAL | 1 refills | Status: DC

## 2020-02-20 NOTE — Telephone Encounter (Signed)
Error

## 2020-02-23 ENCOUNTER — Other Ambulatory Visit: Payer: Self-pay | Admitting: Pulmonary Disease

## 2020-02-23 DIAGNOSIS — R053 Chronic cough: Secondary | ICD-10-CM

## 2020-02-23 NOTE — Telephone Encounter (Signed)
Sent to provider 

## 2020-03-26 ENCOUNTER — Telehealth: Payer: Self-pay | Admitting: Pulmonary Disease

## 2020-03-26 ENCOUNTER — Other Ambulatory Visit: Payer: Self-pay | Admitting: Acute Care

## 2020-03-26 ENCOUNTER — Other Ambulatory Visit: Payer: Self-pay | Admitting: Family Medicine

## 2020-03-26 ENCOUNTER — Other Ambulatory Visit: Payer: Self-pay | Admitting: Primary Care

## 2020-03-26 DIAGNOSIS — E119 Type 2 diabetes mellitus without complications: Secondary | ICD-10-CM

## 2020-03-26 NOTE — Telephone Encounter (Signed)
Spoke with Timothy Thomas and informed her that refills have been called in. Timothy Thomas stated understanding. Nothing further needed at this time.

## 2020-03-27 ENCOUNTER — Telehealth: Payer: Self-pay | Admitting: Family Medicine

## 2020-03-27 ENCOUNTER — Other Ambulatory Visit: Payer: Self-pay | Admitting: Family Medicine

## 2020-03-27 DIAGNOSIS — I5189 Other ill-defined heart diseases: Secondary | ICD-10-CM

## 2020-03-27 DIAGNOSIS — E119 Type 2 diabetes mellitus without complications: Secondary | ICD-10-CM

## 2020-03-27 MED ORDER — METFORMIN HCL 500 MG PO TABS: 500.0000 mg | ORAL_TABLET | Freq: Two times a day (BID) | ORAL | 3 refills | Status: DC

## 2020-03-27 MED ORDER — FUROSEMIDE 20 MG PO TABS: ORAL_TABLET | ORAL | 3 refills | Status: DC

## 2020-03-28 ENCOUNTER — Ambulatory Visit: Payer: Medicare HMO | Admitting: Pulmonary Disease

## 2020-03-28 ENCOUNTER — Encounter: Payer: Self-pay | Admitting: Pulmonary Disease

## 2020-03-28 ENCOUNTER — Other Ambulatory Visit: Payer: Self-pay

## 2020-03-28 VITALS — BP 130/80 | HR 103 | Ht 70.0 in | Wt 390.4 lb

## 2020-03-28 DIAGNOSIS — R053 Chronic cough: Secondary | ICD-10-CM | POA: Diagnosis not present

## 2020-03-28 DIAGNOSIS — Z23 Encounter for immunization: Secondary | ICD-10-CM

## 2020-03-28 DIAGNOSIS — G4733 Obstructive sleep apnea (adult) (pediatric): Secondary | ICD-10-CM | POA: Diagnosis not present

## 2020-03-28 DIAGNOSIS — R0602 Shortness of breath: Secondary | ICD-10-CM

## 2020-03-28 DIAGNOSIS — Z720 Tobacco use: Secondary | ICD-10-CM

## 2020-03-28 DIAGNOSIS — Z9989 Dependence on other enabling machines and devices: Secondary | ICD-10-CM

## 2020-03-28 NOTE — Assessment & Plan Note (Signed)
  You have to stop smoking! Try nicotine patch 21 mg/day and use nicotine lozenges for breakthrough urge to smoke  Flu shot today

## 2020-03-28 NOTE — Assessment & Plan Note (Signed)
CPAP download was reviewed which shows excellent compliance, good control of events on average pressure 17 cm on auto 12 to 18 cm with minimal leak CPAP is certainly helped improve his daytime somnolence and fatigue. He obtain another machine from his sister who absolutely cannot use it.  We will provide him with Prescription for auto CPAP 12 to 18 cm -so that you can set your other machine to the settings.

## 2020-03-28 NOTE — Progress Notes (Signed)
   Subjective:    Patient ID: Timothy Thomas, male    DOB: 30-Jan-1966, 54 y.o.   MRN: [id number]  HPI  27   yomorbidly obese smoker followed for chronic cough, PE, lung nodule,and COPD Has OSA on CPAP . 09/2016  pulmonary embolism RV/LV ratio 4.2 and a non-STEMI, Venous duplex was negative. He was discharged on Xarelto but  developed another segmental PE in the left lower lobe in 03/2017 while on Xarelto.  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 Follow-up    Pt still has complaints of SOB with exertion.   17-month follow-up visit. Dyspnea is at baseline.  He needs new connection for his nebulizer. He continues to have a chronic cough which he attributes to postnasal drip which is controlled by Zyrtec OTC.  He has been unable to get Chlor-Trimeton but has not really not had any cough flares. He is compliant with Lasix. No problems with CPAP mask or pressure.  He obtained another CPAP machine from his sister and would like me to send a prescription to adjust settings on this machine so that he can use it He continues to complain of pain under his left chest wall coughing, he attributes this to rib injury from cough which occurred 2 years ago and never subsided   Significant tests/ events reviewed  Spirometry12/2018 >>moderate restriction with ratio of 83, FEV1 of 70% and FVC of 65%.  CT Angioof 09/2016 shows right lower lobe 1.0 cm nodule that is stable on follow-up CT in 03/2017, left lower lobe nodule is calcified and noted to be present in the past  CT chest 05/2017 stale 27mm RLL nodule 03/2018 CT chest-stable right lower lobe nodule 6 x 5 mm groundglass, has been noted since 2018 and has been stable, calcified granuloma left lung  CT chest 03/2019 stable right lower lobe nodule  Sleep study/CPAP titration June 2019 severe sleep apnea AHI 99/an hour, SP O2 low 83%, optimal control  CPAP 13 cm H2O  Review of Systems neg for any significant sore throat, dysphagia, itching, sneezing, nasal congestion or excess/ purulent secretions, fever, chills, sweats, unintended wt loss, pleuritic or exertional cp, hempoptysis, orthopnea pnd or change in chronic leg swelling. Also denies presyncope, palpitations, heartburn, abdominal pain, nausea, vomiting, diarrhea or change in bowel or urinary habits, dysuria,hematuria, rash, arthralgias, visual complaints, headache, numbness weakness or ataxia.     Objective:   Physical Exam  Gen. Pleasant, obese, in no distress ENT - no lesions, no post nasal drip Neck: No JVD, no thyromegaly, no carotid bruits Lungs: no use of accessory muscles, no dullness to percussion, decreased without rales or rhonchi  Cardiovascular: Rhythm regular, heart sounds  normal, no murmurs or gallops, 1+ peripheral edema Musculoskeletal: No deformities, no cyanosis or clubbing , no tremors       Assessment & Plan:

## 2020-03-28 NOTE — Patient Instructions (Signed)
Prescription for nebulizer supplies. Prescription for auto CPAP 12 to 18 cm -so that you can set your other machine to the settings.  Okay to stop Chlor-Trimeton and use it only if you have a flareup of cough. You have to stop smoking! Try nicotine patch 21 mg/day and use nicotine lozenges for breakthrough urge to smoke  Flu shot today

## 2020-03-28 NOTE — Assessment & Plan Note (Signed)
PFTs have not shown airway obstruction but symptomatically improved with bronchodilators hence we will continue budesonide and albuterol/Atrovent nebs

## 2020-03-28 NOTE — Assessment & Plan Note (Signed)
Okay to stop Chlor-Trimeton and use it only if you have a flareup of cough. Continue Zyrtec

## 2020-03-29 ENCOUNTER — Other Ambulatory Visit: Payer: Self-pay | Admitting: Pulmonary Disease

## 2020-03-29 DIAGNOSIS — J449 Chronic obstructive pulmonary disease, unspecified: Secondary | ICD-10-CM

## 2020-03-29 NOTE — Telephone Encounter (Signed)
Sent to provider 

## 2020-04-22 ENCOUNTER — Telehealth: Payer: Self-pay | Admitting: Pulmonary Disease

## 2020-04-22 MED ORDER — PREDNISONE 10 MG PO TABS
ORAL_TABLET | ORAL | 0 refills | Status: DC
Start: 2020-04-22 — End: 2020-08-07

## 2020-04-22 NOTE — Telephone Encounter (Signed)
Prednisone 10 mg tabs  Take 2 tabs daily with food x 5ds, then 1 tab daily with food x 5ds then STOP  

## 2020-04-22 NOTE — Telephone Encounter (Signed)
Spoke with the pt and notified of response per Dr Vassie Loll  He verbalized understanding  Rx for pred was sent to pharm

## 2020-04-22 NOTE — Telephone Encounter (Signed)
Spoke with the pt  He is c/o increased cough and chest tightness for the past 4 days  His cough is non prod  He has noticed only a slight increase in his SOB  No f/c/s, body aches or other co's  He is using his albuterol inhaler and neb both at least once per day and still using atrovent and pulmicort nebs daily  Pt is asking for prednisone  Please advise, thanks!

## 2020-04-22 NOTE — Telephone Encounter (Signed)
Triage is sending to Dr. Vassie Loll.

## 2020-04-22 NOTE — Addendum Note (Signed)
Addended by: Christen Butter on: 04/22/2020 05:34 PM   Modules accepted: Orders

## 2020-04-23 ENCOUNTER — Other Ambulatory Visit: Payer: Self-pay | Admitting: Pulmonary Disease

## 2020-04-23 DIAGNOSIS — J449 Chronic obstructive pulmonary disease, unspecified: Secondary | ICD-10-CM

## 2020-04-26 ENCOUNTER — Telehealth: Payer: Self-pay | Admitting: Pulmonary Disease

## 2020-04-26 DIAGNOSIS — J449 Chronic obstructive pulmonary disease, unspecified: Secondary | ICD-10-CM

## 2020-04-26 MED ORDER — ALBUTEROL SULFATE (2.5 MG/3ML) 0.083% IN NEBU: INHALATION_SOLUTION | RESPIRATORY_TRACT | 3 refills | Status: DC

## 2020-04-26 NOTE — Telephone Encounter (Signed)
Rx for albuterol neb sol sent to preferred pharm  I spoke with the pt's spouse and notified that this was done  Nothing further needed

## 2020-07-10 ENCOUNTER — Other Ambulatory Visit: Payer: Self-pay | Admitting: Pulmonary Disease

## 2020-07-10 DIAGNOSIS — J449 Chronic obstructive pulmonary disease, unspecified: Secondary | ICD-10-CM

## 2020-07-16 ENCOUNTER — Other Ambulatory Visit: Payer: Self-pay | Admitting: Pulmonary Disease

## 2020-08-07 ENCOUNTER — Telehealth: Payer: Self-pay | Admitting: Pulmonary Disease

## 2020-08-07 MED ORDER — PREDNISONE 10 MG PO TABS: ORAL_TABLET | ORAL | 0 refills | Status: DC

## 2020-08-07 NOTE — Telephone Encounter (Signed)
I give him a prednisone prescription to keep handy for an emergency. OK to refill  Please ensure that he has appointment with APP for a 38-month follow-up

## 2020-08-07 NOTE — Telephone Encounter (Signed)
LMTCB

## 2020-08-07 NOTE — Telephone Encounter (Signed)
Stacy wife is returning phone call. Stacy phone number is [phone].

## 2020-08-07 NOTE — Telephone Encounter (Signed)
Spoke with patient's wife Kennyth Arnold. She stated that she was calling to get a refill on patient's prednisone. I looked at his medication list and did not see a maintenance dose of prednisone, just a prednisone taper that was sent in back in November 2021.   RA, please advise if he is supposed to be on a maintenance dose of prednisone. Thanks!

## 2020-08-07 NOTE — Telephone Encounter (Signed)
Spoke with patient's wife Kennyth Arnold. She is aware that RA has approved the prednisone taper. She verbalized understanding.   Nothing further needed.

## 2020-09-20 ENCOUNTER — Other Ambulatory Visit: Payer: Self-pay | Admitting: Pulmonary Disease

## 2020-09-20 DIAGNOSIS — R053 Chronic cough: Secondary | ICD-10-CM

## 2020-09-24 ENCOUNTER — Telehealth: Payer: Self-pay

## 2020-09-24 ENCOUNTER — Other Ambulatory Visit: Payer: Self-pay | Admitting: Family Medicine

## 2020-09-24 DIAGNOSIS — G4700 Insomnia, unspecified: Secondary | ICD-10-CM

## 2020-09-24 MED ORDER — TRAZODONE HCL 100 MG PO TABS: 100.0000 mg | ORAL_TABLET | Freq: Every evening | ORAL | 6 refills | Status: DC | PRN

## 2020-09-24 NOTE — Telephone Encounter (Signed)
Med Refill  trazodon 100 mg 1 each at bed time

## 2020-09-24 NOTE — Progress Notes (Signed)
Meds ordered this encounter  Medications  . traZODone (DESYREL) 100 MG tablet    Sig: Take 1 tablet (100 mg total) by mouth at bedtime as needed for sleep.    Dispense:  30 tablet    Refill:  6    Order Specific Question:   Supervising Provider    Answer:   Quentin Angst [[id number]]     [name]  APRN, MSN, FNP-C Patient Care [facility] [address] [place], [place] [address] [phone]

## 2020-10-02 ENCOUNTER — Encounter: Payer: Self-pay | Admitting: Nurse Practitioner

## 2020-10-02 ENCOUNTER — Telehealth (INDEPENDENT_AMBULATORY_CARE_PROVIDER_SITE_OTHER): Payer: Medicare HMO | Admitting: Nurse Practitioner

## 2020-10-02 VITALS — Wt 367.0 lb

## 2020-10-02 DIAGNOSIS — E785 Hyperlipidemia, unspecified: Secondary | ICD-10-CM

## 2020-10-02 DIAGNOSIS — E119 Type 2 diabetes mellitus without complications: Secondary | ICD-10-CM

## 2020-10-02 DIAGNOSIS — I5189 Other ill-defined heart diseases: Secondary | ICD-10-CM | POA: Diagnosis not present

## 2020-10-02 DIAGNOSIS — F4310 Post-traumatic stress disorder, unspecified: Secondary | ICD-10-CM

## 2020-10-02 DIAGNOSIS — Z7901 Long term (current) use of anticoagulants: Secondary | ICD-10-CM

## 2020-10-02 MED ORDER — FUROSEMIDE 20 MG PO TABS: ORAL_TABLET | ORAL | 3 refills | Status: DC

## 2020-10-02 MED ORDER — ATORVASTATIN CALCIUM 40 MG PO TABS: ORAL_TABLET | ORAL | 3 refills | Status: DC

## 2020-10-02 NOTE — Patient Instructions (Signed)
Diabetes Mellitus and Nutrition, Adult When you have diabetes, or diabetes mellitus, it is very important to have healthy eating habits because your blood sugar (glucose) levels are greatly affected by what you eat and drink. Eating healthy foods in the right amounts, at about the same times every day, can help you:  Control your blood glucose.  Lower your risk of heart disease.  Improve your blood pressure.  Reach or maintain a healthy weight. What can affect my meal plan? Every person with diabetes is different, and each person has different needs for a meal plan. Your health care provider may recommend that you work with a dietitian to make a meal plan that is best for you. Your meal plan may vary depending on factors such as:  The calories you need.  The medicines you take.  Your weight.  Your blood glucose, blood pressure, and cholesterol levels.  Your activity level.  Other health conditions you have, such as heart or kidney disease. How do carbohydrates affect me? Carbohydrates, also called carbs, affect your blood glucose level more than any other type of food. Eating carbs naturally raises the amount of glucose in your blood. Carb counting is a method for keeping track of how many carbs you eat. Counting carbs is important to keep your blood glucose at a healthy level, especially if you use insulin or take certain oral diabetes medicines. It is important to know how many carbs you can safely have in each meal. This is different for every person. Your dietitian can help you calculate how many carbs you should have at each meal and for each snack. How does alcohol affect me? Alcohol can cause a sudden decrease in blood glucose (hypoglycemia), especially if you use insulin or take certain oral diabetes medicines. Hypoglycemia can be a life-threatening condition. Symptoms of hypoglycemia, such as sleepiness, dizziness, and confusion, are similar to symptoms of having too much  alcohol.  Do not drink alcohol if: ? Your health care provider tells you not to drink. ? You are pregnant, may be pregnant, or are planning to become pregnant.  If you drink alcohol: ? Do not drink on an empty stomach. ? Limit how much you use to:  0-1 drink a day for women.  0-2 drinks a day for men. ? Be aware of how much alcohol is in your drink. In the [place] one drink equals one 12 oz bottle of beer (355 mL), one 5 oz glass of wine (148 mL), or one 1 oz glass of hard liquor (44 mL). ? Keep yourself hydrated with water, diet soda, or unsweetened iced tea.  Keep in mind that regular soda, juice, and other mixers may contain a lot of sugar and must be counted as carbs. What are tips for following this plan? Reading food labels  Start by checking the serving size on the "Nutrition Facts" label of packaged foods and drinks. The amount of calories, carbs, fats, and other nutrients listed on the label is based on one serving of the item. Many items contain more than one serving per package.  Check the total grams (g) of carbs in one serving. You can calculate the number of servings of carbs in one serving by dividing the total carbs by 15. For example, if a food has 30 g of total carbs per serving, it would be equal to 2 servings of carbs.  Check the number of grams (g) of saturated fats and trans fats in one serving. Choose foods that have   a low amount or none of these fats.  Check the number of milligrams (mg) of salt (sodium) in one serving. Most people should limit total sodium intake to less than 2,300 mg per day.  Always check the nutrition information of foods labeled as "low-fat" or "nonfat." These foods may be higher in added sugar or refined carbs and should be avoided.  Talk to your dietitian to identify your daily goals for nutrients listed on the label. Shopping  Avoid buying canned, pre-made, or processed foods. These foods tend to be high in fat, sodium, and added  sugar.  Shop around the outside edge of the grocery store. This is where you will most often find fresh fruits and vegetables, bulk grains, fresh meats, and fresh dairy. Cooking  Use low-heat cooking methods, such as baking, instead of high-heat cooking methods like deep frying.  Cook using healthy oils, such as olive, canola, or sunflower oil.  Avoid cooking with butter, cream, or high-fat meats. Meal planning  Eat meals and snacks regularly, preferably at the same times every day. Avoid going long periods of time without eating.  Eat foods that are high in fiber, such as fresh fruits, vegetables, beans, and whole grains. Talk with your dietitian about how many servings of carbs you can eat at each meal.  Eat 4-6 oz (112-168 g) of lean protein each day, such as lean meat, chicken, fish, eggs, or tofu. One ounce (oz) of lean protein is equal to: ? 1 oz (28 g) of meat, chicken, or fish. ? 1 egg. ?  cup (62 g) of tofu.  Eat some foods each day that contain healthy fats, such as avocado, nuts, seeds, and fish.   What foods should I eat? Fruits Berries. Apples. Oranges. Peaches. Apricots. Plums. Grapes. Mango. Papaya. Pomegranate. Kiwi. Cherries. Vegetables Lettuce. Spinach. Leafy greens, including kale, chard, collard greens, and mustard greens. Beets. Cauliflower. Cabbage. Broccoli. Carrots. Green beans. Tomatoes. Peppers. Onions. Cucumbers. Brussels sprouts. Grains Whole grains, such as whole-wheat or whole-grain bread, crackers, tortillas, cereal, and pasta. Unsweetened oatmeal. Quinoa. Brown or wild rice. Meats and other proteins Seafood. Poultry without skin. Lean cuts of poultry and beef. Tofu. Nuts. Seeds. Dairy Low-fat or fat-free dairy products such as milk, yogurt, and cheese. The items listed above may not be a complete list of foods and beverages you can eat. Contact a dietitian for more information. What foods should I avoid? Fruits Fruits canned with  syrup. Vegetables Canned vegetables. Frozen vegetables with butter or cream sauce. Grains Refined white flour and flour products such as bread, pasta, snack foods, and cereals. Avoid all processed foods. Meats and other proteins Fatty cuts of meat. Poultry with skin. Breaded or fried meats. Processed meat. Avoid saturated fats. Dairy Full-fat yogurt, cheese, or milk. Beverages Sweetened drinks, such as soda or iced tea. The items listed above may not be a complete list of foods and beverages you should avoid. Contact a dietitian for more information. Questions to ask a health care provider  Do I need to meet with a diabetes educator?  Do I need to meet with a dietitian?  What number can I call if I have questions?  When are the best times to check my blood glucose? Where to find more information:  American Diabetes Association: diabetes.org  Academy of Nutrition and Dietetics: www.eatright.org  National Institute of Diabetes and Digestive and Kidney Diseases: www.niddk.nih.gov  Association of Diabetes Care and Education Specialists: www.diabeteseducator.org Summary  It is important to have healthy eating   habits because your blood sugar (glucose) levels are greatly affected by what you eat and drink.  A healthy meal plan will help you control your blood glucose and maintain a healthy lifestyle.  Your health care provider may recommend that you work with a dietitian to make a meal plan that is best for you.  Keep in mind that carbohydrates (carbs) and alcohol have immediate effects on your blood glucose levels. It is important to count carbs and to use alcohol carefully. This information is not intended to replace advice given to you by your health care provider. Make sure you discuss any questions you have with your health care provider. Document Revised: 05/02/2019 Document Reviewed: 05/02/2019 Elsevier Patient Education  2021 Elsevier Inc.  
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
## 2020-11-18 ENCOUNTER — Telehealth: Payer: Self-pay | Admitting: Pulmonary Disease

## 2020-11-18 NOTE — Telephone Encounter (Signed)
Primary Pulmonologist: Dr Vassie Loll Last office visit and with whom:  What do we see them for (pulmonary problems): 03/28/2020 Last OV assessment/plan:  Assessment & Plan Note by Oretha Milch, MD at 03/28/2020 2:20 PM  Author: Oretha Milch, MD Author Type: Physician Filed: 03/28/2020  2:21 PM  Note Status: Written Cosign: Cosign Not Required Encounter Date: 03/28/2020  Problem: OSA on CPAP  Editor: Oretha Milch, MD (Physician)             CPAP download was reviewed which shows excellent compliance, good control of events on average pressure 17 cm on auto 12 to 18 cm with minimal leak CPAP is certainly helped improve his daytime somnolence and fatigue. He obtain another machine from his sister who absolutely cannot use it.  We will provide him with Prescription for auto CPAP 12 to 18 cm -so that you can set your other machine to the settings.        Assessment & Plan Note by Oretha Milch, MD at 03/28/2020 2:20 PM  Author: Oretha Milch, MD Author Type: Physician Filed: 03/28/2020  2:20 PM  Note Status: Written Cosign: Cosign Not Required Encounter Date: 03/28/2020  Problem: Shortness of breath  Editor: Oretha Milch, MD (Physician)             PFTs have not shown airway obstruction but symptomatically improved with bronchodilators hence we will continue budesonide and albuterol/Atrovent nebs        Assessment & Plan Note by Oretha Milch, MD at 03/28/2020 2:19 PM  Author: Oretha Milch, MD Author Type: Physician Filed: 03/28/2020  2:20 PM  Note Status: Written Cosign: Cosign Not Required Encounter Date: 03/28/2020  Problem: Chronic cough  Editor: Oretha Milch, MD (Physician)             Okay to stop Chlor-Trimeton and use it only if you have a flareup of cough. Continue Zyrtec        Assessment & Plan Note by Oretha Milch, MD at 03/28/2020 2:19 PM  Author: Oretha Milch, MD Author Type: Physician Filed: 03/28/2020  2:19 PM  Note Status: Written Cosign: Cosign  Not Required Encounter Date: 03/28/2020  Problem: Tobacco abuse  Editor: Oretha Milch, MD (Physician)               You have to stop smoking! Try nicotine patch 21 mg/day and use nicotine lozenges for breakthrough urge to smoke   Flu shot today        Patient Instructions by Oretha Milch, MD at 03/28/2020 1:45 PM  Author: Oretha Milch, MD Author Type: Physician Filed: 03/28/2020  2:19 PM  Note Status: Signed Cosign: Cosign Not Required Encounter Date: 03/28/2020  Editor: Oretha Milch, MD (Physician)             Prescription for nebulizer supplies. Prescription for auto CPAP 12 to 18 cm -so that you can set your other machine to the settings.   Okay to stop Chlor-Trimeton and use it only if you have a flareup of cough. You have to stop smoking! Try nicotine patch 21 mg/day and use nicotine lozenges for breakthrough urge to smoke   Flu shot today         Instructions   Return in about 4 months (around 07/29/2020) for [place] office. Prescription for nebulizer supplies. Prescription for auto CPAP 12 to 18 cm -so that you can set your other machine to the settings.   Okay  to stop Chlor-Trimeton and use it only if you have a flareup of         Reason for call: Called and spoke with patient who stated he started wheezing and non productive cough on Saturday night and used his Albuterol neb treatments 7 times. Patient states when he get's a flare up like this Dr Vassie Loll prescribes the prn prednisone 10mg  total of 15 tablets for 5 days (Per patient). Patient denies any other symptoms such as shortness of breath, fever.   Pharmacy is in [place].  (examples of things to ask: : When did symptoms start? Fever? Cough? Productive? Color to sputum? More sputum than usual? Wheezing? Have you needed increased oxygen? Are you taking your respiratory medications? What over the counter measures have you tried?)  Allergies  Allergen Reactions   Codeine Nausea And  Vomiting    Low tolerance to narcotics   Ketorolac Nausea And Vomiting    Vomiting      Immunization History  Administered Date(s) Administered   Influenza,inj,Quad PF,6+ Mos 02/09/2017, 04/06/2018, 03/29/2019, 03/28/2020   PFIZER(Purple Top)SARS-COV-2 Vaccination 08/18/2019, 09/06/2019   Pneumococcal Polysaccharide-23 10/02/2016   Tdap 12/22/2018

## 2020-11-18 NOTE — Telephone Encounter (Signed)
Called and spoke with patient. Advised him of Tammy's recs. Patient states he will take a home COVID test and call us tomorrow if positive. I did advised patient that the home test can show a false negative and advised that he may want to get a PCR test done. Patient states he was outside while grass was being mowed and that is what flared everything up.  Nothing further needed at this time.

## 2020-11-18 NOTE — Telephone Encounter (Signed)
Patient needs to test for COVID-19 call if positive Last seen October 2022 He does need an office visit for further evaluation if COVID test is negative.  May set up for a video visit for further evaluation and treatment options  Please contact office for sooner follow up if symptoms do not improve or worsen or seek emergency care

## 2020-11-19 ENCOUNTER — Other Ambulatory Visit: Payer: Self-pay | Admitting: Pulmonary Disease

## 2020-11-19 ENCOUNTER — Other Ambulatory Visit: Payer: Self-pay

## 2020-11-19 NOTE — Progress Notes (Signed)
Erroneous encounter

## 2020-11-21 ENCOUNTER — Telehealth: Payer: Self-pay | Admitting: Pulmonary Disease

## 2020-11-21 ENCOUNTER — Other Ambulatory Visit: Payer: Self-pay

## 2020-11-21 MED ORDER — ALBUTEROL SULFATE HFA 108 (90 BASE) MCG/ACT IN AERS: INHALATION_SPRAY | RESPIRATORY_TRACT | 3 refills | Status: DC

## 2020-11-21 NOTE — Telephone Encounter (Signed)
Called and spoke with patient. Advised that Albuterol was sent into the pharmacy. Patient states he took 2 negative test per Tammy's request. Patient states he has a follow appointment scheduled.   Nothing further needed at this time.

## 2020-11-21 NOTE — Progress Notes (Signed)
Refill sent in to pharmacy 

## 2020-11-28 ENCOUNTER — Telehealth: Payer: Self-pay | Admitting: Pulmonary Disease

## 2020-11-28 NOTE — Telephone Encounter (Signed)
I have attempted to call the pt on the number given but was sent to VM.  Unable to leave a VM as the VM is full and cannot accept any messages.  Will try again in the morning.

## 2020-11-28 NOTE — Telephone Encounter (Signed)
Primary Pulmonologist: Vassie Loll Last office visit and with whom: 03/28/2010 Vassie Loll What do we see them for (pulmonary problems): chronic cough, sob Last OV assessment/plan:  Assessment & Plan:          Assessment & Plan Note by Oretha Milch, MD at 03/28/2020 2:20 PM  Author: Oretha Milch, MD Author Type: Physician Filed: 03/28/2020  2:21 PM  Note Status: Written Cosign: Cosign Not Required Encounter Date: 03/28/2020  Problem: OSA on CPAP  Editor: Oretha Milch, MD (Physician)               CPAP download was reviewed which shows excellent compliance, good control of events on average pressure 17 cm on auto 12 to 18 cm with minimal leak CPAP is certainly helped improve his daytime somnolence and fatigue. He obtain another machine from his sister who absolutely cannot use it.  We will provide him with Prescription for auto CPAP 12 to 18 cm -so that you can set your other machine to the settings.         Assessment & Plan Note by Oretha Milch, MD at 03/28/2020 2:20 PM  Author: Oretha Milch, MD Author Type: Physician Filed: 03/28/2020  2:20 PM  Note Status: Written Cosign: Cosign Not Required Encounter Date: 03/28/2020  Problem: Shortness of breath  Editor: Oretha Milch, MD (Physician)               PFTs have not shown airway obstruction but symptomatically improved with bronchodilators hence we will continue budesonide and albuterol/Atrovent nebs         Assessment & Plan Note by Oretha Milch, MD at 03/28/2020 2:19 PM  Author: Oretha Milch, MD Author Type: Physician Filed: 03/28/2020  2:20 PM  Note Status: Written Cosign: Cosign Not Required Encounter Date: 03/28/2020  Problem: Chronic cough  Editor: Oretha Milch, MD (Physician)               Okay to stop Chlor-Trimeton and use it only if you have a flareup of cough. Continue Zyrtec         Assessment & Plan Note by Oretha Milch, MD at 03/28/2020 2:19 PM  Author: Oretha Milch, MD Author Type:  Physician Filed: 03/28/2020  2:19 PM  Note Status: Written Cosign: Cosign Not Required Encounter Date: 03/28/2020  Problem: Tobacco abuse  Editor: Oretha Milch, MD (Physician)                 You have to stop smoking! Try nicotine patch 21 mg/day and use nicotine lozenges for breakthrough urge to smoke   Flu shot today         Patient Instructions by Oretha Milch, MD at 03/28/2020 1:45 PM  Author: Oretha Milch, MD Author Type: Physician Filed: 03/28/2020  2:19 PM  Note Status: Signed Cosign: Cosign Not Required Encounter Date: 03/28/2020  Editor: Oretha Milch, MD (Physician)               Prescription for nebulizer supplies. Prescription for auto CPAP 12 to 18 cm -so that you can set your other machine to the settings.   Okay to stop Chlor-Trimeton and use it only if you have a flareup of cough. You have to stop smoking! Try nicotine patch 21 mg/day and use nicotine lozenges for breakthrough urge to smoke   Flu shot today         Instructions    Return in about 4 months (around 07/29/2020)  for [place] office.  Prescription for nebulizer supplies. Prescription for auto CPAP 12 to 18 cm -so that you can set your other machine to the settings.   Okay to stop Chlor-Trimeton and use it only if you have a flareup of cough. You have to stop smoking! Try nicotine patch 21 mg/day and use nicotine lozenges for breakthrough urge to smoke   Flu shot today        Reason for call: On June 4th people around him were cutting grass and hay and he was outside around it, he began having a dry cough and chest tightness.  It is progressively been getting worse.  He called in previously and was told to get covid tested, which he did and it was negative, he has since then taken another covid test and it was also negative.  He was told he would have to wait until July 28th to see Dr. Vassie Loll in [place] as that was his first available.  He is using his albuterol nebulizer  every 4 hours with little relief, budesonide neb every 12 hours with little relief and the Iptropium neb every 5 hours with little relief.  He does not want to end up going to the hospital.  He is requesting some prednisone to help his breathing and cough.  He has tried otc robitussin, dayquil and nightquil with no relief.  Dr. Vassie Loll, please advise.  Thank you.  (examples of things to ask: : When did symptoms start? Fever? Cough? Productive? Color to sputum? More sputum than usual? Wheezing? Have you needed increased oxygen? Are you taking your respiratory medications? What over the counter measures have you tried?)  Allergies  Allergen Reactions   Codeine Nausea And Vomiting    Low tolerance to narcotics   Ketorolac Nausea And Vomiting    Vomiting      Immunization History  Administered Date(s) Administered   Influenza,inj,Quad PF,6+ Mos 02/09/2017, 04/06/2018, 03/29/2019, 03/28/2020   PFIZER(Purple Top)SARS-COV-2 Vaccination 08/18/2019, 09/06/2019   Pneumococcal Polysaccharide-23 10/02/2016   Tdap 12/22/2018

## 2020-11-28 NOTE — Telephone Encounter (Signed)
ATC Patient.  No answer and unable to leave message, VM is currently full. Will try again at a later time today.

## 2020-11-29 MED ORDER — PREDNISONE 10 MG PO TABS: ORAL_TABLET | ORAL | 0 refills | Status: AC

## 2020-11-29 NOTE — Telephone Encounter (Signed)
Called [phone], the patient's wife stacy answered the phone, however, he does not have a DPR on file with [place] and I was unable to speak with her.  I advised the patient of the recommendations per Dr. Vassie Loll and that when he comes in for his next visit to ask to fill out a DPR and add his wife to is.  He verbalized understanding.  Verified pharmacy and prednisone sent to pharmacy.  Nothing further needed.

## 2021-01-01 ENCOUNTER — Other Ambulatory Visit: Payer: Self-pay

## 2021-01-01 ENCOUNTER — Encounter: Payer: Self-pay | Admitting: Pulmonary Disease

## 2021-01-01 ENCOUNTER — Ambulatory Visit: Payer: Medicare HMO | Admitting: Pulmonary Disease

## 2021-01-01 VITALS — BP 130/70 | HR 87 | Temp 97.6°F | Ht 70.0 in | Wt 349.0 lb

## 2021-01-01 DIAGNOSIS — J449 Chronic obstructive pulmonary disease, unspecified: Secondary | ICD-10-CM

## 2021-01-01 DIAGNOSIS — Z9989 Dependence on other enabling machines and devices: Secondary | ICD-10-CM | POA: Diagnosis not present

## 2021-01-01 DIAGNOSIS — G4733 Obstructive sleep apnea (adult) (pediatric): Secondary | ICD-10-CM | POA: Diagnosis not present

## 2021-01-01 MED ORDER — TRELEGY ELLIPTA 200-62.5-25 MCG/INH IN AEPB: 1.0000 | INHALATION_SPRAY | Freq: Every day | RESPIRATORY_TRACT | 0 refills | Status: DC

## 2021-01-01 MED ORDER — PREDNISONE 10 MG PO TABS: ORAL_TABLET | ORAL | 0 refills | Status: AC

## 2021-01-01 NOTE — Patient Instructions (Addendum)
  Sample of Trelegy 200 - rinse mouth after use Take instead of budesonide &  atrovent  OK to use albuterol  If it works, call for Rx  Rx to [facility] for neb & CPAP supplies  - on auto CPAP 12-18 cm  Rx for pred taper to pharmacy  Blood work today - CBC + diff, RAST

## 2021-01-01 NOTE — Assessment & Plan Note (Signed)
Auto CPAP download was reviewed which shows excellent control of events on 16 cm with maximum pressure of 17 cm with good compliance and minimal leak CPAP is certainly helped improve his daytime somnolence and fatigue. CPAP supplies will be renewed for a year  Weight loss encouraged, compliance with goal of at least 4-6 hrs every night is the expectation. Advised against medications with sedative side effects Cautioned against driving when sleepy - understanding that sleepiness will vary on a day to day basis

## 2021-01-01 NOTE — Assessment & Plan Note (Signed)
Surprisingly PFTs have not shown airway obstruction but is recurrent exacerbations especially triggers being smells and dust exposure seem to resemble asthma rather than COPD.  He also responds very well to steroid tapers, overall seems to have asthma physiology  Sample of Trelegy 200 - rinse mouth after use Take instead of budesonide &  atrovent  OK to use albuterol  If it works, call for Rx  We will send in prescription to DME for nebulizer supplies  Rx for pred taper to pharmacy -we discussed self administration of prednisone  Blood work today - CBC + diff, RAST We will consider Biologics in the future if does not respond to Trelegy

## 2021-01-01 NOTE — Progress Notes (Signed)
Subjective:    Patient ID: Timothy Thomas, male    DOB: 25-Jan-1966, 55 y.o.   MRN: [id number]  HPI  55 yo morbidly obese smoker followed for chronic cough, lung nodule, and COPD Has OSA on CPAP .  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  Relates this to somebody moving and dust in the air.  He had similar flareups requiring prednisone in 3/20 [place] 04/2020 on review of about 3 flares in the last 9 months.  He has been compliant with his regimen of budesonide, albuterol and Atrovent nebs.  In the past other LABA/LABA combinations have not worked for him. He is compliant with his CPAP and does not have any issues with mask or pressure.  He needs new prescription sent to DME Wife accompanies who corroborates history He continues to smoke half pack per day   Significant tests/ events reviewed  Eos 02/2020 500 Alpha 1 07/2018 164  Spirometry 05/2017 >>moderate restriction with ratio of 83, FEV1 of 70% and FVC of 65%.   CT Angio of 09/2016 shows right lower lobe 1.0 cm nodule that is stable on follow-up CT in 03/2017, left lower lobe nodule is calcified and noted to be present in the past   CT chest 05/2017 stale 63mm RLL nodule 03/2018 CT chest- stable right lower lobe nodule 6 x 5 mm groundglass, has been noted since 2018 and has been stable, calcified granuloma left lung   CT chest 03/2019 stable right lower lobe nodule   CPAP titration 11/2017 severe sleep apnea AHI 99/an hour, SP O2 low 83%, optimal control CPAP 13 cm H2O  Past Medical History:  Diagnosis Date   Aphasia    Transient - negative head CT and EEG with neurology workup February  2017 [facility])   COPD (chronic obstructive pulmonary disease) (HCC)    Glaucoma    Macular degeneration    Myocardial infarction (HCC)    Obesity    Obstructive sleep apnea    Sleep apnea    Tachycardia-bradycardia syndrome (HCC)    Status post pacemaker   Urinary retention 08/2019   Variant angina (HCC)    History of normal coronary arteries at cardiac catheterization 2002 - vasospasm noted   Vasovagal syncope      Review of Systems neg for any significant sore throat, dysphagia, itching, sneezing, nasal congestion or excess/ purulent secretions, fever, chills, sweats, unintended wt loss, pleuritic or exertional cp, hempoptysis, orthopnea pnd or change in chronic leg swelling. Also denies presyncope, palpitations, heartburn, abdominal pain, nausea, vomiting, diarrhea or change in bowel or urinary habits, dysuria,hematuria, rash, arthralgias, visual complaints, headache, numbness weakness or ataxia.     Objective:   Physical Exam  Gen. Pleasant, obese, in no distress, normal affect ENT - no pallor,icterus, no post nasal drip, class 2-3 airway Neck: No JVD, no thyromegaly, no carotid bruits Lungs: no use of accessory muscles, no dullness to percussion, decreased without rales or rhonchi  Cardiovascular: Rhythm regular, heart sounds  normal, no murmurs or gallops, no peripheral edema Abdomen: soft and non-tender, no hepatosplenomegaly, BS normal. Musculoskeletal: No deformities, no cyanosis or clubbing Neuro:  alert,  non focal, no tremors       Assessment & Plan:

## 2021-01-04 LAB — CBC WITH DIFFERENTIAL/PLATELET
Basophils Absolute: 0.1 10*3/uL (ref 0.0–0.2)
Basos: 1 %
EOS (ABSOLUTE): 0.4 10*3/uL (ref 0.0–0.4)
Eos: 4 %
Hematocrit: 40.7 % (ref 37.5–51.0)
Hemoglobin: 12.8 g/dL — ABNORMAL LOW (ref 13.0–17.7)
Immature Grans (Abs): 0.1 10*3/uL (ref 0.0–0.1)
Immature Granulocytes: 1 %
Lymphocytes Absolute: 2.2 10*3/uL (ref 0.7–3.1)
Lymphs: 22 %
MCH: 26.3 pg — ABNORMAL LOW (ref 26.6–33.0)
MCHC: 31.4 g/dL — ABNORMAL LOW (ref 31.5–35.7)
MCV: 84 fL (ref 79–97)
Monocytes Absolute: 0.6 10*3/uL (ref 0.1–0.9)
Monocytes: 6 %
Neutrophils Absolute: 6.5 10*3/uL (ref 1.4–7.0)
Neutrophils: 66 %
Platelets: 309 10*3/uL (ref 150–450)
RBC: 4.87 x10E6/uL (ref 4.14–5.80)
RDW: 15 % (ref 11.6–15.4)
WBC: 9.8 10*3/uL (ref 3.4–10.8)

## 2021-01-04 LAB — ALLERGENS W/TOTAL IGE AREA 2
Alternaria Alternata IgE: <0.1 kU/L
Aspergillus Fumigatus IgE: <0.1 kU/L
Bermuda Grass IgE: <0.1 kU/L
Cat Dander IgE: 0.16 kU/L — AB
Cedar, Mountain IgE: <0.1 kU/L
Cladosporium Herbarum IgE: <0.1 kU/L
Cockroach, German IgE: <0.1 kU/L
Common Silver Birch IgE: <0.1 kU/L
Cottonwood IgE: <0.1 kU/L
D Farinae IgE: <0.1 kU/L
D Pteronyssinus IgE: <0.1 kU/L
Dog Dander IgE: 0.31 kU/L — AB
Elm, American IgE: 0.15 kU/L — AB
IgE (Immunoglobulin E), Serum: 594 IU/mL — ABNORMAL HIGH (ref 6–495)
Johnson Grass IgE: <0.1 kU/L
Maple/Box Elder IgE: 0.11 kU/L — AB
Mouse Urine IgE: <0.1 kU/L
Oak, White IgE: <0.1 kU/L
Pecan, Hickory IgE: <0.1 kU/L
Penicillium Chrysogen IgE: <0.1 kU/L
Pigweed, Rough IgE: <0.1 kU/L
Ragweed, Short IgE: <0.1 kU/L
Sheep Sorrel IgE Qn: 0.12 kU/L — AB
Timothy Grass IgE: <0.1 kU/L
White Mulberry IgE: 0.1 kU/L — AB

## 2021-01-09 ENCOUNTER — Telehealth: Payer: Self-pay | Admitting: Pulmonary Disease

## 2021-01-09 NOTE — Progress Notes (Signed)
Spoke with pt and notified of results per Dr. Alva. Pt verbalized understanding and denied any questions. 

## 2021-01-09 NOTE — Telephone Encounter (Signed)
Low grade to cat/dog dander & trees Slight high eosinophils may qualify him for injectable medications which we candiscuss in future depending on how many flares he has per year   Spoke with pt and notified of results per Dr. Vassie Loll. Pt verbalized understanding and denied any questions.

## 2021-01-17 ENCOUNTER — Telehealth: Payer: Self-pay | Admitting: Pulmonary Disease

## 2021-01-17 MED ORDER — TRELEGY ELLIPTA 200-62.5-25 MCG/INH IN AEPB: 1.0000 | INHALATION_SPRAY | Freq: Every day | RESPIRATORY_TRACT | 11 refills | Status: DC

## 2021-01-17 NOTE — Telephone Encounter (Signed)
Pt stated that the sample of Trelegy 200 has been working really well for him and he would like for a rx to be called in for him.  Pharmacy; [facility] - [place], [place] - [address]  [address] [place], [place] [address]   Pls regard; [phone]

## 2021-01-17 NOTE — Telephone Encounter (Signed)
Spoke with the pt  He states trelegy 200 working well and he has had to use neb less often  Rx sent to preferred pharm  Nothing further needed

## 2021-02-21 ENCOUNTER — Telehealth: Payer: Self-pay

## 2021-02-21 NOTE — Telephone Encounter (Signed)
Called pt to schedule AWV, no answer, LVMTRC. 

## 2021-03-07 ENCOUNTER — Ambulatory Visit (INDEPENDENT_AMBULATORY_CARE_PROVIDER_SITE_OTHER): Payer: Medicare HMO

## 2021-03-07 DIAGNOSIS — Z0000 Encounter for general adult medical examination without abnormal findings: Secondary | ICD-10-CM | POA: Diagnosis not present

## 2021-03-07 NOTE — Progress Notes (Signed)
Subjective:   Timothy Thomas is a 55 y.o. male who presents for an Initial Medicare Annual Wellness Visit.I connected with  Timothy Thomas on 03/07/21 by a audio enabled telemedicine application and verified that I am speaking with the correct person using two identifiers.   I discussed the limitations of evaluation and management by telemedicine. The patient expressed understanding and agreed to proceed.   Location of patient: Home Location of provider: Office  Persons participating in visit: Timothy Thomas (patient) and Donnetta Hail, CMA  Review of Systems    Defer to PCP       Objective:    There were no vitals filed for this visit. There is no height or weight on file to calculate BMI.  Advanced Directives 03/07/2021 05/07/2017 03/24/2017 11/05/2016 10/02/2016 09/26/2016  Does Patient Have a Medical Advance Directive? No No No No No No  Would patient like information on creating a medical advance directive? - No - Patient declined - - - No - Patient declined    Current Medications (verified) Outpatient Encounter Medications as of 03/07/2021  Medication Sig   albuterol (PROVENTIL) (2.5 MG/3ML) 0.083% nebulizer solution INHALE THE CONTENTS OF ONE VIAL IN NEBULIZER EVERY 6 HOURS AS NEEDED   albuterol (VENTOLIN HFA) 108 (90 Base) MCG/ACT inhaler INHALE TWO PUFFS BY MOUTH EVERY 6 HOURS AS NEEDED FOR WHEEZING AND FOR SHORTNESS OF BREATH   aspirin EC 81 MG tablet Take 81 mg by mouth daily.   atorvastatin (LIPITOR) 40 MG tablet TAKE ONE TABLET BY MOUTH EVERY EVENING AT 6PM   budesonide (PULMICORT) 0.5 MG/2ML nebulizer solution TAKE BY NEBULIZALTION TWO TIMES A DAY   cetirizine (ZYRTEC) 10 MG tablet Take 10 mg by mouth daily.   chlorpheniramine (CHLOR-TRIMETON) 4 MG tablet Take 4 mg by mouth 2 (two) times daily as needed for allergies.   diphenhydramine-acetaminophen (TYLENOL PM) 25-500 MG TABS tablet Take 3 tablets by mouth at bedtime.    Docusate Sodium (COLACE PO) Take  by mouth.   Fluticasone-Umeclidin-Vilant (TRELEGY ELLIPTA) 200-62.5-25 MCG/INH AEPB Inhale 1 puff into the lungs daily.   Fluticasone-Umeclidin-Vilant (TRELEGY ELLIPTA) 200-62.5-25 MCG/INH AEPB Inhale 1 puff into the lungs daily.   furosemide (LASIX) 20 MG tablet 1-2 tabs daily for leg swelling   ipratropium (ATROVENT) 0.02 % nebulizer solution TAKE 2.5 ML VIA NEBULIZER FOUR TIMES A DAY   metFORMIN (GLUCOPHAGE) 500 MG tablet Take 1 tablet (500 mg total) by mouth 2 (two) times daily with a meal.   omeprazole (PRILOSEC) 20 MG capsule TAKE ONE CAPSULE BY MOUTH DAILY   traZODone (DESYREL) 100 MG tablet Take 1 tablet (100 mg total) by mouth at bedtime as needed for sleep.   warfarin (COUMADIN) 5 MG tablet Take 10 mg by mouth daily.   No facility-administered encounter medications on file as of 03/07/2021.    Allergies (verified) Codeine and Ketorolac   History: Past Medical History:  Diagnosis Date   Aphasia    Transient - negative head CT and EEG with neurology workup February 2017 [facility])   COPD (chronic obstructive pulmonary disease) (HCC)    Glaucoma    Macular degeneration    Myocardial infarction (HCC)    Obesity    Obstructive sleep apnea    Sleep apnea    Tachycardia-bradycardia syndrome (HCC)    Status post pacemaker   Urinary retention 08/2019   Variant angina [facility])    History of normal coronary arteries at cardiac catheterization 2002 - vasospasm noted   Vasovagal syncope  Past Surgical History:  Procedure Laterality Date   LEFT HEART CATH AND CORONARY ANGIOGRAPHY N/A 09/28/2016   Procedure: Left Heart Cath and Coronary Angiography;  Surgeon: Orpah Cobb, MD;  Location: [facility] INVASIVE CV LAB;  Service: Cardiovascular;  Laterality: N/A;   PACEMAKER INSERTION     Family History  Problem Relation Age of Onset   Hypertension Mother    Diabetes Father    Hypertension Father    Stroke Father    Social History   Socioeconomic History   Marital status: Married     Spouse name: Not on file   Number of children: Not on file   Years of education: Not on file   Highest education level: Not on file  Occupational History   Not on file  Tobacco Use   Smoking status: Every Day    Packs/day: 1.00    Years: 40.00    Pack years: 40.00    Types: Cigarettes    Start date: 09/26/1976   Smokeless tobacco: Never   Tobacco comments:    currently smoking 1/2ppd  Vaping Use   Vaping Use: Never used  Substance and Sexual Activity   Alcohol use: No   Drug use: No   Sexual activity: Yes  Other Topics Concern   Not on file  Social History Narrative   Not on file   Social Determinants of Health   Financial Resource Strain: Low Risk    Difficulty of Paying Living Expenses: Not hard at all  Food Insecurity: No Food Insecurity   Worried About Programme researcher, broadcasting/film/video in the Last Year: Never true   Ran Out of Food in the Last Year: Never true  Transportation Needs: No Transportation Needs   Lack of Transportation (Medical): No   Lack of Transportation (Non-Medical): No  Physical Activity: Insufficiently Active   Days of Exercise per Week: 2 days   Minutes of Exercise per Session: 10 min  Stress: Stress Concern Present   Feeling of Stress : Very much  Social Connections: Moderately Integrated   Frequency of Communication with Friends and Family: More than three times a week   Frequency of Social Gatherings with Friends and Family: More than three times a week   Attends Religious Services: More than 4 times per year   Active Member of [employer] or Organizations: No   Attends Engineer, structural: Never   Marital Status: Married    Tobacco Counseling Ready to quit: Not Answered Counseling given: Not Answered Tobacco comments: currently smoking 1/2ppd   Clinical Intake:  Pre-visit preparation completed: Yes  Pain : No/denies pain     Diabetes: Yes CBG done?: No Did pt. bring in CBG monitor from home?: No  How often do you need to have  someone help you when you read instructions, pamphlets, or other written materials from your doctor or pharmacy?: 1 - Never What is the last grade level you completed in school?: 12th grade  Diabetic?Yes  Interpreter Needed?: No  Information entered by :: Donnetta Hail, CMA   Activities of Daily Living No flowsheet data found.  Patient Care Team: Massie Maroon, FNP as PCP - General (Family Medicine)  Indicate any recent Medical Services you may have received from other than [facility] providers in the past year (date may be approximate).     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  Hearing/Vision screen No results found.  Dietary issues and exercise activities discussed:     Goals Addressed  None    Depression Screen PHQ 2/9 Scores 03/07/2021 10/02/2020 02/09/2020 08/09/2019 04/12/2019 12/22/2018 06/23/2018  PHQ - 2 Score 0 0 2 0 0 0 0  PHQ- 9 Score - - 6 - - - -    Fall Risk Fall Risk  03/07/2021 10/02/2020 02/09/2020 08/09/2019 04/12/2019  Falls in the past year? 0 0 0 0 0  Number falls in past yr: 0 0 0 0 0  Injury with Fall? 0 0 0 0 0  Risk for fall due to : No Fall Risks No Fall Risks No Fall Risks - -  Follow up Falls evaluation completed - - - -    FALL RISK PREVENTION PERTAINING TO THE HOME:  Any stairs in or around the home? Yes  If so, are there any without handrails? No  Home free of loose throw rugs in walkways, pet beds, electrical cords, etc? Yes  Adequate lighting in your home to reduce risk of falls? Yes   ASSISTIVE DEVICES UTILIZED TO PREVENT FALLS:  Life alert? No  Use of a cane, walker or w/c? No  Grab bars in the bathroom? No  Shower chair or bench in shower? No  Elevated toilet seat or a handicapped toilet? No   TIMED UP AND GO:  Was the test performed?  N/A .  Length of time to ambulate 10 feet: N/A sec.     Cognitive Function:     6CIT Screen 03/07/2021  What Year? 0 points  What month? 0 points  What time? 0  points  Count back from 20 0 points  Months in reverse 0 points  Repeat phrase 0 points  Total Score 0    Immunizations Immunization History  Administered Date(s) Administered   Influenza,inj,Quad PF,6+ Mos 02/09/2017, 04/06/2018, 03/29/2019, 03/28/2020   PFIZER(Purple Top)SARS-COV-2 Vaccination 08/18/2019, 09/06/2019   Pneumococcal Polysaccharide-23 10/02/2016   Tdap 12/22/2018    TDAP status: Up to date  Flu Vaccine status: Due, Education has been provided regarding the importance of this vaccine. Advised may receive this vaccine at local pharmacy or Health Dept. Aware to provide a copy of the vaccination record if obtained from local pharmacy or Health Dept. Verbalized acceptance and understanding.    Covid-19 vaccine status: Completed vaccines  Qualifies for Shingles Vaccine? Yes   Zostavax completed No   Shingrix Completed?: No.    Education has been provided regarding the importance of this vaccine. Patient has been advised to call insurance company to determine out of pocket expense if they have not yet received this vaccine. Advised may also receive vaccine at local pharmacy or Health Dept. Verbalized acceptance and understanding.  Screening Tests Health Maintenance  Topic Date Due   OPHTHALMOLOGY EXAM  Never done   URINE MICROALBUMIN  Never done   Zoster Vaccines- Shingrix (1 of 2) Never done   COVID-19 Vaccine (3 - Booster for Pfizer series) 02/06/2020   HEMOGLOBIN A1C  08/08/2020   INFLUENZA VACCINE  01/06/2021   FOOT EXAM  10/04/2021 (Originally 12/20/1975)   COLONOSCOPY (Pts 45-5yrs Insurance coverage will need to be confirmed)  10/04/2021 (Originally 12/20/2008)   TETANUS/TDAP  12/21/2028   Hepatitis C Screening  Completed   HIV Screening  Completed   HPV VACCINES  Aged Out    Health Maintenance  Health Maintenance Due  Topic Date Due   OPHTHALMOLOGY EXAM  Never done   URINE MICROALBUMIN  Never done   Zoster Vaccines- Shingrix (1 of 2) Never done    COVID-19 Vaccine (3 - Booster  for Pfizer series) 02/06/2020   HEMOGLOBIN A1C  08/08/2020   INFLUENZA VACCINE  01/06/2021    Colorectal cancer screening: Type of screening: Colonoscopy. Completed Postponed until 10/04/21. Repeat every 10 years  Lung Cancer Screening: (Low Dose CT Chest recommended if Age 25-80 years, 30 pack-year currently smoking OR have quit w/in 15years.) does qualify.   Lung Cancer Screening Referral: No  Additional Screening:  Hepatitis C Screening: does qualify; Completed 10/02/2016  Vision Screening: Recommended annual ophthalmology exams for early detection of glaucoma and other disorders of the eye. Is the patient up to date with their annual eye exam?  No  Who is the provider or what is the name of the office in which the patient attends annual eye exams? N/A If pt is not established with a provider, would they like to be referred to a provider to establish care? No .   Dental Screening: Recommended annual dental exams for proper oral hygiene  Community Resource Referral / Chronic Care Management: CRR required this visit?  No   CCM required this visit?  No      Plan:     I have personally reviewed and noted the following in the patient's chart:   Medical and social history Use of alcohol, tobacco or illicit drugs  Current medications and supplements including opioid prescriptions. Patient is not currently taking opioid prescriptions. Functional ability and status Nutritional status Physical activity Advanced directives List of other physicians Hospitalizations, surgeries, and ER visits in previous 12 months Vitals Screenings to include cognitive, depression, and falls Referrals and appointments  In addition, I have reviewed and discussed with patient certain preventive protocols, quality metrics, and best practice recommendations. A written personalized care plan for preventive services as well as general preventive health recommendations were  provided to patient.     Donnetta Hail, [facility]   03/07/2021   Nurse Notes: Non-Face to Face 60 minute visit Encounter.  Mr. Wafer , Thank you for taking time to come for your Medicare Wellness Visit. I appreciate your ongoing commitment to your health goals. Please review the following plan we discussed and let me know if I can assist you in the future.   These are the goals we discussed:  Goals      Quit smoking / using tobacco        This is a list of the screening recommended for you and due dates:  Health Maintenance  Topic Date Due   Eye exam for diabetics  Never done   Urine Protein Check  Never done   Zoster (Shingles) Vaccine (1 of 2) Never done   COVID-19 Vaccine (3 - Booster for Pfizer series) 02/06/2020   Hemoglobin A1C  08/08/2020   Flu Shot  01/06/2021   Complete foot exam   10/04/2021*   Colon Cancer Screening  10/04/2021*   Tetanus Vaccine  12/21/2028   Hepatitis C Screening: USPSTF Recommendation to screen - Ages 18-79 yo.  Completed   HIV Screening  Completed   HPV Vaccine  Aged Out  *Topic was postponed. The date shown is not the original due date.

## 2021-03-07 NOTE — Patient Instructions (Signed)
Health Maintenance, Male Adopting a healthy lifestyle and getting preventive care are important in promoting health and wellness. Ask your health care provider about: The right schedule for you to have regular tests and exams. Things you can do on your own to prevent diseases and keep yourself healthy. What should I know about diet, weight, and exercise? Eat a healthy diet  Eat a diet that includes plenty of vegetables, fruits, low-fat dairy products, and lean protein. Do not eat a lot of foods that are high in solid fats, added sugars, or sodium. Maintain a healthy weight Body mass index (BMI) is a measurement that can be used to identify possible weight problems. It estimates body fat based on height and weight. Your health care provider can help determine your BMI and help you achieve or maintain a healthy weight. Get regular exercise Get regular exercise. This is one of the most important things you can do for your health. Most adults should: Exercise for at least 150 minutes each week. The exercise should increase your heart rate and make you sweat (moderate-intensity exercise). Do strengthening exercises at least twice a week. This is in addition to the moderate-intensity exercise. Spend less time sitting. Even light physical activity can be beneficial. Watch cholesterol and blood lipids Have your blood tested for lipids and cholesterol at 57 years of age, then have this test every 5 years. You may need to have your cholesterol levels checked more often if: Your lipid or cholesterol levels are high. You are older than 57 years of age. You are at high risk for heart disease. What should I know about cancer screening? Many types of cancers can be detected early and may often be prevented. Depending on your health history and family history, you may need to have cancer screening at various ages. This may include screening for: Colorectal cancer. Prostate cancer. Skin cancer. Lung  cancer. What should I know about heart disease, diabetes, and high blood pressure? Blood pressure and heart disease High blood pressure causes heart disease and increases the risk of stroke. This is more likely to develop in people who have high blood pressure readings, are of African descent, or are overweight. Talk with your health care provider about your target blood pressure readings. Have your blood pressure checked: Every 3-5 years if you are 18-39 years of age. Every year if you are 40 years old or older. If you are between the ages of 65 and 75 and are a current or former smoker, ask your health care provider if you should have a one-time screening for abdominal aortic aneurysm (AAA). Diabetes Have regular diabetes screenings. This checks your fasting blood sugar level. Have the screening done: Once every three years after age 45 if you are at a normal weight and have a low risk for diabetes. More often and at a younger age if you are overweight or have a high risk for diabetes. What should I know about preventing infection? Hepatitis B If you have a higher risk for hepatitis B, you should be screened for this virus. Talk with your health care provider to find out if you are at risk for hepatitis B infection. Hepatitis C Blood testing is recommended for: Everyone born from 1945 through 1965. Anyone with known risk factors for hepatitis C. Sexually transmitted infections (STIs) You should be screened each year for STIs, including gonorrhea and chlamydia, if: You are sexually active and are younger than 57 years of age. You are older than 57 years   of age and your health care provider tells you that you are at risk for this type of infection. Your sexual activity has changed since you were last screened, and you are at increased risk for chlamydia or gonorrhea. Ask your health care provider if you are at risk. Ask your health care provider about whether you are at high risk for HIV.  Your health care provider may recommend a prescription medicine to help prevent HIV infection. If you choose to take medicine to prevent HIV, you should first get tested for HIV. You should then be tested every 3 months for as long as you are taking the medicine. Follow these instructions at home: Lifestyle Do not use any products that contain nicotine or tobacco, such as cigarettes, e-cigarettes, and chewing tobacco. If you need help quitting, ask your health care provider. Do not use street drugs. Do not share needles. Ask your health care provider for help if you need support or information about quitting drugs. Alcohol use Do not drink alcohol if your health care provider tells you not to drink. If you drink alcohol: Limit how much you have to 0-2 drinks a day. Be aware of how much alcohol is in your drink. In the [place] one drink equals one 12 oz bottle of beer (355 mL), one 5 oz glass of wine (148 mL), or one 1 oz glass of hard liquor (44 mL). General instructions Schedule regular health, dental, and eye exams. Stay current with your vaccines. Tell your health care provider if: You often feel depressed. You have ever been abused or do not feel safe at home. Summary Adopting a healthy lifestyle and getting preventive care are important in promoting health and wellness. Follow your health care provider's instructions about healthy diet, exercising, and getting tested or screened for diseases. Follow your health care provider's instructions on monitoring your cholesterol and blood pressure. This information is not intended to replace advice given to you by your health care provider. Make sure you discuss any questions you have with your health care provider. Document Revised: 08/02/2020 Document Reviewed: 05/18/2018 Elsevier Patient Education  2022 [employer] Inc[employer]  

## 2021-03-24 ENCOUNTER — Other Ambulatory Visit: Payer: Self-pay | Admitting: Pulmonary Disease

## 2021-03-24 DIAGNOSIS — J449 Chronic obstructive pulmonary disease, unspecified: Secondary | ICD-10-CM

## 2021-04-01 IMAGING — DX DG CHEST 2V
2 series · 3 of 3 positions shown · non-contrast
Comparison: 10/28/2017

CLINICAL DATA: Chronic cough. Hx of COPD, MI, PE, DM, stent.
Smoker.

EXAM:
CHEST - 2 VIEW

[Series 1: chest pa · 0.14mm/px · 2 of 2 slices shown]
[im 1/2]
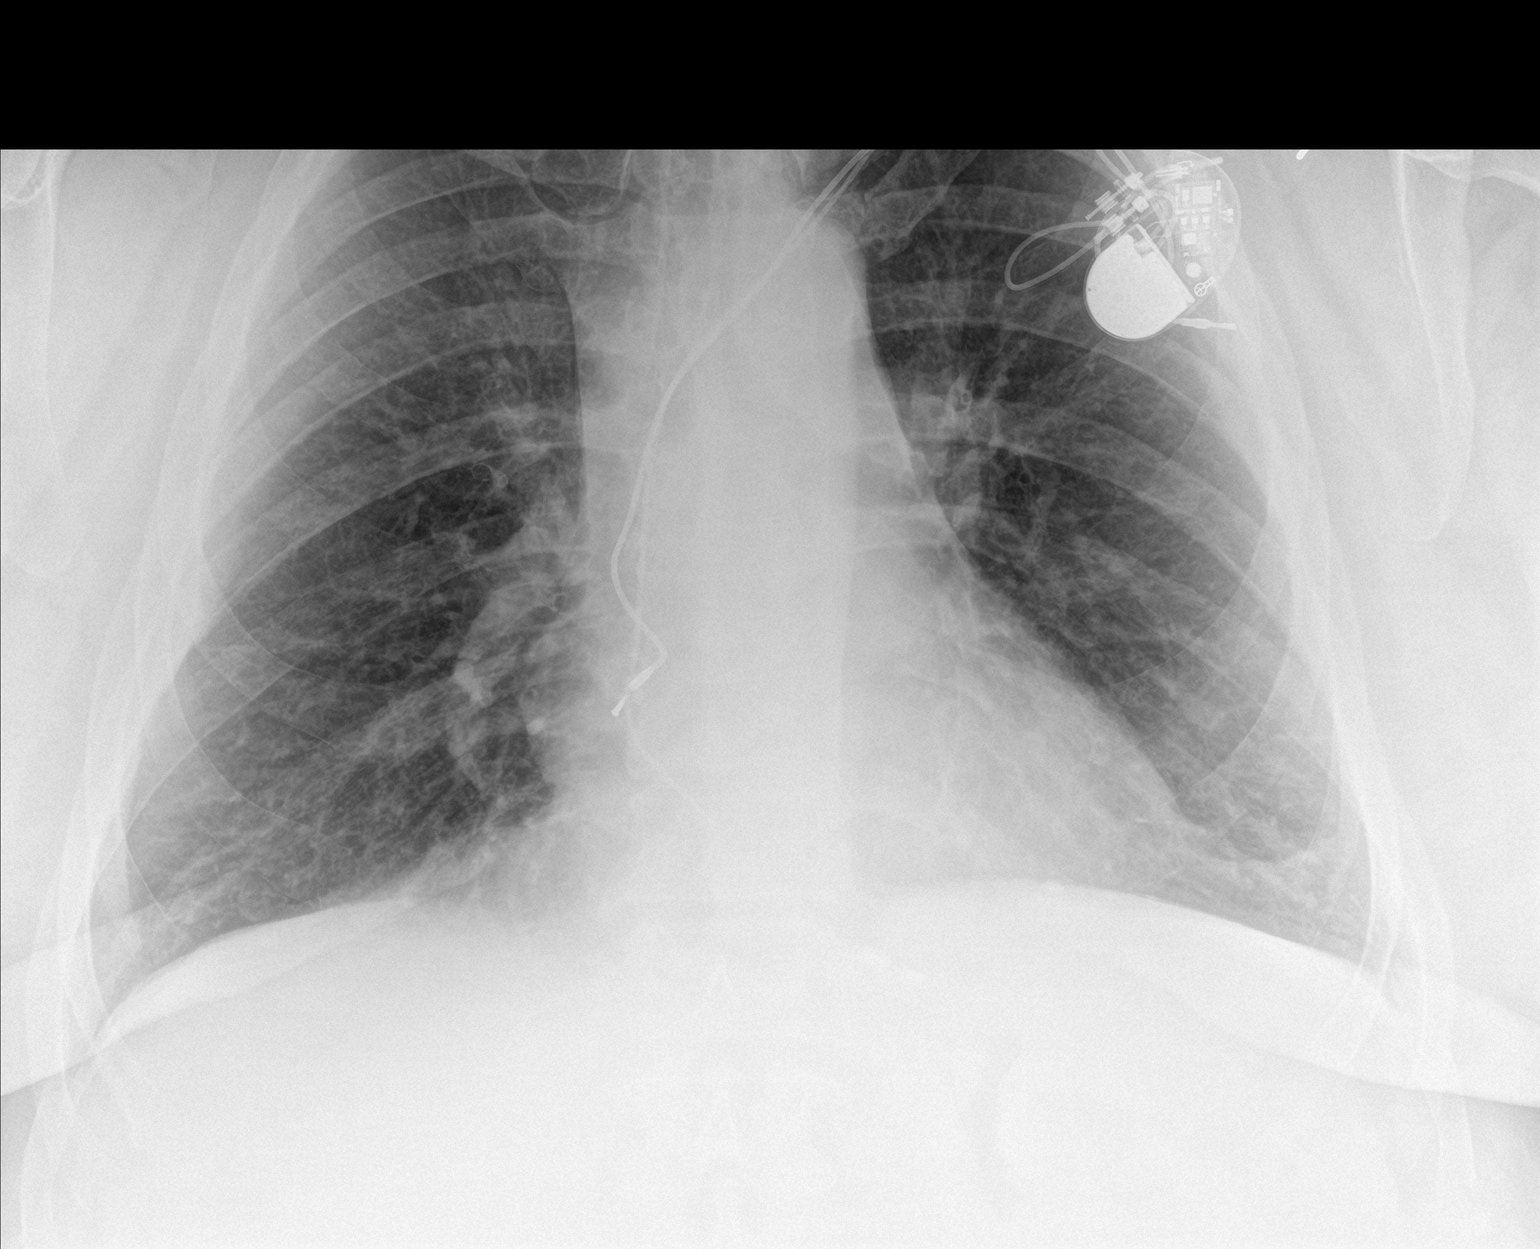
[im 2/2]
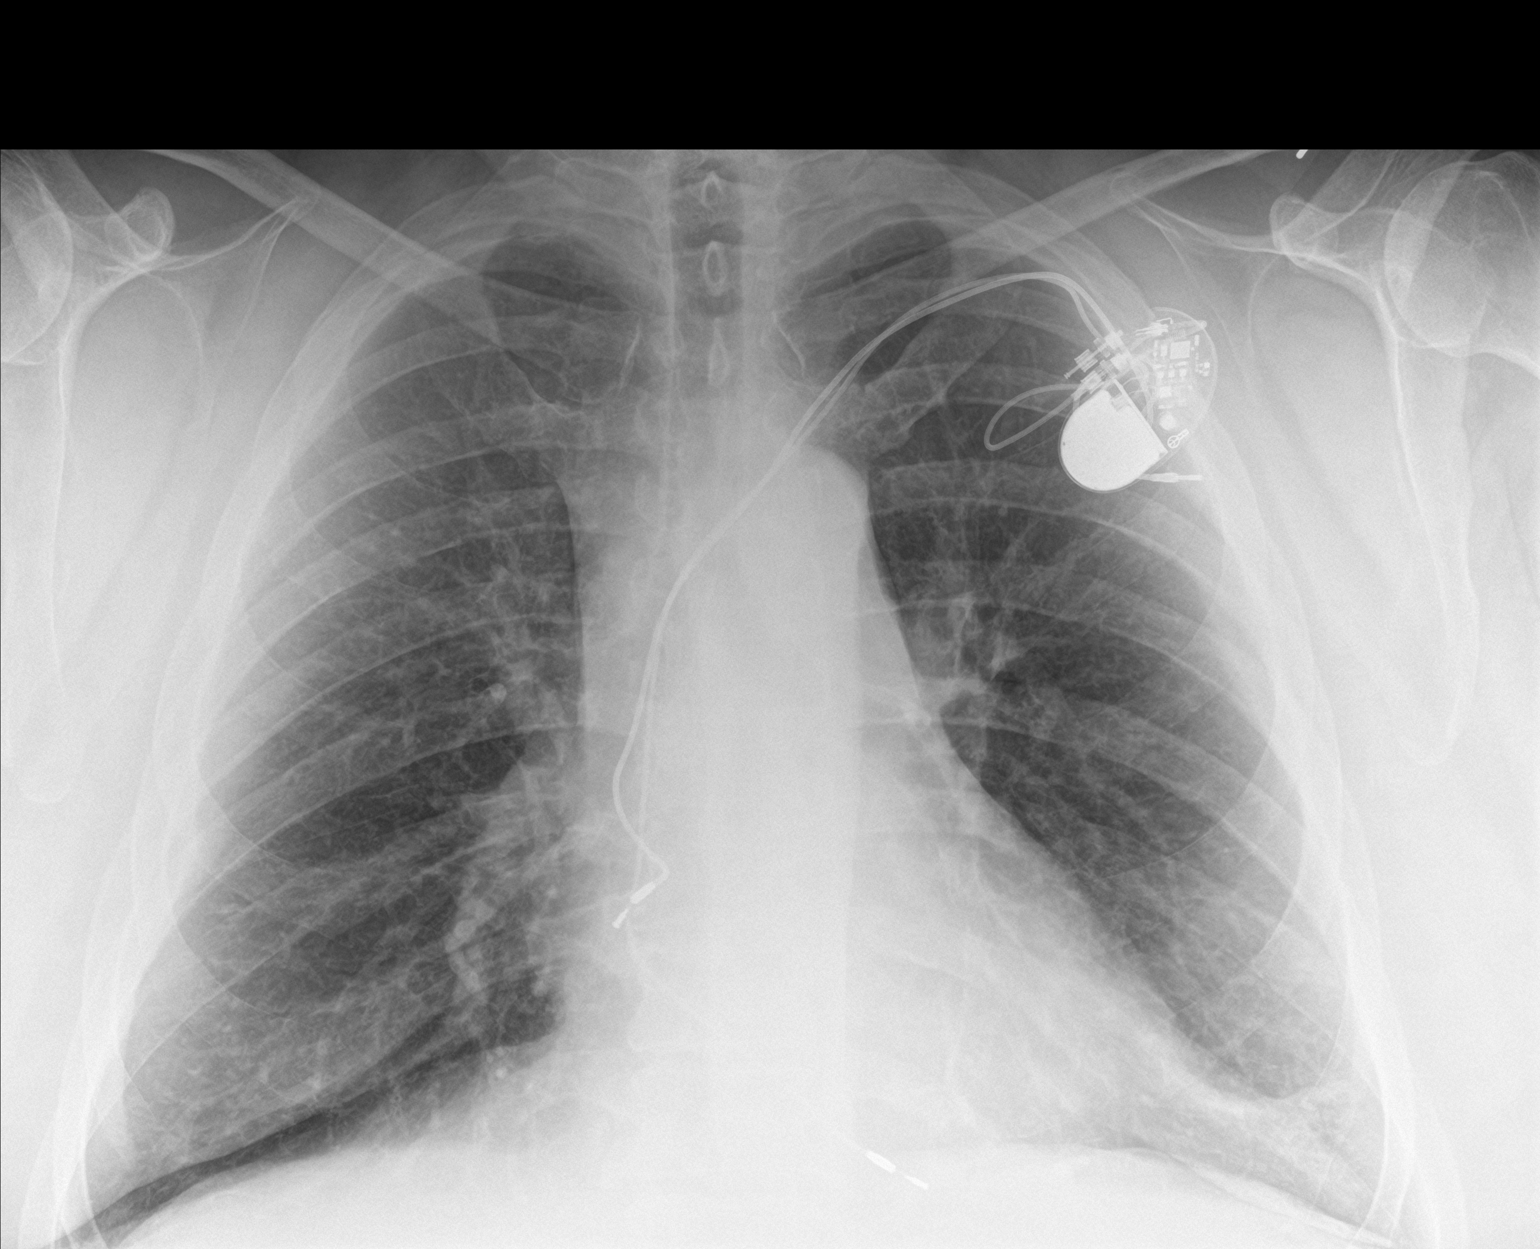

[chest lat]
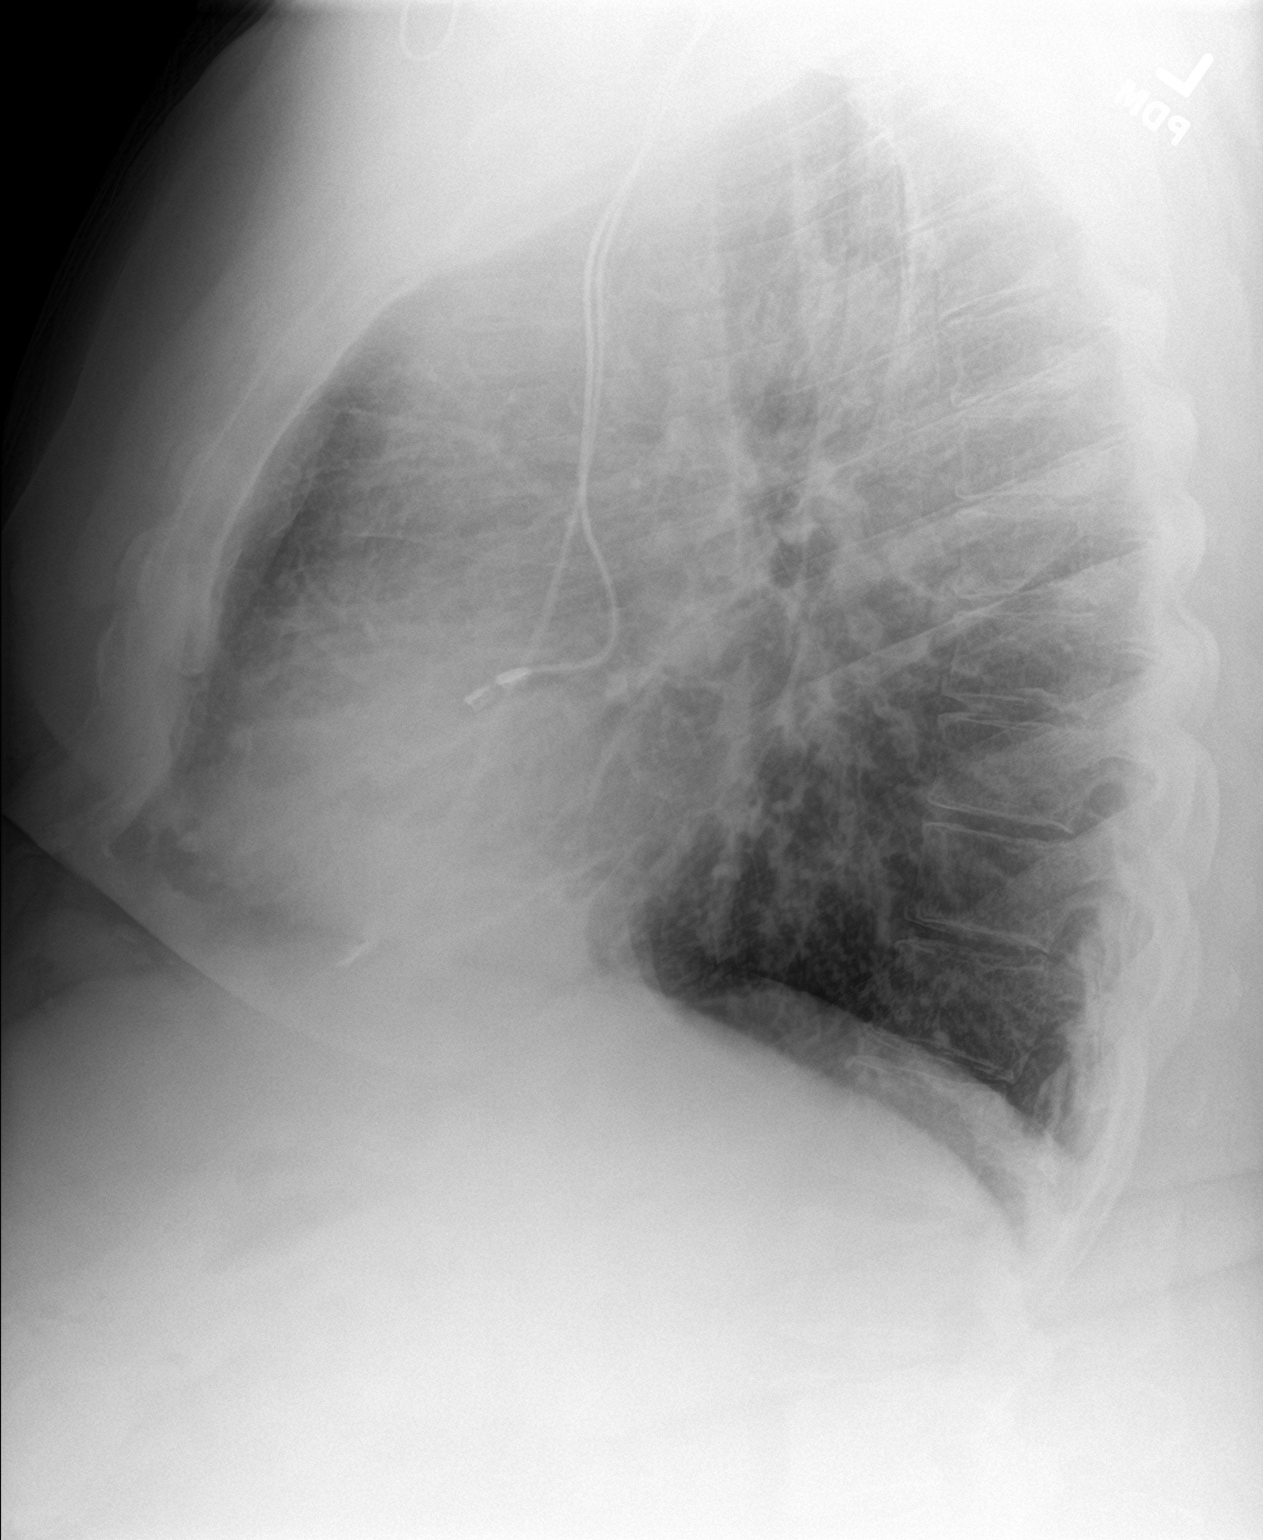

[3 of 3 positions shown; findings below may reference images not displayed]

FINDINGS: The heart is UPPER limits normal in size. The lungs are free of
focal consolidations and pleural effusions. There is mild perihilar
peribronchial thickening. Stable appearance of LEFT transvenous
pacemaker with leads overlying the RIGHT atrium and RIGHT ventricle.
There is no pulmonary edema.
IMPRESSION: 1. Bronchitic changes.
2. No focal acute pulmonary abnormality.

## 2021-04-04 ENCOUNTER — Encounter: Payer: Self-pay | Admitting: Nurse Practitioner

## 2021-04-04 ENCOUNTER — Other Ambulatory Visit: Payer: Self-pay

## 2021-04-04 ENCOUNTER — Ambulatory Visit (INDEPENDENT_AMBULATORY_CARE_PROVIDER_SITE_OTHER): Payer: Medicare HMO | Admitting: Nurse Practitioner

## 2021-04-04 VITALS — BP 132/62 | HR 69 | Temp 98.2°F | Ht 70.0 in | Wt 356.8 lb

## 2021-04-04 DIAGNOSIS — G4733 Obstructive sleep apnea (adult) (pediatric): Secondary | ICD-10-CM

## 2021-04-04 DIAGNOSIS — Z23 Encounter for immunization: Secondary | ICD-10-CM | POA: Diagnosis not present

## 2021-04-04 DIAGNOSIS — Z1211 Encounter for screening for malignant neoplasm of colon: Secondary | ICD-10-CM

## 2021-04-04 DIAGNOSIS — Z9989 Dependence on other enabling machines and devices: Secondary | ICD-10-CM

## 2021-04-04 DIAGNOSIS — E119 Type 2 diabetes mellitus without complications: Secondary | ICD-10-CM

## 2021-04-04 DIAGNOSIS — E785 Hyperlipidemia, unspecified: Secondary | ICD-10-CM

## 2021-04-04 DIAGNOSIS — F4310 Post-traumatic stress disorder, unspecified: Secondary | ICD-10-CM | POA: Diagnosis not present

## 2021-04-04 DIAGNOSIS — Z125 Encounter for screening for malignant neoplasm of prostate: Secondary | ICD-10-CM

## 2021-04-04 DIAGNOSIS — M25512 Pain in left shoulder: Secondary | ICD-10-CM

## 2021-04-04 LAB — POCT GLYCOSYLATED HEMOGLOBIN (HGB A1C)
HbA1c POC (<> result, manual entry): 6.8 % (ref 4.0–5.6)
HbA1c, POC (controlled diabetic range): 6.8 % (ref 0.0–7.0)
HbA1c, POC (prediabetic range): 6.8 % — AB (ref 5.7–6.4)
Hemoglobin A1C: 6.8 % — AB (ref 4.0–5.6)

## 2021-04-04 NOTE — Progress Notes (Signed)
[facility] Patient [facility] [address] [place], [place]  [address] Phone:  [phone]   Fax:  [phone]   Established Patient Office Visit  Subjective:  Patient ID: Timothy Thomas, male    DOB: 10/25/1965  Age: 55 y.o. MRN: [id number]  CC:  Chief Complaint  Patient presents with   Follow-up    Pt is here today for his follow up visit. Pt is wanting his PSA testing, flu vaccine and pneumonia vaccine.    HPI Timothy Thomas presents for follow up. He  has a past medical history of Aphasia, COPD (chronic obstructive pulmonary disease) (HCC), Glaucoma, Macular degeneration, Myocardial infarction (HCC), Obesity, Obstructive sleep apnea, Sleep apnea, Tachycardia-bradycardia syndrome (HCC), Urinary retention (08/2019), Variant angina (HCC), and Vasovagal syncope.   He is followed by Hematology for his coumadin monitoring. He is going to have a CT scan . He reports that he has episodes where he gets cold and shivers "passes out". He sleeps for 30 -45 mins then wakes ups and he is hot like he has 3 blankets on. This last a little while this results in a change in his coumadin level. He has had a work up for seizure disorder which was negative.  He has failed other anticougualnt treatment.   He does not monitor his CBG. He reports that he eats whatever the wants.   He was started on Trelegy 2 mons ago and this has been very effective. He is only haivng to complete 1 neb daily verses his previous ongoing treatments. He does continue to smoke.   He is having left shoulder pain with limitations. He denies any recent injury. He is able to flex and extend however is unable to completely abduct. He denies any numbness or tingling. He has not had a previous evaluation. He continues to have right lower back and leg pain. He feels like this related to old age.   Past Medical History:  Diagnosis Date   Aphasia    Transient - negative head CT and EEG with neurology workup February 2017  [facility])   COPD (chronic obstructive pulmonary disease) (HCC)    Glaucoma    Macular degeneration    Myocardial infarction (HCC)    Obesity    Obstructive sleep apnea    Sleep apnea    Tachycardia-bradycardia syndrome (HCC)    Status post pacemaker   Urinary retention 08/2019   Variant angina (HCC)    History of normal coronary arteries at cardiac catheterization 2002 - vasospasm noted   Vasovagal syncope     Past Surgical History:  Procedure Laterality Date   LEFT HEART CATH AND CORONARY ANGIOGRAPHY N/A 09/28/2016   Procedure: Left Heart Cath and Coronary Angiography;  Surgeon: Orpah Cobb, MD;  Location: [facility] INVASIVE CV LAB;  Service: Cardiovascular;  Laterality: N/A;   PACEMAKER INSERTION      Family History  Problem Relation Age of Onset   Hypertension Mother    Diabetes Father    Hypertension Father    Stroke Father     Social History   Socioeconomic History   Marital status: Married    Spouse name: Not on file   Number of children: Not on file   Years of education: Not on file   Highest education level: Not on file  Occupational History   Not on file  Tobacco Use   Smoking status: Every Day    Packs/day: 0.25    Years: 40.00    Pack years: 10.00  Types: Cigarettes    Start date: 09/26/1976   Smokeless tobacco: Never   Tobacco comments:    currently smoking 1/2ppd  Vaping Use   Vaping Use: Never used  Substance and Sexual Activity   Alcohol use: No   Drug use: No   Sexual activity: Yes  Other Topics Concern   Not on file  Social History Narrative   Not on file   Social Determinants of Health   Financial Resource Strain: Low Risk    Difficulty of Paying Living Expenses: Not hard at all  Food Insecurity: No Food Insecurity   Worried About Programme researcher, broadcasting/film/video in the Last Year: Never true   Ran Out of Food in the Last Year: Never true  Transportation Needs: No Transportation Needs   Lack of Transportation (Medical): No   Lack of Transportation  (Non-Medical): No  Physical Activity: Insufficiently Active   Days of Exercise per Week: 2 days   Minutes of Exercise per Session: 10 min  Stress: Stress Concern Present   Feeling of Stress : Very much  Social Connections: Moderately Integrated   Frequency of Communication with Friends and Family: More than three times a week   Frequency of Social Gatherings with Friends and Family: More than three times a week   Attends Religious Services: More than 4 times per year   Active Member of [employer] or Organizations: No   Attends Banker Meetings: Never   Marital Status: Married  Catering manager Violence: Not At Risk   Fear of Current or Ex-Partner: No   Emotionally Abused: No   Physically Abused: No   Sexually Abused: No    Outpatient Medications Prior to Visit  Medication Sig Dispense Refill   albuterol (PROVENTIL) (2.5 MG/3ML) 0.083% nebulizer solution USE THREE MILLILITERS VIA NEBULIZATION BY MOUTH EVERY 6 HOURS AS NEEDED FOR WHEEZING OR FOR SHORTNESS OF BREATH 375 mL 9   albuterol (VENTOLIN HFA) 108 (90 Base) MCG/ACT inhaler INHALE TWO PUFFS BY MOUTH EVERY 6 HOURS AS NEEDED FOR SHORTNESS OF BREATH AND FOR WHEEZING 18 g 9   aspirin EC 81 MG tablet Take 81 mg by mouth daily.     atorvastatin (LIPITOR) 40 MG tablet TAKE ONE TABLET BY MOUTH EVERY EVENING AT 6PM 90 tablet 3   cetirizine (ZYRTEC) 10 MG tablet Take 10 mg by mouth daily.     chlorpheniramine (CHLOR-TRIMETON) 4 MG tablet Take 4 mg by mouth 2 (two) times daily as needed for allergies.     diphenhydramine-acetaminophen (TYLENOL PM) 25-500 MG TABS tablet Take 3 tablets by mouth at bedtime.      Docusate Sodium (COLACE PO) Take by mouth.     Fluticasone-Umeclidin-Vilant (TRELEGY ELLIPTA) 200-62.5-25 MCG/INH AEPB Inhale 1 puff into the lungs daily. 28 each 0   Fluticasone-Umeclidin-Vilant (TRELEGY ELLIPTA) 200-62.5-25 MCG/INH AEPB Inhale 1 puff into the lungs daily. 60 each 11   furosemide (LASIX) 20 MG tablet 1-2 tabs  daily for leg swelling 90 tablet 3   metFORMIN (GLUCOPHAGE) 500 MG tablet Take 1 tablet (500 mg total) by mouth 2 (two) times daily with a meal. 180 tablet 3   omeprazole (PRILOSEC) 20 MG capsule TAKE ONE CAPSULE BY MOUTH DAILY 30 capsule 6   traZODone (DESYREL) 100 MG tablet Take 1 tablet (100 mg total) by mouth at bedtime as needed for sleep. 30 tablet 6   warfarin (COUMADIN) 5 MG tablet Take 10 mg by mouth daily.     budesonide (PULMICORT) 0.5 MG/2ML nebulizer solution TAKE  BY NEBULIZALTION TWO TIMES A DAY (Patient not taking: Reported on 04/04/2021) 60 mL 11   ipratropium (ATROVENT) 0.02 % nebulizer solution TAKE 2.5 ML VIA NEBULIZER FOUR TIMES A DAY (Patient not taking: Reported on 04/04/2021) 300 mL 4   No facility-administered medications prior to visit.    Allergies  Allergen Reactions   Codeine Nausea And Vomiting    Low tolerance to narcotics   Ketorolac Nausea And Vomiting    Vomiting      ROS Review of Systems    Objective:    Physical Exam Constitutional:      Appearance: He is obese.  HENT:     Head: Normocephalic and atraumatic.  Cardiovascular:     Rate and Rhythm: Normal rate and regular rhythm.     Pulses: Normal pulses.     Heart sounds: Normal heart sounds.  Pulmonary:     Effort: Pulmonary effort is normal.     Breath sounds: Normal breath sounds. No wheezing or rhonchi.  Abdominal:     Palpations: Abdomen is soft.     Comments: Increased abdominal girth   Musculoskeletal:     Left shoulder: Decreased range of motion.     Cervical back: Normal range of motion.  Skin:    General: Skin is warm.     Capillary Refill: Capillary refill takes less than 2 seconds.  Neurological:     General: No focal deficit present.     Mental Status: He is alert and oriented to person, place, and time.  Psychiatric:        Mood and Affect: Mood normal.        Behavior: Behavior normal.        Thought Content: Thought content normal.        Judgment: Judgment  normal.    BP 132/62   Pulse 69   Temp 98.2 F (36.8 C)   Ht 5\' 10"  (1.778 m)   Wt (!) 356 lb 12.8 oz (161.8 kg)   SpO2 98%   BMI 51.20 kg/m  Wt Readings from Last 3 Encounters:  04/04/21 (!) 356 lb 12.8 oz (161.8 kg)  01/01/21 (!) 349 lb (158.3 kg)  10/02/20 (!) 367 lb (166.5 kg)     Health Maintenance Due  Topic Date Due   OPHTHALMOLOGY EXAM  Never done   URINE MICROALBUMIN  Never done    There are no preventive care reminders to display for this patient.  Lab Results  Component Value Date   TSH 2.210 02/09/2020   Lab Results  Component Value Date   WBC 9.8 01/01/2021   HGB 12.8 (L) 01/01/2021   HCT 40.7 01/01/2021   MCV 84 01/01/2021   PLT 309 01/01/2021   Lab Results  Component Value Date   NA 142 04/12/2019   K 3.8 04/12/2019   CO2 22 04/12/2019   GLUCOSE 84 04/12/2019   BUN 7 04/12/2019   CREATININE 0.71 (L) 04/12/2019   BILITOT 0.3 04/12/2019   ALKPHOS 84 04/12/2019   AST 10 04/12/2019   ALT 15 04/12/2019   PROT 7.2 04/12/2019   ALBUMIN 4.5 04/12/2019   CALCIUM 9.4 04/12/2019   ANIONGAP 7 05/07/2017   GFR 115.49 08/25/2017   Lab Results  Component Value Date   CHOL 130 02/09/2020   Lab Results  Component Value Date   HDL 23 (L) 02/09/2020   Lab Results  Component Value Date   LDLCALC 58 02/09/2020   Lab Results  Component Value Date  TRIG 311 (H) 02/09/2020   Lab Results  Component Value Date   CHOLHDL 5.7 (H) 02/09/2020   Lab Results  Component Value Date   HGBA1C 6.8 (A) 04/04/2021   HGBA1C 6.8 04/04/2021   HGBA1C 6.8 (A) 04/04/2021   HGBA1C 6.8 04/04/2021      Assessment & Plan:   Problem List Items Addressed This Visit       Respiratory   OSA on CPAP Persisent     Endocrine   Type 2 diabetes mellitus without complication, without long-term current use of insulin (HCC) - Primary Controlled Encourage compliance with current treatment regimen   Encourage regular CBG monitoring Encourage contacting office if  excessive hyperglycemia and or hypoglycemia Lifestyle modification with healthy diet (fewer calories, more high fiber foods, whole grains and non-starchy vegetables, lower fat meat and fish, low-fat diary include healthy oils) regular exercise (physical activity) and weight loss Opthalmology exam discussed  Nutritional consult recommended Regular dental visits encouraged Home BP monitoring also encouraged goal <130/80    Relevant Orders   HgB A1c (Completed)   Microalbumin, urine   Other Visit Diagnoses     Hyperlipidemia LDL goal <100     Encouraged on going compliance with current medication regimen Eating a heart-healthy diet with less salt Encouraged regular physical activity  Recommend Weight loss    Relevant Orders   Lipid panel (Completed)   PTSD (post-traumatic stress disorder)     Persistent  Referral updated closer to resident   Relevant Orders   Ambulatory referral to Psychiatry   Screening for colon cancer       Relevant Orders   Ambulatory referral to Gastroenterology   Left shoulder pain, unspecified chronicity     Evaluation   Relevant Orders   DG Shoulder Left   Screening for malignant neoplasm of prostate       Relevant Orders   PSA (Completed)   Needs flu shot       Relevant Orders   Flu Vaccine QUAD 50mo+IM (Fluarix, Fluzone & Alfiuria Quad PF) (Completed)       No orders of the defined types were placed in this encounter.   Follow-up: Return in about 6 months (around 10/03/2021) for follow up [id number].    Barbette Merino, NP

## 2021-04-04 NOTE — Patient Instructions (Signed)
Urinary Frequency, Adult Urinary frequency means urinating more often than usual. You may urinate every 1-2 hours even though you drink a normal amount of fluid and do not have a bladder infection or condition. Although you urinate more often than normal,the total amount of urine produced in a day is normal. With urinary frequency, you may have an urgent need to urinate often. The stress and anxiety of needing to find a bathroom quickly can make this urge worse. This condition may go away on its own or you may need treatment at home. Home treatment may include bladder training, exercises, taking medicines, ormaking changes to your diet. Follow these instructions at home: Bladder health  Keep a bladder diary if told by your health care provider. Keep track of: What you eat and drink. How often you urinate. How much you urinate. Follow a bladder training program if told by your health care provider. This may include: Learning to delay going to the bathroom. Double urinating (voiding). This helps if you are not completely emptying your bladder. Scheduled voiding. Do Kegel exercises as told by your health care provider. Kegel exercises strengthen the muscles that help control urination, which may help the condition.  Eating and drinking If told by your health care provider, make diet changes, such as: Avoiding caffeine. Drinking fewer fluids, especially alcohol. Not drinking in the evening. Avoiding foods or drinks that may irritate the bladder. These include coffee, tea, soda, artificial sweeteners, citrus, tomato-based foods, and chocolate. Eating foods that help prevent or ease constipation. Constipation can make this condition worse. Your health care provider may recommend that you: Drink enough fluid to keep your urine pale yellow. Take over-the-counter or prescription medicines. Eat foods that are high in fiber, such as beans, whole grains, and fresh fruits and vegetables. Limit foods  that are high in fat and processed sugars, such as fried or sweet foods. General instructions Take over-the-counter and prescription medicines only as told by your health care provider. Keep all follow-up visits as told by your health care provider. This is important. Contact a health care provider if: You start urinating more often. You feel pain or irritation when you urinate. You notice blood in your urine. Your urine looks cloudy. You develop a fever. You begin vomiting. Get help right away if: You are unable to urinate. Summary Urinary frequency means urinating more often than usual. With urinary frequency, you may urinate every 1-2 hours even though you drink a normal amount of fluid and do not have a bladder infection or other bladder condition. Your health care provider may recommend that you keep a bladder diary, follow a bladder training program, or make dietary changes. If told by your health care provider, do Kegel exercises to strengthen the muscles that help control urination. Take over-the-counter and prescription medicines only as told by your health care provider. Contact a health care provider if your symptoms do not improve or get worse. This information is not intended to replace advice given to you by your health care provider. Make sure you discuss any questions you have with your healthcare provider. Document Revised: 12/02/2017 Document Reviewed: 12/02/2017 Elsevier Patient Education  2021 Elsevier Inc.  

## 2021-04-05 ENCOUNTER — Encounter: Payer: Self-pay | Admitting: Nurse Practitioner

## 2021-04-05 LAB — LIPID PANEL
Chol/HDL Ratio: 4.3 ratio (ref 0.0–5.0)
Cholesterol, Total: 120 mg/dL (ref 100–199)
HDL: 28 mg/dL — ABNORMAL LOW (ref >39)
LDL Chol Calc (NIH): 59 mg/dL (ref 0–99)
Triglycerides: 202 mg/dL — ABNORMAL HIGH (ref 0–149)
VLDL Cholesterol Cal: 33 mg/dL (ref 5–40)

## 2021-04-05 LAB — PSA: Prostate Specific Ag, Serum: 0.3 ng/mL (ref 0.0–4.0)

## 2021-04-07 LAB — MICROALBUMIN, URINE: Microalbumin, Urine: 16.4 ug/mL

## 2021-04-18 ENCOUNTER — Telehealth: Payer: Self-pay

## 2021-04-18 NOTE — Telephone Encounter (Signed)
Pt is getting messages about his shoulder? Possible Ortho referral  Please call pt

## 2021-04-21 ENCOUNTER — Other Ambulatory Visit: Payer: Self-pay | Admitting: Family Medicine

## 2021-04-21 ENCOUNTER — Other Ambulatory Visit: Payer: Self-pay | Admitting: Pulmonary Disease

## 2021-04-21 DIAGNOSIS — G4700 Insomnia, unspecified: Secondary | ICD-10-CM

## 2021-04-21 DIAGNOSIS — R053 Chronic cough: Secondary | ICD-10-CM

## 2021-04-21 NOTE — Telephone Encounter (Signed)
Left voicemail for callback.

## 2021-05-07 ENCOUNTER — Telehealth: Payer: Self-pay | Admitting: Pulmonary Disease

## 2021-05-07 ENCOUNTER — Other Ambulatory Visit: Payer: Self-pay | Admitting: Pulmonary Disease

## 2021-05-07 NOTE — Telephone Encounter (Signed)
Called and spoke with pt who states he is having tightness in chest as well as wheezing which began 3 days ago.  Denies any complaints of fever.  Pt states he is using his Trelegy every day as prescribed. States he has had to use his albuterol at least 3-4 a day within the last 3 days when the flare-up began.  Due to this, pt is wanting to know if he can have a refill of the prednisone. Pt does have a f/u scheduled with Dr. Vassie Loll in [place] 12/8.  Routing to Dr. Vassie Loll for him to address tomorrow 05/08/21.  Dr. Vassie Loll, please advise if you are okay with [place] sending in a pred Rx for pt and if so, which instructions.

## 2021-05-08 MED ORDER — PREDNISONE 10 MG PO TABS: ORAL_TABLET | ORAL | 0 refills | Status: DC

## 2021-05-08 NOTE — Telephone Encounter (Signed)
Oretha Milch, MD  You 11 minutes ago (8:46 AM)   Prednisone 10 mg tabs  Take 2 tabs daily with food x 5ds, then 1 tab daily with food x 5ds then STOP     Called and spoke with pt letting him know the info stated by Dr. Vassie Loll and he verbalized understanding. Rx has been sent to preferred pharmacy for pt. Nothing further needed.

## 2021-05-15 ENCOUNTER — Ambulatory Visit: Payer: Medicare HMO | Admitting: Pulmonary Disease

## 2021-05-15 ENCOUNTER — Other Ambulatory Visit: Payer: Self-pay

## 2021-05-15 ENCOUNTER — Encounter: Payer: Self-pay | Admitting: Pulmonary Disease

## 2021-05-15 DIAGNOSIS — G4733 Obstructive sleep apnea (adult) (pediatric): Secondary | ICD-10-CM

## 2021-05-15 DIAGNOSIS — J449 Chronic obstructive pulmonary disease, unspecified: Secondary | ICD-10-CM

## 2021-05-15 DIAGNOSIS — Z720 Tobacco use: Secondary | ICD-10-CM | POA: Diagnosis not present

## 2021-05-15 DIAGNOSIS — Z9989 Dependence on other enabling machines and devices: Secondary | ICD-10-CM

## 2021-05-15 IMAGING — CT CT CHEST W/O CM
1 of 2 series · 15 of 32 positions shown, 19 images · non-contrast
Comparison: 06/03/2017.

CLINICAL DATA: Lung nodule.

EXAM:
CT CHEST WITHOUT CONTRAST
TECHNIQUE: Multidetector CT imaging of the chest was performed following the
standard protocol without IV contrast.

[Series 4: lungs · axial · 0.83mm/px · z∈[+1249,+1507]mm · 15 of 141 slices shown, 19 images]
[im 6/141  mediastinal]
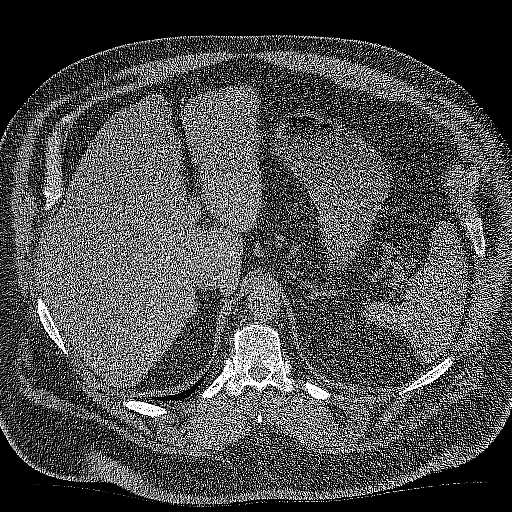
[im 6/141  lung]
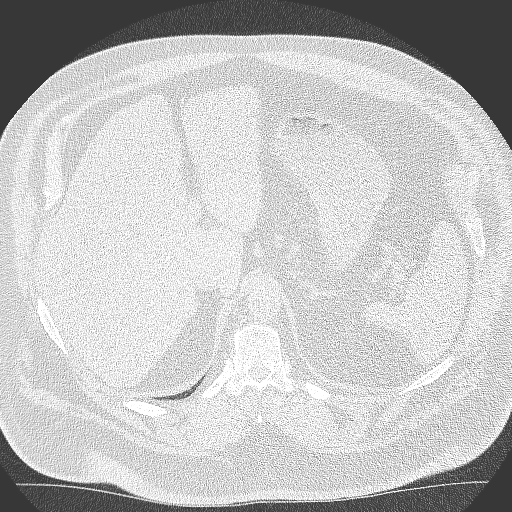
[im 16/141  lung]
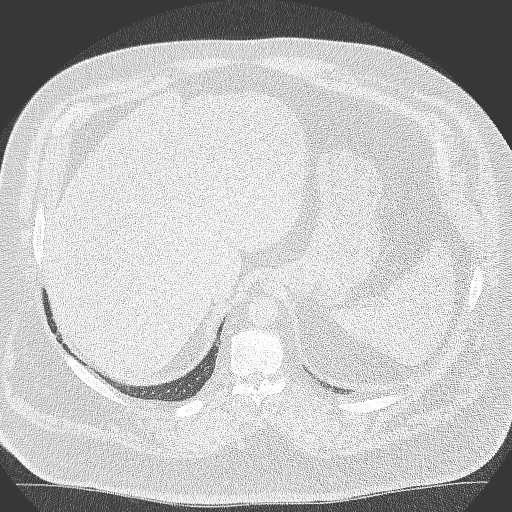
[im 26/141  lung]
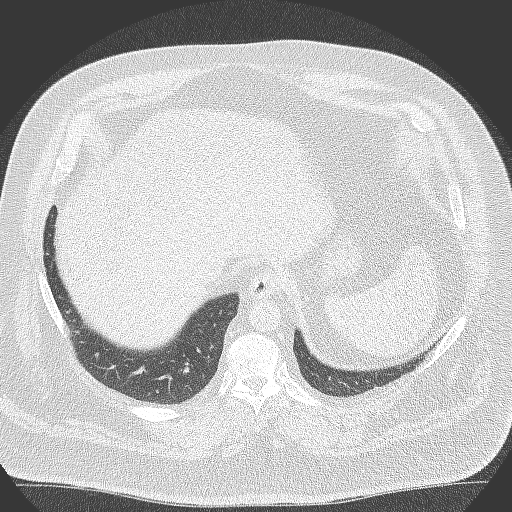
[im 37/141  lung]
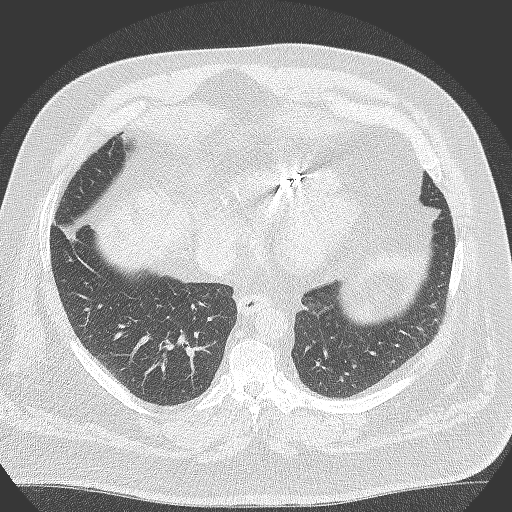
[im 42/141  mediastinal]
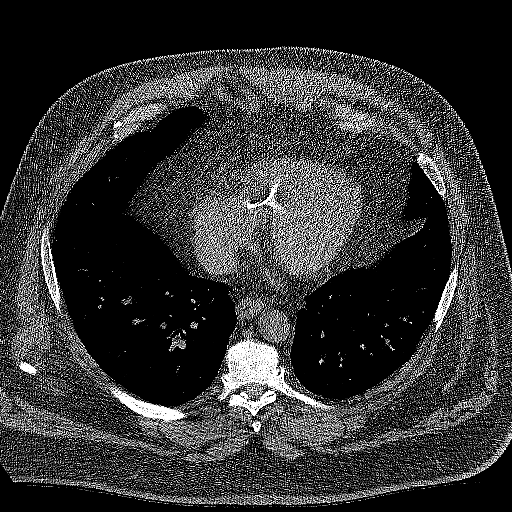
[im 42/141  lung]
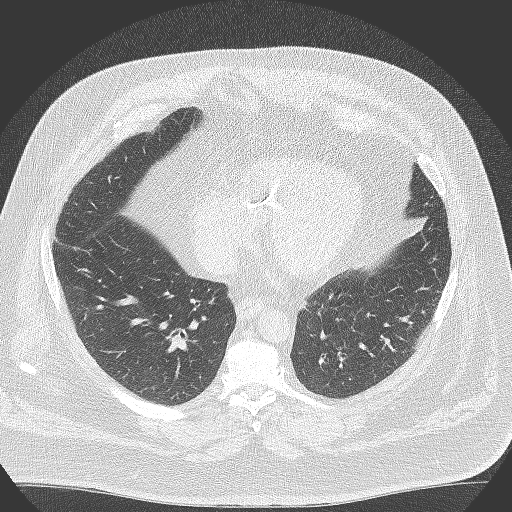
[im 52/141  lung]
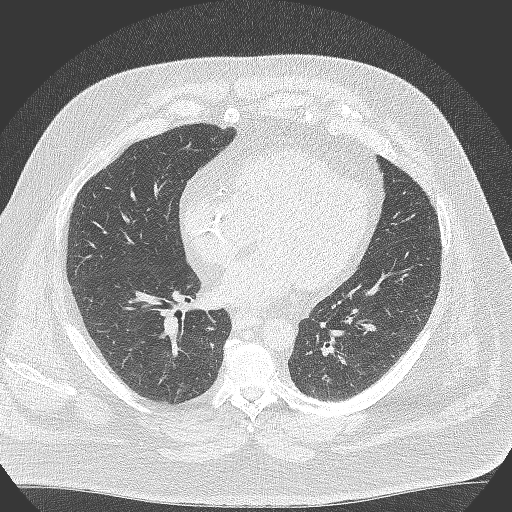
[im 63/141  lung]
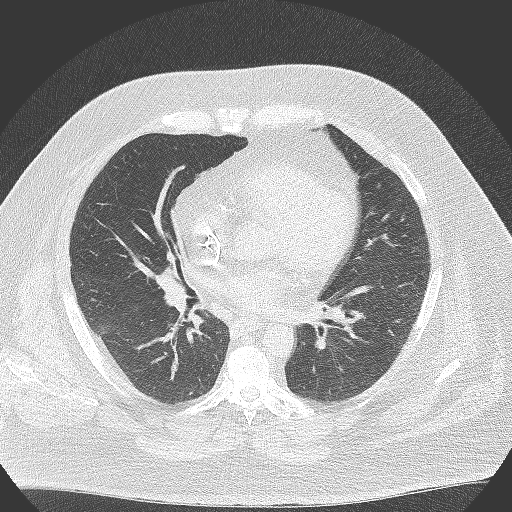
[im 73/141  lung]
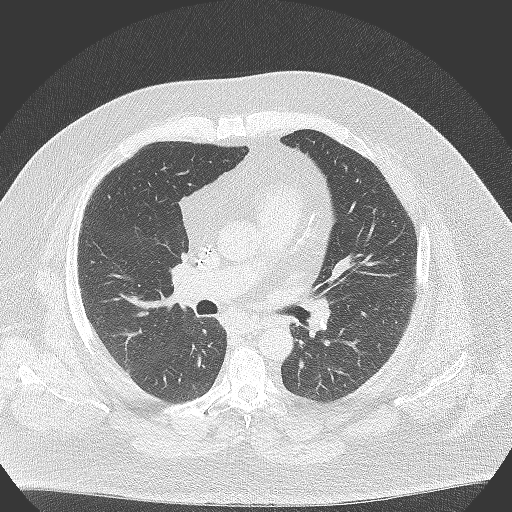
[im 78/141  mediastinal]
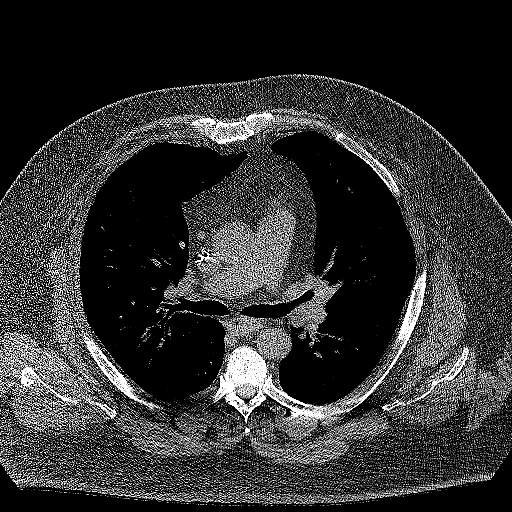
[im 78/141  lung]
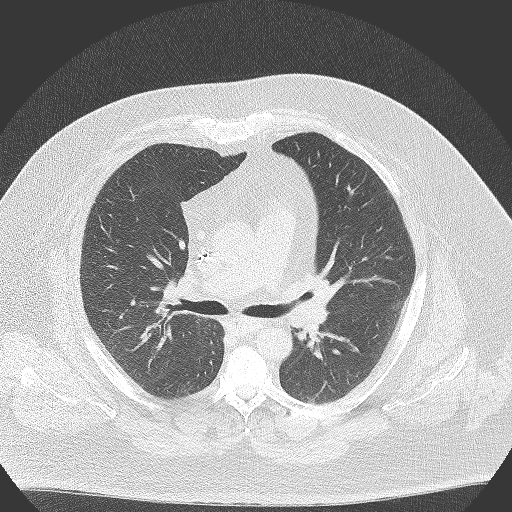
[im 89/141  lung]
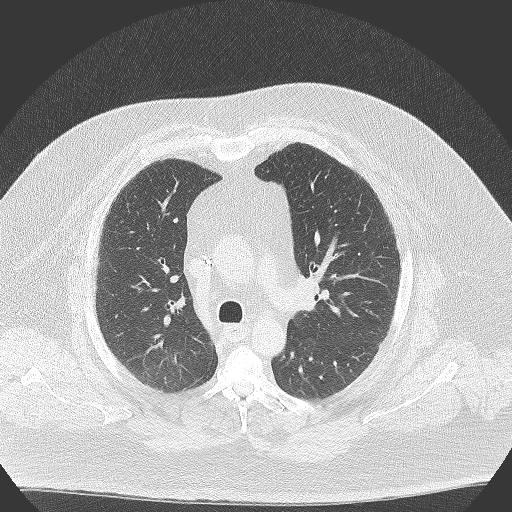
[im 99/141  lung]
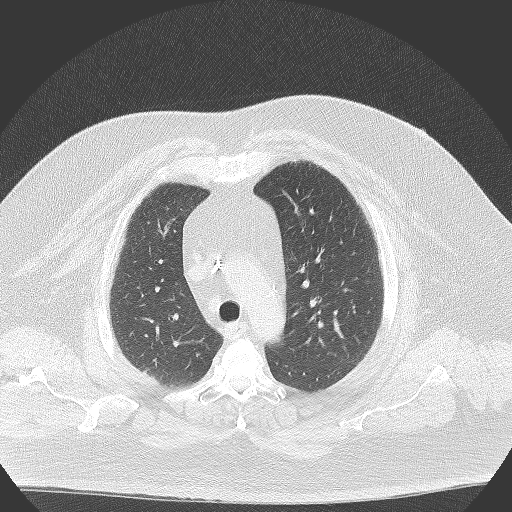
[im 104/141  lung]
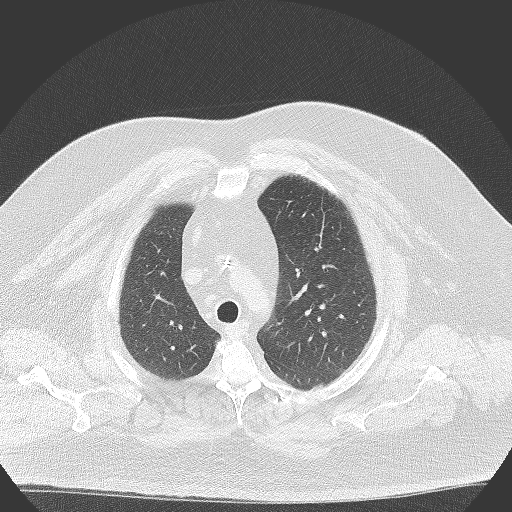
[im 115/141  mediastinal]
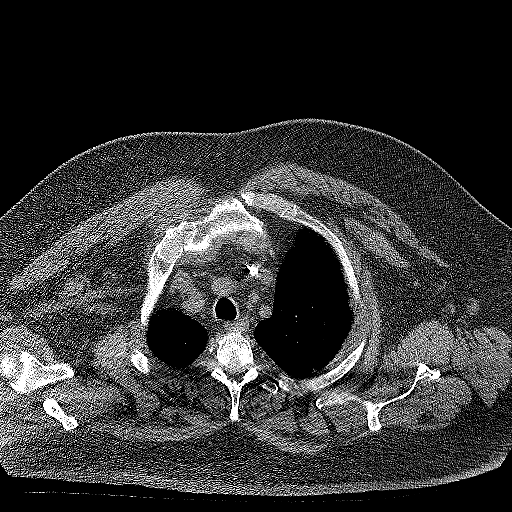
[im 115/141  lung]
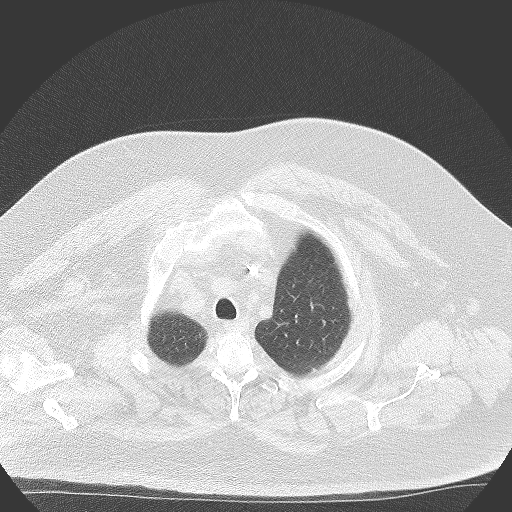
[im 125/141  lung]
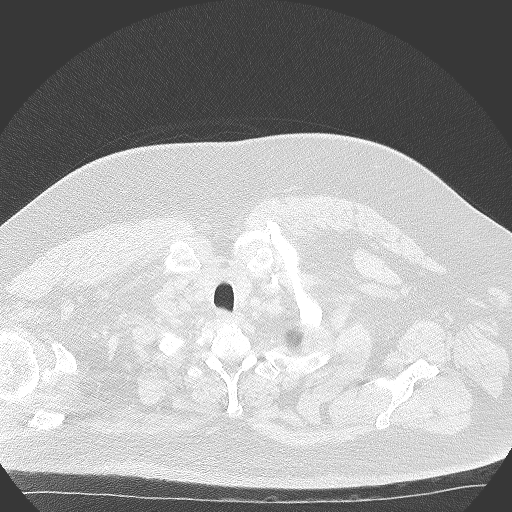
[im 135/141  lung]
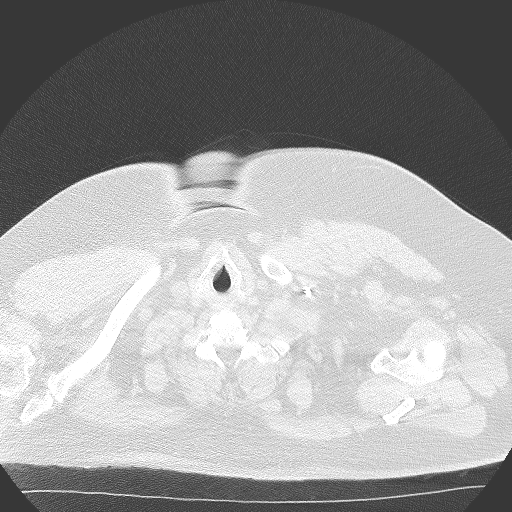

[15 of 32 positions shown; findings below may reference images not displayed]

FINDINGS: Cardiovascular: Atherosclerotic calcification of the aorta and
coronary arteries. Heart is enlarged. No pericardial effusion.

Mediastinum/Nodes: Mediastinal lymph nodes are not enlarged by CT
size criteria. Calcified lymph nodes in the left hilum. Hilar
regions are otherwise difficult to definitively evaluate without IV
contrast. No axillary adenopathy. Esophagus is grossly unremarkable.

Lungs/Pleura: Smudgy nodule in the right lower lobe measures 8 mm (7
x 9 mm, 4/86), unchanged from 06/03/2017. 2 mm apical left upper
lobe nodule ([DATE]), likely a benign subpleural lymph node. Calcified
granulomas in the left lower lobe. No pleural fluid. Airway is
unremarkable.

Upper Abdomen: Visualized portions of the liver, gallbladder,
adrenal glands, kidneys, spleen, pancreas, stomach and bowel are
grossly unremarkable. Upper abdominal lymph nodes measure up to 11
mm, as before.

Musculoskeletal: Degenerative changes in the spine. No worrisome
lytic or sclerotic lesions.
IMPRESSION: 1. No worrisome pulmonary nodules.
2. Aortic atherosclerosis (18I2D-170.0). Coronary artery
calcification.

## 2021-05-15 MED ORDER — TRELEGY ELLIPTA 200-62.5-25 MCG/ACT IN AEPB: 1.0000 | INHALATION_SPRAY | Freq: Every day | RESPIRATORY_TRACT | 11 refills | Status: DC

## 2021-05-15 MED ORDER — PREDNISONE 10 MG PO TABS: ORAL_TABLET | ORAL | 0 refills | Status: AC

## 2021-05-15 NOTE — Progress Notes (Signed)
   Subjective:    Patient ID: Timothy Thomas, male    DOB: [birth year], 55 y.o.   MRN: [id number]  HPI  55 yo morbidly obese smoker followed for chronic cough, lung nodule, and COPD Has OSA on CPAP .  Surprisingly PFTs have not shown airway obstruction. recurrent exacerbations especially triggers being smells and dust exposure seem to resemble asthma rather than COPD.  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     Last OV 12/2020 >> trial of trelegy, this is worked really well for him he tolerated this well and in fact his breathing felt a lot better.  He was able to cut out budesonide and Atrovent nebs and has rarely used albuterol. He called in with chest tightness and wheezing starting with a chest cold on 11/30 and a prednisone taper was called in.  He is feeling better already.  His Coumadin level was fluctuating and he underwent CT chest [facility] which we reviewed   Significant tests/ events reviewed  12/2020 RAST Low grade to cat/dog dander & trees Ig E 594 Eos 400  Eos 02/2020 500 Alpha 1 07/2018 164   Spirometry 05/2017 >>moderate restriction with ratio of 83, FEV1 of 70% and FVC of 65%.   CT Angio of 09/2016 shows right lower lobe 1.0 cm nodule that is stable on follow-up CT in 03/2017, left lower lobe nodule is calcified and noted to be present in the past   CT chest 05/2017 stale 74mm RLL nodule 03/2018 CT chest- stable right lower lobe nodule 6 x 5 mm groundglass, has been noted since 2018 and has been stable, calcified granuloma left lung   CT chest 03/2019  stable right lower lobe nodule  CTA chest 04/2021 no PE, no lung nodules   CPAP titration 11/2017 severe sleep apnea AHI 99/an hour, SP O2 low 83%, optimal control CPAP 13 cm H2O  Review of Systems  neg for any significant sore throat, dysphagia, itching, sneezing, nasal congestion or excess/ purulent secretions, fever, chills, sweats, unintended wt loss, pleuritic or exertional cp, hempoptysis, orthopnea pnd or change in chronic leg swelling. Also denies presyncope, palpitations, heartburn, abdominal pain, nausea, vomiting, diarrhea or change in bowel or urinary habits, dysuria,hematuria, rash, arthralgias, visual complaints, headache, numbness weakness or ataxia.     Objective:   Physical Exam  Gen. Pleasant, obese, in no distress ENT - no lesions, no post nasal drip Neck: No JVD, no thyromegaly, no carotid bruits Lungs: no use of accessory muscles, no dullness to percussion, decreased without rales or rhonchi  Cardiovascular: Rhythm regular, heart sounds  normal, no murmurs or gallops, no peripheral edema Musculoskeletal: No deformities, no cyanosis or clubbing , no tremors       Assessment & Plan:

## 2021-05-15 NOTE — Assessment & Plan Note (Signed)
Smoking cessation again emphasized is the most important intervention that would add years to his life 

## 2021-05-15 NOTE — Assessment & Plan Note (Signed)
CPAP download was reviewed which shows excellent control of events on auto settings 12 to 18 cm with average pressure of 15 cm. Compliance is good and CPAP is certainly helped improve his daytime somnolence and fatigue  Weight loss encouraged, compliance with goal of at least 4-6 hrs every night is the expectation. Advised against medications with sedative side effects Cautioned against driving when sleepy - understanding that sleepiness will vary on a day to day basis

## 2021-05-15 NOTE — Patient Instructions (Signed)
X Refill on prednisone taper  CPAP is working well  Refills  on trelegy

## 2021-05-15 NOTE — Assessment & Plan Note (Signed)
Feel that he has more asthma physiology with good response to prednisone.  He has had an excellent response to Trelegy and no significant flares in the last 4 months. As such we will hold off on initiating biologic. Labs reviewed and with his RAST, high IgE levels and high eosinophils, he will qualify for biologic should he have more frequent flares requiring prednisone  Refills will be provided on Trelegy 200. I will also keep a prescription for prednisone pending in his pharmacy given that his flares are sudden

## 2021-06-10 ENCOUNTER — Telehealth: Payer: Self-pay

## 2021-06-10 NOTE — Telephone Encounter (Signed)
Metformin   [facility] in [place]

## 2021-06-11 ENCOUNTER — Other Ambulatory Visit: Payer: Self-pay | Admitting: Nurse Practitioner

## 2021-06-11 DIAGNOSIS — E119 Type 2 diabetes mellitus without complications: Secondary | ICD-10-CM

## 2021-06-11 MED ORDER — METFORMIN HCL 500 MG PO TABS: 500.0000 mg | ORAL_TABLET | Freq: Two times a day (BID) | ORAL | 1 refills | Status: DC

## 2021-06-11 NOTE — Telephone Encounter (Signed)
The refill has been sent. Thanks  ° °

## 2021-07-15 ENCOUNTER — Encounter: Payer: Self-pay | Admitting: Internal Medicine

## 2021-08-17 ENCOUNTER — Other Ambulatory Visit: Payer: Self-pay | Admitting: Pulmonary Disease

## 2021-10-03 ENCOUNTER — Encounter: Payer: Self-pay | Admitting: Nurse Practitioner

## 2021-10-03 ENCOUNTER — Ambulatory Visit: Payer: Medicare HMO | Admitting: Nurse Practitioner

## 2021-10-03 ENCOUNTER — Ambulatory Visit (INDEPENDENT_AMBULATORY_CARE_PROVIDER_SITE_OTHER): Payer: Medicare HMO | Admitting: Nurse Practitioner

## 2021-10-03 VITALS — BP 135/79 | HR 62 | Temp 98.4°F | Ht 70.0 in | Wt 360.0 lb

## 2021-10-03 DIAGNOSIS — E119 Type 2 diabetes mellitus without complications: Secondary | ICD-10-CM

## 2021-10-03 LAB — POCT GLYCOSYLATED HEMOGLOBIN (HGB A1C)
HbA1c POC (<> result, manual entry): 6.2 % (ref 4.0–5.6)
HbA1c, POC (controlled diabetic range): 6.2 % (ref 0.0–7.0)
HbA1c, POC (prediabetic range): 6.2 % (ref 5.7–6.4)
Hemoglobin A1C: 6.2 % — AB (ref 4.0–5.6)

## 2021-10-03 NOTE — Progress Notes (Signed)
? ?[facility] ?[address] ?[place], [place]  [address] ?Phone:  [phone]   Fax:  [phone] ?Subjective:  ? Patient ID: Timothy Thomas, male    DOB: 1965-06-29, 56 y.o.   MRN: [id number] ? ?Chief Complaint  ?Patient presents with  ? Follow-up  ?  Pt is here for 3 months follow up visit.  ? ?HPI ?Timothy Thomas 56 y.o. male  has a past medical history of Aphasia, COPD (chronic obstructive pulmonary disease) (HCC), Glaucoma, Macular degeneration, Myocardial infarction (HCC), Obesity, Obstructive sleep apnea, Sleep apnea, Tachycardia-bradycardia syndrome (HCC), Urinary retention (08/2019), Variant angina (HCC), and Vasovagal syncope. To the [facility] for reevaluation of DM2. ? ?Diabetes Mellitus: Patient presents for follow up of diabetes. Symptoms: none. Denies any symptoms. Patient denies none.  Evaluation to date has been included: hemoglobin A1C.  Home sugars: patient does not check sugars. Treatment to date: no recent interventions.   ? ?Denies any other concerns today. Denies any fatigue, chest pain, shortness of breath, HA or dizziness. Denies any blurred vision, numbness or tingling. ? ?Past Medical History:  ?Diagnosis Date  ? Aphasia   ? Transient - negative head CT and EEG with neurology workup February 2017 [facility])  ? COPD (chronic obstructive pulmonary disease) (HCC)   ? Glaucoma   ? Macular degeneration   ? Myocardial infarction [facility])   ? Obesity   ? Obstructive sleep apnea   ? Sleep apnea   ? Tachycardia-bradycardia syndrome (HCC)   ? Status post pacemaker  ? Urinary retention 08/2019  ? Variant angina (HCC)   ? History of normal coronary arteries at cardiac catheterization 2002 - vasospasm noted  ? Vasovagal syncope   ? ? ?Past Surgical History:  ?Procedure Laterality Date  ? LEFT HEART CATH AND CORONARY ANGIOGRAPHY N/A 09/28/2016  ? Procedure: Left Heart Cath and Coronary Angiography;  Surgeon: Orpah Cobb, MD;  Location: [facility] INVASIVE CV LAB;  Service: Cardiovascular;   Laterality: N/A;  ? PACEMAKER INSERTION    ? ? ?Family History  ?Problem Relation Age of Onset  ? Hypertension Mother   ? Diabetes Father   ? Hypertension Father   ? Stroke Father   ? ? ?Social History  ? ?Socioeconomic History  ? Marital status: Married  ?  Spouse name: Not on file  ? Number of children: Not on file  ? Years of education: Not on file  ? Highest education level: Not on file  ?Occupational History  ? Not on file  ?Tobacco Use  ? Smoking status: Every Day  ?  Packs/day: 0.25  ?  Years: 40.00  ?  Pack years: 10.00  ?  Types: Cigarettes  ?  Start date: 09/26/1976  ? Smokeless tobacco: Never  ? Tobacco comments:  ?  currently smoking 1/2ppd  ?Vaping Use  ? Vaping Use: Never used  ?Substance and Sexual Activity  ? Alcohol use: No  ? Drug use: No  ? Sexual activity: Yes  ?Other Topics Concern  ? Not on file  ?Social History Narrative  ? Not on file  ? ?Social Determinants of Health  ? ?Financial Resource Strain: Low Risk   ? Difficulty of Paying Living Expenses: Not hard at all  ?Food Insecurity: No Food Insecurity  ? Worried About Programme researcher, broadcasting/film/video in the Last Year: Never true  ? Ran Out of Food in the Last Year: Never true  ?Transportation Needs: No Transportation Needs  ? Lack of Transportation (Medical): No  ?  Lack of Transportation (Non-Medical): No  ?Physical Activity: Insufficiently Active  ? Days of Exercise per Week: 2 days  ? Minutes of Exercise per Session: 10 min  ?Stress: Stress Concern Present  ? Feeling of Stress : Very much  ?Social Connections: Moderately Integrated  ? Frequency of Communication with Friends and Family: More than three times a week  ? Frequency of Social Gatherings with Friends and Family: More than three times a week  ? Attends Religious Services: More than 4 times per year  ? Active Member of Clubs or Organizations: No  ? Attends Banker Meetings: Never  ? Marital Status: Married  ?Intimate Partner Violence: Not At Risk  ? Fear of Current or Ex-Partner: No   ? Emotionally Abused: No  ? Physically Abused: No  ? Sexually Abused: No  ? ? ?Outpatient Medications Prior to Visit  ?Medication Sig Dispense Refill  ? albuterol (PROVENTIL) (2.5 MG/3ML) 0.083% nebulizer solution USE THREE MILLILITERS VIA NEBULIZATION BY MOUTH EVERY 6 HOURS AS NEEDED FOR WHEEZING OR FOR SHORTNESS OF BREATH 375 mL 9  ? albuterol (VENTOLIN HFA) 108 (90 Base) MCG/ACT inhaler INHALE TWO PUFFS BY MOUTH EVERY 6 HOURS AS NEEDED FOR SHORTNESS OF BREATH AND FOR WHEEZING 18 g 9  ? aspirin EC 81 MG tablet Take 81 mg by mouth daily.    ? atorvastatin (LIPITOR) 40 MG tablet TAKE ONE TABLET BY MOUTH EVERY EVENING AT 6PM 90 tablet 3  ? cetirizine (ZYRTEC) 10 MG tablet Take 10 mg by mouth daily.    ? chlorpheniramine (CHLOR-TRIMETON) 4 MG tablet Take 4 mg by mouth 2 (two) times daily as needed for allergies.    ? diphenhydramine-acetaminophen (TYLENOL PM) 25-500 MG TABS tablet Take 3 tablets by mouth at bedtime.     ? Docusate Sodium (COLACE PO) Take by mouth.    ? Fluticasone-Umeclidin-Vilant (TRELEGY ELLIPTA) 200-62.5-25 MCG/ACT AEPB Inhale 1 puff into the lungs daily. 60 each 11  ? Fluticasone-Umeclidin-Vilant (TRELEGY ELLIPTA) 200-62.5-25 MCG/INH AEPB Inhale 1 puff into the lungs daily. 60 each 11  ? furosemide (LASIX) 20 MG tablet 1-2 tabs daily for leg swelling 90 tablet 3  ? metFORMIN (GLUCOPHAGE) 500 MG tablet Take 1 tablet (500 mg total) by mouth 2 (two) times daily with a meal. 180 tablet 1  ? omeprazole (PRILOSEC) 20 MG capsule TAKE ONE CAPSULE BY MOUTH DAILY 30 capsule 6  ? traZODone (DESYREL) 100 MG tablet TAKE ONE TABLET BY MOUTH EVERY NIGHT AT BEDTIME AS NEEDED FOR SLEEP 30 tablet 6  ? warfarin (COUMADIN) 5 MG tablet Take 10 mg by mouth daily.    ? ?No facility-administered medications prior to visit.  ? ? ?Allergies  ?Allergen Reactions  ? Codeine Nausea And Vomiting  ?  Low tolerance to narcotics  ? Ketorolac Nausea And Vomiting  ?  Vomiting  ?  ? ? ?Review of Systems  ?Constitutional:  Negative  for chills, fever and malaise/fatigue.  ?Eyes: Negative.   ?Respiratory:  Negative for cough and shortness of breath.   ?Cardiovascular:  Negative for chest pain, palpitations and leg swelling.  ?Gastrointestinal:  Negative for abdominal pain, blood in stool, constipation, diarrhea, nausea and vomiting.  ?Musculoskeletal: Negative.   ?Skin: Negative.   ?Neurological: Negative.   ?Psychiatric/Behavioral:  Negative for depression. The patient is not nervous/anxious.   ?All other systems reviewed and are negative. ? ?   ?Objective:  ?  ?Physical Exam ?Vitals reviewed.  ?Constitutional:   ?   General: He is not in  acute distress. ?   Appearance: Normal appearance. He is obese.  ?HENT:  ?   Head: Normocephalic.  ?Cardiovascular:  ?   Rate and Rhythm: Normal rate and regular rhythm.  ?   Pulses: Normal pulses.  ?   Heart sounds: Normal heart sounds.  ?   Comments: No obvious peripheral edema ?Pulmonary:  ?   Effort: Pulmonary effort is normal.  ?   Breath sounds: Normal breath sounds.  ?Musculoskeletal:     ?   General: No swelling, tenderness, deformity or signs of injury. Normal range of motion.  ?   Right lower leg: No edema.  ?   Left lower leg: No edema.  ?Skin: ?   General: Skin is warm and dry.  ?   Capillary Refill: Capillary refill takes less than 2 seconds.  ?Neurological:  ?   General: No focal deficit present.  ?   Mental Status: He is alert and oriented to person, place, and time.  ?Psychiatric:     ?   Mood and Affect: Mood normal.     ?   Behavior: Behavior normal.     ?   Thought Content: Thought content normal.     ?   Judgment: Judgment normal.  ? ? ?BP 135/79 (BP Location: Right Arm, Patient Position: Sitting, Cuff Size: Large)   Pulse 62   Temp 98.4 ?F (36.9 ?C)   Ht 5\' 10"  (1.778 m)   Wt (!) 360 lb (163.3 kg)   SpO2 94%   BMI 51.65 kg/m?  ?Wt Readings from Last 3 Encounters:  ?10/03/21 (!) 360 lb (163.3 kg)  ?05/15/21 (!) 359 lb 0.6 oz (162.9 kg)  ?04/04/21 (!) 356 lb 12.8 oz (161.8 kg)   ? ? ?Immunization History  ?Administered Date(s) Administered  ? Influenza,inj,Quad PF,6+ Mos 02/09/2017, 04/06/2018, 03/29/2019, 03/28/2020, 04/04/2021  ? PFIZER(Purple Top)SARS-COV-2 Vaccination 08/18/2019, 03/

## 2021-10-03 NOTE — Patient Instructions (Signed)
You were seen today in the [facility] for reevaluation of DM. Labs were collected, results will be available via MyChart or, if abnormal, you will be contacted by clinic staff.  Please follow up in 3 mths for reevaluation of DM2 ?

## 2021-10-15 ENCOUNTER — Other Ambulatory Visit: Payer: Self-pay | Admitting: Nurse Practitioner

## 2021-10-15 DIAGNOSIS — I5189 Other ill-defined heart diseases: Secondary | ICD-10-CM

## 2021-10-17 ENCOUNTER — Other Ambulatory Visit: Payer: Self-pay | Admitting: Nurse Practitioner

## 2021-10-17 DIAGNOSIS — E785 Hyperlipidemia, unspecified: Secondary | ICD-10-CM

## 2021-10-28 ENCOUNTER — Encounter: Payer: Self-pay | Admitting: Pulmonary Disease

## 2021-10-28 ENCOUNTER — Ambulatory Visit: Payer: Medicare HMO | Admitting: Pulmonary Disease

## 2021-10-28 ENCOUNTER — Encounter: Payer: Self-pay | Admitting: Gastroenterology

## 2021-10-28 ENCOUNTER — Ambulatory Visit (INDEPENDENT_AMBULATORY_CARE_PROVIDER_SITE_OTHER): Payer: Medicare HMO | Admitting: Gastroenterology

## 2021-10-28 VITALS — BP 138/88 | HR 80 | Temp 97.7°F | Ht 70.0 in | Wt 362.4 lb

## 2021-10-28 VITALS — BP 117/75 | HR 73 | Temp 97.7°F | Ht 70.0 in | Wt 361.6 lb

## 2021-10-28 DIAGNOSIS — G4733 Obstructive sleep apnea (adult) (pediatric): Secondary | ICD-10-CM | POA: Diagnosis not present

## 2021-10-28 DIAGNOSIS — Z1211 Encounter for screening for malignant neoplasm of colon: Secondary | ICD-10-CM

## 2021-10-28 DIAGNOSIS — Z9989 Dependence on other enabling machines and devices: Secondary | ICD-10-CM | POA: Diagnosis not present

## 2021-10-28 DIAGNOSIS — Z7901 Long term (current) use of anticoagulants: Secondary | ICD-10-CM

## 2021-10-28 DIAGNOSIS — J449 Chronic obstructive pulmonary disease, unspecified: Secondary | ICD-10-CM

## 2021-10-28 DIAGNOSIS — I2699 Other pulmonary embolism without acute cor pulmonale: Secondary | ICD-10-CM

## 2021-10-28 DIAGNOSIS — Z720 Tobacco use: Secondary | ICD-10-CM | POA: Diagnosis not present

## 2021-10-28 MED ORDER — MONTELUKAST SODIUM 10 MG PO TABS
10.0000 mg | ORAL_TABLET | Freq: Every day | ORAL | 3 refills | Status: DC
Start: 2021-10-28 — End: 2023-09-27

## 2021-10-28 MED ORDER — PREDNISONE 10 MG PO TABS: ORAL_TABLET | ORAL | 0 refills | Status: AC

## 2021-10-28 NOTE — Patient Instructions (Signed)
We will get you scheduled for a colonoscopy once you have seen your pulmonologist and I have communicated with your hematologist regarding management of your coumadin/Lovenox bridge.

## 2021-10-28 NOTE — Progress Notes (Signed)
GI Office Note    Referring Provider: Vevelyn Francois, NP Primary Care Physician:  Vevelyn Francois, NP  Primary Gastroenterologist: Garfield Cornea, MD   Chief Complaint   Chief Complaint  Patient presents with   Colonoscopy    No other GI related issues.      History of Present Illness   Timothy Thomas is a 56 y.o. male presenting today for consideration of colonoscopy at the request of Dionisio David, NP.  No prior colonoscopy. Last month asthma/copd has been miserable but prior to that fine. States he was diagnosed with COPD after having MI/PE in 2018. Symptoms had been stable until Spring. Since then has felt more SOB. Has been using nebulizers, albuterol. Has to take time doing everything or he gets winded. Trilegy has helped some. Was taking 9 breathing treatments per day. Follows with pulmonology. Has felt that symptoms mostly due to asthma as PFTs have not shown airway obstruction.   Notably, patient had submassive PE in 09/2016. Discharged on Xarelto at that time. Developed another PE 03/2017 while on Xarelto and since then has been on coumadin, followed by hematology at [facility]. 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 Last seen by cardiologist 2020. Was advised no need for follow up.   Patient states he is on omeprazole for pulmonary reasons. Has been on it for couple of years in case gerd contributing to his asthma. Denies heartburn, dysphagia. BM regular. Takes colace and fiber every day. No melena, brbpr. Avoids oral iron due to severe constipation, requiring hospitalization in 2019. Per hematology note, iron saturations were low. Patient denies having IV iron in the past.   11/01/21: INR 2.79. Goes back 5/31  Labs from 06/2021: WBC 12,200, Hgb 12.5, platelets 285000.  Labs from 03/2021: iron 36, iron sat 10%, tibc 350 Labs from 11/2020: ferritin 21.8  Medications   Current Outpatient Medications  Medication Sig Dispense  Refill   albuterol (PROVENTIL) (2.5 MG/3ML) 0.083% nebulizer solution USE THREE MILLILITERS VIA NEBULIZATION BY MOUTH EVERY 6 HOURS AS NEEDED FOR WHEEZING OR FOR SHORTNESS OF BREATH 375 mL 9   albuterol (VENTOLIN HFA) 108 (90 Base) MCG/ACT inhaler INHALE TWO PUFFS BY MOUTH EVERY 6 HOURS AS NEEDED FOR SHORTNESS OF BREATH AND FOR WHEEZING 18 g 9   aspirin EC 81 MG tablet Take 81 mg by mouth daily.     atorvastatin (LIPITOR) 40 MG tablet TAKE ONE TABLET BY MOUTH EVERY EVENING AT 6PM 90 tablet 3   cetirizine (ZYRTEC) 10 MG tablet Take 10 mg by mouth daily.     chlorpheniramine (CHLOR-TRIMETON) 4 MG tablet Take 4 mg by mouth 2 (two) times daily as needed for allergies.     diphenhydramine-acetaminophen (TYLENOL PM) 25-500 MG TABS tablet Take 3 tablets by mouth at bedtime.      Docusate Sodium (COLACE PO) Take by mouth.     Fluticasone-Umeclidin-Vilant (TRELEGY ELLIPTA) 200-62.5-25 MCG/ACT AEPB Inhale 1 puff into the lungs daily. 60 each 11   furosemide (LASIX) 20 MG tablet TAKE ONE TO TWO TABLETS BY MOUTH DAILY FOR LEG SWELLING 90 tablet 3   metFORMIN (GLUCOPHAGE) 500 MG tablet Take 1 tablet (500 mg total) by mouth 2 (two) times daily with a meal. 180 tablet 1   omeprazole (PRILOSEC) 20 MG capsule TAKE ONE CAPSULE BY MOUTH DAILY 30 capsule 6   traZODone (DESYREL) 100 MG tablet TAKE ONE TABLET BY MOUTH EVERY NIGHT AT BEDTIME AS NEEDED FOR SLEEP 30  tablet 6   warfarin (COUMADIN) 5 MG tablet Take 14 mg by mouth daily.     No current facility-administered medications for this visit.    Allergies   Allergies as of 10/28/2021 - Review Complete 10/28/2021  Allergen Reaction Noted   Codeine Nausea And Vomiting 02/05/2016   Ketorolac Nausea And Vomiting 02/05/2016    Past Medical History   Past Medical History:  Diagnosis Date   Aphasia    Transient - negative head CT and EEG with neurology workup February 2017 [facility])   COPD (chronic obstructive pulmonary disease) ([place])    Glaucoma    Macular  degeneration    Myocardial infarction ([place])    Obesity    Obstructive sleep apnea    Sleep apnea    Tachycardia-bradycardia syndrome ([place])    Status post pacemaker   Urinary retention 08/2019   Variant angina (HCC)    History of normal coronary arteries at cardiac catheterization 2002 - vasospasm noted   Vasovagal syncope     Past Surgical History   Past Surgical History:  Procedure Laterality Date   LEFT HEART CATH AND CORONARY ANGIOGRAPHY N/A 09/28/2016   Procedure: Left Heart Cath and Coronary Angiography;  Surgeon: Dixie Dials, MD;  Location: [place] CV LAB;  Service: Cardiovascular;  Laterality: N/A;   PACEMAKER INSERTION      Past Family History   Family History  Problem Relation Age of Onset   Hypertension Mother    Diabetes Father    Hypertension Father    Stroke Father     Past Social History   Social History   Socioeconomic History   Marital status: Married    Spouse name: Not on file   Number of children: Not on file   Years of education: Not on file   Highest education level: Not on file  Occupational History   Not on file  Tobacco Use   Smoking status: Every Day    Packs/day: 0.25    Years: 40.00    Pack years: 10.00    Types: Cigarettes    Start date: 09/26/1976   Smokeless tobacco: Never   Tobacco comments:    currently smoking 1/2ppd  Vaping Use   Vaping Use: Never used  Substance and Sexual Activity   Alcohol use: No   Drug use: No   Sexual activity: Yes  Other Topics Concern   Not on file  Social History Narrative   Not on file   Social Determinants of Health   Financial Resource Strain: Low Risk    Difficulty of Paying Living Expenses: Not hard at all  Food Insecurity: No Food Insecurity   Worried About Charity fundraiser in the Last Year: Never true   [place] in the Last Year: Never true  Transportation Needs: No Transportation Needs   Lack of Transportation (Medical): No   Lack of Transportation (Non-Medical):  No  Physical Activity: Insufficiently Active   Days of Exercise per Week: 2 days   Minutes of Exercise per Session: 10 min  Stress: Stress Concern Present   Feeling of Stress : Very much  Social Connections: Moderately Integrated   Frequency of Communication with Friends and Family: More than three times a week   Frequency of Social Gatherings with Friends and Family: More than three times a week   Attends Religious Services: More than 4 times per year   Active Member of [employer] or Organizations: No   Attends Archivist Meetings: Never  Marital Status: Married  Human resources officer Violence: Not At Risk   Fear of Current or Ex-Partner: No   Emotionally Abused: No   Physically Abused: No   Sexually Abused: No    Review of Systems   General: Negative for anorexia, weight loss, fever, chills, fatigue, weakness. Eyes: Negative for vision changes.  ENT: Negative for hoarseness, difficulty swallowing , nasal congestion. CV: Negative for chest pain, angina, palpitations, +dyspnea on exertion, +peripheral edema.  Respiratory: Negative for dyspnea at rest, +dyspnea on exertion, +cough. denies sputum, wheezing.  GI: See history of present illness. GU:  Negative for dysuria, hematuria, urinary incontinence, urinary frequency, nocturnal urination.  MS: Negative for joint pain, low back pain.  Derm: Negative for rash or itching.  Neuro: Negative for weakness, abnormal sensation, seizure, frequent headaches, memory loss,  confusion.  Psych: Negative for anxiety, depression, suicidal ideation, hallucinations.  Endo: Negative for unusual weight change.  Heme: Negative for bruising or bleeding. Allergy: Negative for rash or hives.  Physical Exam   BP 117/75 (BP Location: Left Arm, Patient Position: Sitting, Cuff Size: Large)   Pulse 73   Temp 97.7 F (36.5 C) (Temporal)   Ht 5\' 10"  (1.778 m)   Wt (!) 361 lb 9.6 oz (164 kg)   SpO2 92%   BMI 51.88 kg/m    General: Well-nourished,  well-developed in no acute distress. Obese.  Head: Normocephalic, atraumatic.   Eyes: Conjunctiva pink, no icterus. Mouth: Oropharyngeal mucosa moist and pink , no lesions erythema or exudate. Neck: Supple without thyromegaly, masses, or lymphadenopathy.  Lungs: Scattered wheezes bilaterally. Pacemaker in the left upper chest. Heart: Regular rate and rhythm, no murmurs rubs or gallops.  Abdomen: Bowel sounds are normal, nontender, nondistended, no hepatosplenomegaly or masses,  no abdominal bruits or hernia, no rebound or guarding.  Exam limited due to body habitus. Rectal: not performed.  Extremities: trace bilateral lower extremity edema. No clubbing or deformities.  Neuro: Alert and oriented x 4 , grossly normal neurologically.  Skin: Warm and dry, no rash or jaundice.   Psych: Alert and cooperative, normal mood and affect.  Labs   See hpi  Imaging Studies   No results found.  Assessment   Encounter for screening colonoscopy: no prior colonoscopy. No FH of CRC. Denies any lower GI symptoms. His Hgb and ferritin have been normal. His serum iron has been slightly low. No overt GI bleeding. He is chronically anticoagulated for history of PEs.   He has had breathing issues over past few months, better on Trelegy. Follows with his pulmonologist today. Patient advised to discuss plans for colonoscopy with pulmonary and the following was noted in today's ov note by Dr. Elsworth Soho. "Preop assessment -colonoscopy has been advised.  This can be performed with due risk.  Clearly he has severe OSA and would need routine precautions for this condition in terms of anticipating difficult airway, oxygen monitoring during and postprocedure.  CPAP can be used during recovery"  PLAN   Colonoscopy to be scheduled in the near future. ASA 3. Will discuss Lovenox bridge with hematology and then schedule procedure.  I have discussed the risks, alternatives, benefits with regards to but not limited to the risk of  reaction to medication, bleeding, infection, perforation and the patient is agreeable to proceed. Written consent to be obtained.    Laureen Ochs. Bobby Rumpf, [place], [facility]

## 2021-10-28 NOTE — Patient Instructions (Addendum)
  X Rx for singulair 10 mg at bedtime x 1 month x 3 refills  X Rx for Prednisone 10 mg tabs Take 4 tabs  daily with food x 4 days, then 3 tabs daily x 4 days, then 2 tabs daily x 4 days, then 1 tab daily x4 days then stop. #40  CPAP is working well, renew supplies Nebuliser supplies Cleared for colonoscopy with due risk

## 2021-10-28 NOTE — Assessment & Plan Note (Signed)
We will keep a prescription for prednisone pending in his pharmacy since his flares are sudden. We discussed long-term side effects of prednisone and he will only take if needed. Trelegy has worked very well overall and I do not feel like he needs a biologic at this time. For seasonal allergies, we will add Singulair in addition to antihistaminic that he is already on

## 2021-10-28 NOTE — Progress Notes (Signed)
Subjective:    Patient ID: Timothy Thomas, male    DOB: 1966/01/01, 57 y.o.   MRN: [id number]  HPI  56 yo morbidly obese smoker for FU of chronic cough, lung nodule, and asthma/COPD Has OSA on CPAP .  Surprisingly PFTs have not shown airway obstruction. recurrent exacerbations especially triggers being smells and dust exposure seem to resemble asthma rather than COPD.  He responds very well to steroid tapers, overall seems to have asthma physiology  Recent flares 04/2020, 08/2020, 11/2020 -flares are sudden   PMH - sub massive PE 09/2016 .He was discharged on Xarelto but  developed another segmental PE in the left lower lobe in 03/2017 while on Xarelto >> hence on coumadin  He has a non functional pacemaker inserted in 1994  for a 10-second bradycardia     Chief Complaint  Patient presents with   Follow-up    CPAP working well.  Needs supplies and update on pressures sent to dme    19-month follow-up visit He was evaluated by GI today for colonoscopy and needs pulmonary clearance. He walked in and tolerate about of coughing due to exposure to odor. Overall has done well in the past 6 months without major flareup requiring prednisone.  Compliant with Trelegy and uses albuterol as needed Pollen season has been bad in spite of taking Zyrtec daily He continues to smoke less than half pack per day  He is very compliant with his CPAP machine and denies problems with mask or pressure   Significant tests/ events reviewed  12/2020 RAST Low grade to cat/dog dander & trees Ig E 594 Eos 400   Eos 02/2020 500 Alpha 1 07/2018 164   Spirometry 05/2017 >>moderate restriction with ratio of 83, FEV1 of 70% and FVC of 65%.   CT Angio of 09/2016 shows right lower lobe 1.0 cm nodule that is stable on follow-up CT in 03/2017, left lower lobe nodule is calcified and noted to be present in the past   CT chest 05/2017 stale 67mm RLL nodule 03/2018 CT chest- stable right lower lobe nodule 6 x 5 mm  groundglass, has been noted since 2018 and has been stable, calcified granuloma left lung   CT chest 03/2019 stable right lower lobe nodule   CTA chest 04/2021 no PE, no lung nodules   CPAP titration 11/2017 severe sleep apnea AHI 99/an hour, SP O2 low 83%, optimal control CPAP 13 cm H2O   Review of Systems neg for any significant sore throat, dysphagia, itching, sneezing, nasal congestion or excess/ purulent secretions, fever, chills, sweats, unintended wt loss, pleuritic or exertional cp, hempoptysis, orthopnea pnd or change in chronic leg swelling. Also denies presyncope, palpitations, heartburn, abdominal pain, nausea, vomiting, diarrhea or change in bowel or urinary habits, dysuria,hematuria, rash, arthralgias, visual complaints, headache, numbness weakness or ataxia.     Objective:   Physical Exam  Gen. Pleasant, obese, in no distress, normal affect ENT - no pallor,icterus, no post nasal drip, class 2-3 airway Neck: No JVD, no thyromegaly, no carotid bruits Lungs: no use of accessory muscles, no dullness to percussion, decreased without rales or rhonchi  Cardiovascular: Rhythm regular, heart sounds  normal, no murmurs or gallops, no peripheral edema Abdomen: soft and non-tender, no hepatosplenomegaly, BS normal. Musculoskeletal: No deformities, no cyanosis or clubbing Neuro:  alert, non focal, no tremors       Assessment & Plan:    Preop assessment -colonoscopy has been advised.  This can be performed with due risk.  Clearly he has severe OSA and would need routine precautions for this condition in terms of anticipating difficult airway, oxygen monitoring during and postprocedure.  CPAP can be used during recovery

## 2021-10-28 NOTE — Assessment & Plan Note (Signed)
CPAP download was reviewed which shows excellent compliance more than 6 hours per night with good control of events and mild leak.  He is very compliant and CPAP is only helped improve his daytime somnolence and fatigue.  CPAP supplies will be renewed for a year  Weight loss encouraged, compliance with goal of at least 4-6 hrs every night is the expectation. Advised against medications with sedative side effects Cautioned against driving when sleepy - understanding that sleepiness will vary on a day to day basis

## 2021-10-28 NOTE — Assessment & Plan Note (Signed)
Smoking cessation was again advised.  He was not ready to set a quit date

## 2021-11-04 ENCOUNTER — Telehealth: Payer: Self-pay | Admitting: Gastroenterology

## 2021-11-04 NOTE — Telephone Encounter (Signed)
We need approval or assistance with Lovenox bridge for patient. His coumadin is managed at [facility] in [place].   He needs Lovenox bridge for colonoscopy. Please find out if they will help coordinate for [place]. We typically have coumadin help 4 days before procedure, start lovenox 3 days before procedure, hold lovenox morning of colonoscopy.

## 2021-11-05 NOTE — Telephone Encounter (Signed)
Request for lovenox bridge was faxed to [facility] in [place]. Waiting for response.

## 2021-11-11 NOTE — Telephone Encounter (Signed)
Clearance has been received and is in your box for review.

## 2021-11-11 NOTE — Telephone Encounter (Signed)
Can we follow-up on this?

## 2021-11-12 NOTE — Telephone Encounter (Signed)
Will call pt to schedule once we receive future schedule 

## 2021-11-12 NOTE — Telephone Encounter (Signed)
Received documentation that hematology will manage Lovenox bridge for patient for colonoscopy. They are requesting to schedule procedure and then contact their office so they can notify patient with instructions.   Please schedule colonoscopy with [facility]. ASA 3.  Dx: screening colonoscopy Day of prep: metformin am dose only Am of TCS: hold metformin.  Patient has a nonfunctional pacemaker  PLEASE LET [facility] KNOW DATE WHEN AVAILABLE.   PLEASE REQUEST THEM TO PLAN ON HOLDING COUMADIN FOUR DAYS BEFORE TCS AND DO NOT TAKE LOVENOX THE DAY OF THE PROCEDURE.

## 2021-11-17 ENCOUNTER — Other Ambulatory Visit: Payer: Self-pay | Admitting: Pulmonary Disease

## 2021-11-17 DIAGNOSIS — R053 Chronic cough: Secondary | ICD-10-CM

## 2021-11-27 ENCOUNTER — Telehealth: Payer: Self-pay

## 2021-11-27 NOTE — Telephone Encounter (Signed)
Trazodone  °

## 2021-11-28 ENCOUNTER — Other Ambulatory Visit: Payer: Self-pay | Admitting: Nurse Practitioner

## 2021-11-28 DIAGNOSIS — G4700 Insomnia, unspecified: Secondary | ICD-10-CM

## 2021-11-28 MED ORDER — TRAZODONE HCL 100 MG PO TABS: ORAL_TABLET | ORAL | 6 refills | Status: AC

## 2021-12-04 ENCOUNTER — Encounter: Payer: Self-pay | Admitting: *Deleted

## 2021-12-04 MED ORDER — PEG 3350-KCL-NA BICARB-NACL 420 G PO SOLR: ORAL | 0 refills | Status: DC

## 2021-12-04 NOTE — Telephone Encounter (Signed)
Spoke with pt. He has been scheduled for 8/23 at 8:30am. Aware [facility] will be in contact regarding coumadin/lovenox bridge. I will send information to them. Instructions/pre-op mailed to pt. Rx for prep sent to [facility].

## 2021-12-04 NOTE — Addendum Note (Signed)
Addended by: Armstead Peaks on: 12/04/2021 11:06 AM   Modules accepted: Orders

## 2021-12-18 ENCOUNTER — Other Ambulatory Visit: Payer: Self-pay | Admitting: Nurse Practitioner

## 2021-12-18 DIAGNOSIS — E119 Type 2 diabetes mellitus without complications: Secondary | ICD-10-CM

## 2021-12-24 ENCOUNTER — Telehealth: Payer: Self-pay | Admitting: Pulmonary Disease

## 2021-12-24 NOTE — Telephone Encounter (Signed)
Hey ladies can we run a test ticket on this patient. Patient is stating they are in the donut hole and the Trelegy is costing them too much.  Please advise

## 2021-12-24 NOTE — Telephone Encounter (Signed)
Patient is in the doughnut hole with insurance and trelegy is going up every month until he reaches $600 and he cannot afford that. Patient would like an alternative or assistance.  Please advise, call back [phone].

## 2021-12-29 ENCOUNTER — Other Ambulatory Visit (HOSPITAL_COMMUNITY): Payer: Self-pay

## 2021-12-29 MED ORDER — FLUTICASONE FUROATE-VILANTEROL 100-25 MCG/ACT IN AEPB: 1.0000 | INHALATION_SPRAY | Freq: Every day | RESPIRATORY_TRACT | 2 refills | Status: DC

## 2021-12-29 NOTE — Telephone Encounter (Signed)
Please advise on message from pharmacy team on what is covered for patient and insurance.  Test billing with [facility] pharmacy shows prices as follows:  Trelegy: $126.97   ICS+LABA-  Symbicort (DAW 9): $59.99  Breo: $61.32  Advair (DAW 9): $60.93   LAMA-  Incruse: $50.18   Please advise sir

## 2021-12-29 NOTE — Telephone Encounter (Signed)
Called patient to let him know to trail [facility] for now. I told patient to call office back in regards to if its helpful. Nothing further needed

## 2022-01-02 ENCOUNTER — Ambulatory Visit (INDEPENDENT_AMBULATORY_CARE_PROVIDER_SITE_OTHER): Payer: Medicare HMO | Admitting: Nurse Practitioner

## 2022-01-02 ENCOUNTER — Encounter: Payer: Self-pay | Admitting: Nurse Practitioner

## 2022-01-02 VITALS — BP 117/61 | HR 60 | Temp 97.6°F | Ht 70.0 in | Wt 367.4 lb

## 2022-01-02 DIAGNOSIS — E119 Type 2 diabetes mellitus without complications: Secondary | ICD-10-CM

## 2022-01-02 LAB — POCT GLYCOSYLATED HEMOGLOBIN (HGB A1C)
HbA1c POC (<> result, manual entry): 6.2 % (ref 4.0–5.6)
HbA1c, POC (controlled diabetic range): 6.2 % (ref 0.0–7.0)
HbA1c, POC (prediabetic range): 6.2 % (ref 5.7–6.4)
Hemoglobin A1C: 6.2 % — AB (ref 4.0–5.6)

## 2022-01-02 NOTE — Patient Instructions (Addendum)
1. Type 2 diabetes mellitus without complication, without long-term current use of insulin (HCC)  - POCT glycosylated hemoglobin (Hb A1C) - CBC - Comprehensive metabolic panel    Follow up:  Follow up in 3 months or sooner if needed

## 2022-01-02 NOTE — Progress Notes (Signed)
@Patient  ID: , male    DOB: 07/14/1965, 56 y.o.   MRN: [id number]  Chief Complaint  Patient presents with   Follow-up    Pt is here for DM 3 month's follow up visit. Pt states his joints hurts legs, knees, elbows    Referring provider: No ref. provider found   HPI  Timothy Thomas 56 y.o. male  has a past medical history of Aphasia, COPD (chronic obstructive pulmonary disease) (HCC), Glaucoma, Macular degeneration, Myocardial infarction (HCC), Obesity, Obstructive sleep apnea, Sleep apnea, Tachycardia-bradycardia syndrome (HCC), Urinary retention (08/2019), Variant angina (HCC), and Vasovagal syncope. To the [facility] for reevaluation of DM2.  Diabetes Mellitus: Patient presents for follow up of diabetes. Symptoms: none. Denies any symptoms. Patient denies none.  Evaluation to date has been included: hemoglobin A1C.  Home sugars: patient does not check sugars. Treatment to date: no recent interventions.     Denies any other concerns today. Denies any fatigue, chest pain, shortness of breath, HA or dizziness. Denies any blurred vision, numbness or tingling.  Coumadin levels - in [place]        Allergies  Allergen Reactions   Codeine Nausea And Vomiting    Low tolerance to narcotics   Ketorolac Nausea And Vomiting    Immunization History  Administered Date(s) Administered   Influenza,inj,Quad PF,6+ Mos 02/09/2017, 04/06/2018, 03/29/2019, 03/28/2020, 04/04/2021   PFIZER(Purple Top)SARS-COV-2 Vaccination 08/18/2019, 09/06/2019   Pneumococcal Polysaccharide-23 10/02/2016   Tdap 12/22/2018    Past Medical History:  Diagnosis Date   Aphasia    Transient - negative head CT and EEG with neurology workup February 2017 [facility])   Asthma    COPD (chronic obstructive pulmonary disease) (HCC)    Depression    Diabetes mellitus without complication (HCC)    Dyspnea    Glaucoma    Macular degeneration    Myocardial infarction (HCC) 2019   Obesity    Obstructive sleep  apnea    compliant   Sleep apnea    Tachycardia-bradycardia syndrome (HCC)    Status post pacemaker   Urinary retention 08/2019   Variant angina (HCC)    History of normal coronary arteries at cardiac catheterization 2002 - vasospasm noted   Vasovagal syncope     Tobacco History: Social History   Tobacco Use  Smoking Status Every Day   Packs/day: 1.00   Years: 40.00   Total pack years: 40.00   Types: Cigarettes   Start date: 09/26/1976  Smokeless Tobacco Never  Tobacco Comments   currently smoking 1/2ppd   Ready to quit: Not Answered Counseling given: Not Answered Tobacco comments: currently smoking 1/2ppd   Outpatient Encounter Medications as of 01/02/2022  Medication Sig   albuterol (PROVENTIL) (2.5 MG/3ML) 0.083% nebulizer solution USE THREE MILLILITERS VIA NEBULIZATION BY MOUTH EVERY 6 HOURS AS NEEDED FOR WHEEZING OR FOR SHORTNESS OF BREATH   aspirin EC 81 MG tablet Take 81 mg by mouth daily.   atorvastatin (LIPITOR) 40 MG tablet TAKE ONE TABLET BY MOUTH EVERY EVENING AT 6PM   chlorpheniramine (CHLOR-TRIMETON) 4 MG tablet Take 4 mg by mouth at bedtime.   diphenhydramine-acetaminophen (TYLENOL PM) 25-500 MG TABS tablet Take 2 tablets by mouth at bedtime.   fluticasone furoate-vilanterol (BREO ELLIPTA) 100-25 MCG/ACT AEPB Inhale 1 puff into the lungs daily.   furosemide (LASIX) 20 MG tablet TAKE ONE TO TWO TABLETS BY MOUTH DAILY FOR LEG SWELLING (Patient taking differently: Take 20-40 mg by mouth See admin instructions. Take 20 mg daily, may increase to  40 mg daily as needed for swelling)   metFORMIN (GLUCOPHAGE) 500 MG tablet TAKE ONE TABLET BY MOUTH TWICE A DAY WITH MEALS   omeprazole (PRILOSEC) 20 MG capsule TAKE ONE CAPSULE BY MOUTH DAILY   polyethylene glycol-electrolytes (NULYTELY) 420 g solution As directed   traZODone (DESYREL) 100 MG tablet TAKE ONE TABLET BY MOUTH EVERY NIGHT AT BEDTIME AS NEEDED FOR SLEEP (Patient taking differently: Take 100 mg by mouth at  bedtime.)   warfarin (COUMADIN) 5 MG tablet Take 10 mg by mouth daily.   [DISCONTINUED] albuterol (VENTOLIN HFA) 108 (90 Base) MCG/ACT inhaler INHALE TWO PUFFS BY MOUTH EVERY 6 HOURS AS NEEDED FOR SHORTNESS OF BREATH AND FOR WHEEZING   [DISCONTINUED] cetirizine (ZYRTEC) 10 MG tablet Take 10 mg by mouth daily.   [DISCONTINUED] Docusate Sodium (COLACE PO) Take by mouth.   montelukast (SINGULAIR) 10 MG tablet Take 1 tablet (10 mg total) by mouth at bedtime.   [DISCONTINUED] Fluticasone-Umeclidin-Vilant (TRELEGY ELLIPTA) 200-62.5-25 MCG/ACT AEPB Inhale 1 puff into the lungs daily. (Patient not taking: Reported on 01/02/2022)   No facility-administered encounter medications on file as of 01/02/2022.     Review of Systems  Review of Systems  Constitutional: Negative.   HENT: Negative.    Cardiovascular: Negative.   Gastrointestinal: Negative.   Allergic/Immunologic: Negative.   Neurological: Negative.   Psychiatric/Behavioral: Negative.         Physical Exam  BP 117/61 (BP Location: Left Arm, Patient Position: Sitting, Cuff Size: Large)   Pulse 60   Temp 97.6 F (36.4 C)   Ht 5\' 10"  (1.778 m)   Wt (!) 367 lb 7 oz (166.7 kg)   SpO2 96%   BMI 52.72 kg/m   Wt Readings from Last 5 Encounters:  01/28/22 (!) 365 lb (165.6 kg)  01/26/22 (!) 365 lb (165.6 kg)  01/02/22 (!) 367 lb 7 oz (166.7 kg)  10/28/21 (!) 362 lb 6.4 oz (164.4 kg)  10/28/21 (!) 361 lb 9.6 oz (164 kg)     Physical Exam Vitals and nursing note reviewed.  Constitutional:      General: He is not in acute distress.    Appearance: He is well-developed.  Cardiovascular:     Rate and Rhythm: Normal rate and regular rhythm.  Pulmonary:     Effort: Pulmonary effort is normal.     Breath sounds: Normal breath sounds.  Skin:    General: Skin is warm and dry.  Neurological:     Mental Status: He is alert and oriented to person, place, and time.      Lab Results:  CBC    Component Value Date/Time   WBC 9.7  01/02/2022 1509   WBC 10.2 05/07/2017 1400   RBC 4.82 01/02/2022 1509   RBC 4.54 05/07/2017 1400   HGB 12.6 (L) 01/02/2022 1509   HCT 39.5 01/02/2022 1509   PLT 318 01/02/2022 1509   MCV 82 01/02/2022 1509   MCH 26.1 (L) 01/02/2022 1509   MCH 28.2 05/07/2017 1400   MCHC 31.9 01/02/2022 1509   MCHC 31.9 05/07/2017 1400   RDW 15.3 01/02/2022 1509   LYMPHSABS 2.2 01/01/2021 1347   EOSABS 0.4 01/01/2021 1347   BASOSABS 0.1 01/01/2021 1347    BMET    Component Value Date/Time   NA 143 01/02/2022 1509   K 4.4 01/02/2022 1509   CL 104 01/02/2022 1509   CO2 22 01/02/2022 1509   GLUCOSE 78 01/02/2022 1509   GLUCOSE 121 (H) 08/25/2017 1144   BUN  8 01/02/2022 1509   CREATININE 0.77 01/02/2022 1509   CREATININE 0.69 (L) 03/24/2017 1214   CALCIUM 9.7 01/02/2022 1509   GFRNONAA 106 04/12/2019 1022   GFRNONAA 108 03/24/2017 1214   GFRAA 122 04/12/2019 1022   GFRAA 126 03/24/2017 1214    BNP    Component Value Date/Time   BNP 15.0 11/05/2016 1421    ProBNP    Component Value Date/Time   PROBNP 21.0 08/25/2017 1144    Imaging: No results found.   Assessment & Plan:   Type 2 diabetes mellitus without complication, without long-term current use of insulin (HCC) - POCT glycosylated hemoglobin (Hb A1C) - CBC - Comprehensive metabolic panel    Follow up:  Follow up in 3 months or sooner if needed     Ivonne Andrew, NP 02/24/2022

## 2022-01-03 LAB — COMPREHENSIVE METABOLIC PANEL
ALT: 16 IU/L (ref 0–44)
AST: 13 IU/L (ref 0–40)
Albumin/Globulin Ratio: 1.7 (ref 1.2–2.2)
Albumin: 4.5 g/dL (ref 3.8–4.9)
Alkaline Phosphatase: 93 IU/L (ref 44–121)
BUN/Creatinine Ratio: 10 (ref 9–20)
BUN: 8 mg/dL (ref 6–24)
Bilirubin Total: 0.3 mg/dL (ref 0.0–1.2)
CO2: 22 mmol/L (ref 20–29)
Calcium: 9.7 mg/dL (ref 8.7–10.2)
Chloride: 104 mmol/L (ref 96–106)
Creatinine, Ser: 0.77 mg/dL (ref 0.76–1.27)
Globulin, Total: 2.6 g/dL (ref 1.5–4.5)
Glucose: 78 mg/dL (ref 70–99)
Potassium: 4.4 mmol/L (ref 3.5–5.2)
Sodium: 143 mmol/L (ref 134–144)
Total Protein: 7.1 g/dL (ref 6.0–8.5)
eGFR: 104 mL/min/{1.73_m2} (ref >59)

## 2022-01-03 LAB — CBC
Hematocrit: 39.5 % (ref 37.5–51.0)
Hemoglobin: 12.6 g/dL — ABNORMAL LOW (ref 13.0–17.7)
MCH: 26.1 pg — ABNORMAL LOW (ref 26.6–33.0)
MCHC: 31.9 g/dL (ref 31.5–35.7)
MCV: 82 fL (ref 79–97)
Platelets: 318 10*3/uL (ref 150–450)
RBC: 4.82 x10E6/uL (ref 4.14–5.80)
RDW: 15.3 % (ref 11.6–15.4)
WBC: 9.7 10*3/uL (ref 3.4–10.8)

## 2022-01-12 ENCOUNTER — Other Ambulatory Visit: Payer: Self-pay | Admitting: Pulmonary Disease

## 2022-01-22 ENCOUNTER — Telehealth: Payer: Self-pay | Admitting: Pulmonary Disease

## 2022-01-22 NOTE — Telephone Encounter (Signed)
pt states he is in a donut hole with his insurance. pt states he was switch from trelogy to breo and the medication is working, but the [employer]) has went from 61.00 to 128. please advise   pharmacy: [facility] [address]   Incoming call     PA team please advise, is this something we can test copays on for inhaler alternatives?

## 2022-01-22 NOTE — Patient Instructions (Addendum)
CANE DUBRAY  01/22/2022     @PREFPERIOPPHARMACY @   Your procedure is scheduled on  01/28/2022.   Report to 01/30/2022 at  0700  A.M.   Call this number if you have problems the morning of surgery:  [phone]   Remember:  Follow the diet and prep instructions given to you by the office.      Follow the instructions from hematology about lovenox bridge.    Use your nebulizer and your inhaler before you come and bring your rescue inhaler with you.       DO NOT take any medications for diabetes the morning of your procedure.     Take these medicines the morning of surgery with A SIP OF WATER                                          Prilosec.     Do not wear jewelry, make-up or nail polish.  Do not wear lotions, powders, or perfumes, or deodorant.  Do not shave 48 hours prior to surgery.  Men may shave face and neck.  Do not bring valuables to the hospital.  [facility] is not responsible for any belongings or valuables.  Contacts, dentures or bridgework may not be worn into surgery.  Leave your suitcase in the car.  After surgery it may be brought to your room.  For patients admitted to the hospital, discharge time will be determined by your treatment team.  Patients discharged the day of surgery will not be allowed to drive home and must have someone with them for 24 hours.    Special instructions:   DO NOT smoke tobacco or vape for 24 hours before your procedure.  Please read over the following fact sheets that you were given. Anesthesia Post-op Instructions and Care and Recovery After Surgery       Colonoscopy, Adult, Care After The following information offers guidance on how to care for yourself after your procedure. Your health care provider may also give you more specific instructions. If you have problems or questions, contact your health care provider. What can I expect after the procedure? After the procedure, it is common to have: A small  amount of blood in your stool for 24 hours after the procedure. Some gas. Mild cramping or bloating of your abdomen. Follow these instructions at home: Eating and drinking  Drink enough fluid to keep your urine pale yellow. Follow instructions from your health care provider about eating or drinking restrictions. Resume your normal diet as told by your health care provider. Avoid heavy or fried foods that are hard to digest. Activity Rest as told by your health care provider. Avoid sitting for a long time without moving. Get up to take short walks every 1-2 hours. This is important to improve blood flow and breathing. Ask for help if you feel weak or unsteady. Return to your normal activities as told by your health care provider. Ask your health care provider what activities are safe for you. Managing cramping and bloating  Try walking around when you have cramps or feel bloated. If directed, apply heat to your abdomen as told by your health care provider. Use the heat source that your health care provider recommends, such as a moist heat pack or a heating pad. Place a towel between your skin and the heat  source. Leave the heat on for 20-30 minutes. Remove the heat if your skin turns bright red. This is especially important if you are unable to feel pain, heat, or cold. You have a greater risk of getting burned. General instructions If you were given a sedative during the procedure, it can affect you for several hours. Do not drive or operate machinery until your health care provider says that it is safe. For the first 24 hours after the procedure: Do not sign important documents. Do not drink alcohol. Do your regular daily activities at a slower pace than normal. Eat soft foods that are easy to digest. Take over-the-counter and prescription medicines only as told by your health care provider. Keep all follow-up visits. This is important. Contact a health care provider if: You have blood  in your stool 2-3 days after the procedure. Get help right away if: You have more than a small spotting of blood in your stool. You have large blood clots in your stool. You have swelling of your abdomen. You have nausea or vomiting. You have a fever. You have increasing pain in your abdomen that is not relieved with medicine. These symptoms may be an emergency. Get help right away. Call 911. Do not wait to see if the symptoms will go away. Do not drive yourself to the hospital. Summary After the procedure, it is common to have a small amount of blood in your stool. You may also have mild cramping and bloating of your abdomen. If you were given a sedative during the procedure, it can affect you for several hours. Do not drive or operate machinery until your health care provider says that it is safe. Get help right away if you have a lot of blood in your stool, nausea or vomiting, a fever, or increased pain in your abdomen. This information is not intended to replace advice given to you by your health care provider. Make sure you discuss any questions you have with your health care provider. Document Revised: 01/15/2021 Document Reviewed: 01/15/2021 Elsevier Patient Education  2023 [employer] Monitored Anesthesia Care, Care After This sheet gives you information about how to care for yourself after your procedure. Your health care provider may also give you more specific instructions. If you have problems or questions, contact your health care provider. What can I expect after the procedure? After the procedure, it is common to have: Tiredness. Forgetfulness about what happened after the procedure. Impaired judgment for important decisions. Nausea or vomiting. Some difficulty with balance. Follow these instructions at home: For the time period you were told by your health care provider:     Rest as needed. Do not participate in activities where you could fall or become  injured. Do not drive or use machinery. Do not drink alcohol. Do not take sleeping pills or medicines that cause drowsiness. Do not make important decisions or sign legal documents. Do not take care of children on your own. Eating and drinking Follow the diet that is recommended by your health care provider. Drink enough fluid to keep your urine pale yellow. If you vomit: Drink water, juice, or soup when you can drink without vomiting. Make sure you have little or no nausea before eating solid foods. General instructions Have a responsible adult stay with you for the time you are told. It is important to have someone help care for you until you are awake and alert. Take over-the-counter and prescription medicines only as told by your health care  provider. If you have sleep apnea, surgery and certain medicines can increase your risk for breathing problems. Follow instructions from your health care provider about wearing your sleep device: Anytime you are sleeping, including during daytime naps. While taking prescription pain medicines, sleeping medicines, or medicines that make you drowsy. Avoid smoking. Keep all follow-up visits as told by your health care provider. This is important. Contact a health care provider if: You keep feeling nauseous or you keep vomiting. You feel light-headed. You are still sleepy or having trouble with balance after 24 hours. You develop a rash. You have a fever. You have redness or swelling around the IV site. Get help right away if: You have trouble breathing. You have new-onset confusion at home. Summary For several hours after your procedure, you may feel tired. You may also be forgetful and have poor judgment. Have a responsible adult stay with you for the time you are told. It is important to have someone help care for you until you are awake and alert. Rest as told. Do not drive or operate machinery. Do not drink alcohol or take sleeping  pills. Get help right away if you have trouble breathing, or if you suddenly become confused. This information is not intended to replace advice given to you by your health care provider. Make sure you discuss any questions you have with your health care provider. Document Revised: 04/29/2021 Document Reviewed: 04/27/2019 Elsevier Patient Education  2023 [employer].

## 2022-01-26 ENCOUNTER — Other Ambulatory Visit (HOSPITAL_COMMUNITY): Payer: Self-pay

## 2022-01-26 ENCOUNTER — Encounter (HOSPITAL_COMMUNITY): Payer: Self-pay

## 2022-01-26 ENCOUNTER — Encounter (HOSPITAL_COMMUNITY)
Admission: RE | Admit: 2022-01-26 | Discharge: 2022-01-26 | Disposition: A | Discharge status: Home / Self Care | Payer: Medicare HMO | Source: Ambulatory Visit | Attending: Internal Medicine | Admitting: Internal Medicine

## 2022-01-26 DIAGNOSIS — Z720 Tobacco use: Secondary | ICD-10-CM | POA: Diagnosis not present

## 2022-01-26 DIAGNOSIS — I4510 Unspecified right bundle-branch block: Secondary | ICD-10-CM | POA: Diagnosis not present

## 2022-01-26 DIAGNOSIS — Z01810 Encounter for preprocedural cardiovascular examination: Secondary | ICD-10-CM | POA: Insufficient documentation

## 2022-01-26 DIAGNOSIS — I5189 Other ill-defined heart diseases: Secondary | ICD-10-CM | POA: Insufficient documentation

## 2022-01-26 HISTORY — DX: Dyspnea, unspecified: R06.00

## 2022-01-26 HISTORY — DX: Type 2 diabetes mellitus without complications: E11.9

## 2022-01-26 HISTORY — DX: Depression, unspecified: F32.A

## 2022-01-26 HISTORY — DX: Unspecified asthma, uncomplicated: J45.909

## 2022-01-27 NOTE — Telephone Encounter (Signed)
Called and spoke to patient and he voiced understanding. He states he will call his insurance company and will call us back with alternatives. Nothing further needed at this time.

## 2022-01-28 ENCOUNTER — Encounter (HOSPITAL_COMMUNITY): Payer: Self-pay | Admitting: Internal Medicine

## 2022-01-28 ENCOUNTER — Other Ambulatory Visit: Payer: Self-pay

## 2022-01-28 ENCOUNTER — Encounter (HOSPITAL_COMMUNITY)
Admission: RE | Disposition: A | Discharge status: Home / Self Care | Payer: Self-pay | Source: Home / Self Care | Attending: Internal Medicine

## 2022-01-28 ENCOUNTER — Ambulatory Visit (HOSPITAL_BASED_OUTPATIENT_CLINIC_OR_DEPARTMENT_OTHER): Payer: Medicare HMO | Admitting: Certified Registered Nurse Anesthetist

## 2022-01-28 ENCOUNTER — Ambulatory Visit (HOSPITAL_COMMUNITY): Payer: Medicare HMO | Admitting: Certified Registered Nurse Anesthetist

## 2022-01-28 ENCOUNTER — Ambulatory Visit (HOSPITAL_COMMUNITY)
Admission: RE | Admit: 2022-01-28 | Discharge: 2022-01-28 | Disposition: A | Discharge status: Home / Self Care | Payer: Medicare HMO | Attending: Internal Medicine | Admitting: Internal Medicine

## 2022-01-28 DIAGNOSIS — K621 Rectal polyp: Secondary | ICD-10-CM | POA: Diagnosis not present

## 2022-01-28 DIAGNOSIS — Z1211 Encounter for screening for malignant neoplasm of colon: Secondary | ICD-10-CM | POA: Insufficient documentation

## 2022-01-28 DIAGNOSIS — Z6841 Body Mass Index (BMI) 40.0 and over, adult: Secondary | ICD-10-CM | POA: Insufficient documentation

## 2022-01-28 DIAGNOSIS — K635 Polyp of colon: Secondary | ICD-10-CM | POA: Diagnosis not present

## 2022-01-28 DIAGNOSIS — Z79899 Other long term (current) drug therapy: Secondary | ICD-10-CM | POA: Insufficient documentation

## 2022-01-28 DIAGNOSIS — K644 Residual hemorrhoidal skin tags: Secondary | ICD-10-CM | POA: Insufficient documentation

## 2022-01-28 DIAGNOSIS — E119 Type 2 diabetes mellitus without complications: Secondary | ICD-10-CM | POA: Insufficient documentation

## 2022-01-28 DIAGNOSIS — K5730 Diverticulosis of large intestine without perforation or abscess without bleeding: Secondary | ICD-10-CM | POA: Insufficient documentation

## 2022-01-28 DIAGNOSIS — Z7951 Long term (current) use of inhaled steroids: Secondary | ICD-10-CM | POA: Diagnosis not present

## 2022-01-28 DIAGNOSIS — G4733 Obstructive sleep apnea (adult) (pediatric): Secondary | ICD-10-CM | POA: Diagnosis not present

## 2022-01-28 DIAGNOSIS — Z7984 Long term (current) use of oral hypoglycemic drugs: Secondary | ICD-10-CM | POA: Diagnosis not present

## 2022-01-28 DIAGNOSIS — G709 Myoneural disorder, unspecified: Secondary | ICD-10-CM | POA: Insufficient documentation

## 2022-01-28 DIAGNOSIS — F17200 Nicotine dependence, unspecified, uncomplicated: Secondary | ICD-10-CM | POA: Insufficient documentation

## 2022-01-28 DIAGNOSIS — Z950 Presence of cardiac pacemaker: Secondary | ICD-10-CM | POA: Diagnosis not present

## 2022-01-28 DIAGNOSIS — J449 Chronic obstructive pulmonary disease, unspecified: Secondary | ICD-10-CM | POA: Diagnosis not present

## 2022-01-28 HISTORY — PX: POLYPECTOMY: SHX5525

## 2022-01-28 HISTORY — PX: COLONOSCOPY WITH PROPOFOL: SHX5780

## 2022-01-28 LAB — GLUCOSE, CAPILLARY: Glucose-Capillary: 98 mg/dL (ref 70–99)

## 2022-01-28 SURGERY — COLONOSCOPY WITH PROPOFOL
Anesthesia: General

## 2022-01-28 MED ORDER — PROPOFOL 10 MG/ML IV BOLUS
INTRAVENOUS | Status: DC | PRN
  Administered 2022-01-28: 40 mg via INTRAVENOUS

## 2022-01-28 MED ORDER — PROPOFOL 500 MG/50ML IV EMUL
INTRAVENOUS | Status: AC
  Filled 2022-01-28: qty 150

## 2022-01-28 MED ORDER — PROPOFOL 500 MG/50ML IV EMUL
INTRAVENOUS | Status: DC | PRN
  Administered 2022-01-28: 125 ug/kg/min via INTRAVENOUS

## 2022-01-28 MED ORDER — LACTATED RINGERS IV SOLN: INTRAVENOUS | Status: DC | PRN

## 2022-01-28 MED ORDER — ARTIFICIAL TEARS OPHTHALMIC OINT
TOPICAL_OINTMENT | OPHTHALMIC | Status: AC
  Filled 2022-01-28: qty 3.5

## 2022-01-28 MED ORDER — PROPOFOL 10 MG/ML IV BOLUS
INTRAVENOUS | Status: AC
  Filled 2022-01-28: qty 20

## 2022-01-28 MED ORDER — PROPOFOL 1000 MG/100ML IV EMUL
INTRAVENOUS | Status: AC
  Filled 2022-01-28: qty 200

## 2022-01-28 MED ORDER — LIDOCAINE HCL (PF) 2 % IJ SOLN
INTRAMUSCULAR | Status: AC
  Filled 2022-01-28: qty 20

## 2022-01-28 MED ORDER — LIDOCAINE 2% (20 MG/ML) 5 ML SYRINGE
INTRAMUSCULAR | Status: DC | PRN
  Administered 2022-01-28: 100 mg via INTRAVENOUS

## 2022-01-28 NOTE — Anesthesia Preprocedure Evaluation (Signed)
Anesthesia Evaluation  Patient identified by MRN, date of birth, ID band Patient awake    Reviewed: Allergy & Precautions, H&P , NPO status , Patient's Chart, lab work & pertinent test results, reviewed documented beta blocker date and time   Airway Mallampati: II  TM Distance: >3 FB Neck ROM: full    Dental no notable dental hx.    Pulmonary shortness of breath, asthma , sleep apnea , COPD,  COPD inhaler, Current Smoker and Patient abstained from smoking.,    Pulmonary exam normal breath sounds clear to auscultation       Cardiovascular Exercise Tolerance: Good + angina + pacemaker  Rhythm:regular Rate:Normal     Neuro/Psych  Headaches, PSYCHIATRIC DISORDERS Depression  Neuromuscular disease    GI/Hepatic negative GI ROS, Neg liver ROS,   Endo/Other  diabetesMorbid obesity  Renal/GU negative Renal ROS  negative genitourinary   Musculoskeletal   Abdominal   Peds  Hematology  (+) Blood dyscrasia, anemia ,   Anesthesia Other Findings   Reproductive/Obstetrics negative OB ROS                             Anesthesia Physical Anesthesia Plan  ASA: 3  Anesthesia Plan: General   Post-op Pain Management:    Induction:   PONV Risk Score and Plan: Propofol infusion  Airway Management Planned:   Additional Equipment:   Intra-op Plan:   Post-operative Plan:   Informed Consent: I have reviewed the patients History and Physical, chart, labs and discussed the procedure including the risks, benefits and alternatives for the proposed anesthesia with the patient or authorized representative who has indicated his/her understanding and acceptance.     Dental Advisory Given  Plan Discussed with: CRNA  Anesthesia Plan Comments:         Anesthesia Quick Evaluation

## 2022-01-28 NOTE — Op Note (Signed)
[facility] Patient Name: Timothy Thomas Procedure Date: 01/28/2022 8:06 AM MRN: [id number] Date of Birth: 25-May-1966 Attending MD: Gennette Pac , MD CSN: [id number] Age: 56 Admit Type: Outpatient Procedure:                Colonoscopy Indications:              Screening for colorectal malignant neoplasm Providers:                Gennette Pac, MD, Enzo Montgomery RN, RN,                            Edrick Kins, RN, Lennice Sites Technician, Technician Referring MD:             Gennette Pac, MD Medicines:                Propofol per Anesthesia Complications:            No immediate complications. Estimated Blood Loss:     Estimated blood loss was minimal. Procedure:                Pre-Anesthesia Assessment:                           - Prior to the procedure, a History and Physical                            was performed, and patient medications and                            allergies were reviewed. The patient's tolerance of                            previous anesthesia was also reviewed. The risks                            and benefits of the procedure and the sedation                            options and risks were discussed with the patient.                            All questions were answered, and informed consent                            was obtained. Prior Anticoagulants: The patient has                            taken no previous anticoagulant or antiplatelet                            agents. ASA Grade Assessment: III - A patient with                            severe systemic disease. After reviewing the risks  and benefits, the patient was deemed in                            satisfactory condition to undergo the procedure.                           After obtaining informed consent, the colonoscope                            was passed under direct vision. Throughout the                            procedure, the  patient's blood pressure, pulse, and                            oxygen saturations were monitored continuously. The                            [id number]) scope was introduced through the                            anus and advanced to the the cecum, identified by                            appendiceal orifice and ileocecal valve. The                            colonoscopy was performed without difficulty. The                            patient tolerated the procedure well. The quality                            of the bowel preparation was adequate. Scope In: 8:40:32 AM Scope Out: 9:03:13 AM Scope Withdrawal Time: 0 hours 18 minutes 44 seconds  Total Procedure Duration: 0 hours 22 minutes 41 seconds  Findings:      The perianal and digital rectal examinations were normal.      Scattered small-mouthed diverticula were found in the sigmoid colon and       descending colon. Redundant, elongated colon requiring external       abdominal pressure to reach the cecum.      Three semi-pedunculated polyps were found in the rectum, distal rectum       and descending colon. The polyps were 3 to 5 mm in size. These polyps       were removed with a cold snare. Resection and retrieval were complete.       Estimated blood loss was minimal.      A 7 mm polyp was found in the rectum at the anorectal junction.       Initially appeared to represent anal papilla but pattern suggested       adenoma.. The polyp was pedunculated. The polyp was removed with a hot       snare. Resection and retrieval were complete. Estimated blood loss: none.      The exam was otherwise without abnormality on  direct and retroflexion       views. Impression:               - Diverticulosis in the sigmoid colon and in the                            descending colon.                           - Three 3 to 5 mm polyps in the rectum, in the                            distal rectum and in the descending colon, removed                             with a cold snare. Resected and retrieved.                           - One 7 mm polyp in the rectum, removed with a hot                            snare. Resected and retrieved.                           - The examination was otherwise normal on direct                            and retroflexion views. Moderate Sedation:      Moderate (conscious) sedation was personally administered by an       anesthesia professional. The following parameters were monitored: oxygen       saturation, heart rate, blood pressure, respiratory rate, EKG, adequacy       of pulmonary ventilation, and response to care. Recommendation:           - Patient has a contact number available for                            emergencies. The signs and symptoms of potential                            delayed complications were discussed with the                            patient. Return to normal activities tomorrow.                            Written discharge instructions were provided to the                            patient.                           - Advance diet as tolerated.                           -  Continue present medications. Resume Coumadin                            today. Resume Lovenox per hematology                            recommendations.                           - Repeat colonoscopy date to be determined after                            pending pathology results are reviewed for                            surveillance.                           - Return to GI office (date not yet determined). Procedure Code(s):        --- Professional ---                           [phone], Colonoscopy, flexible; with removal of                            tumor(s), polyp(s), or other lesion(s) by snare                            technique Diagnosis Code(s):        --- Professional ---                           Z12.11, Encounter for screening for malignant                            neoplasm of  colon                           K62.1, Rectal polyp                           K63.5, Polyp of colon                           K57.30, Diverticulosis of large intestine without                            perforation or abscess without bleeding CPT copyright 2019 American Medical Association. All rights reserved. The codes documented in this report are preliminary and upon coder review may  be revised to meet current compliance requirements. Gerrit Friends. Malijah Lietz, MD Gennette Pac, MD 01/28/2022 9:15:16 AM This report has been signed electronically. Number of Addenda: 0

## 2022-01-28 NOTE — H&P (Signed)
@LOGO @   Primary Care Physician:  , FNP Primary Gastroenterologist:  Dr. Massie Maroon  Pre-Procedure History & Physical: HPI:  Timothy Thomas is a 56 y.o. male is here for a screening colonoscopy.  No bowel symptoms.  No family history of colon cancer.  No prior colonoscopy.  Last dose of Lovenox 6 PM last evening.  Last Coumadin reported 4 days ago.  Past Medical History:  Diagnosis Date   Aphasia    Transient - negative head CT and EEG with neurology workup February 2017 [facility])   Asthma    COPD (chronic obstructive pulmonary disease) (HCC)    Depression    Diabetes mellitus without complication (HCC)    Dyspnea    Glaucoma    Macular degeneration    Myocardial infarction (HCC) 2019   Obesity    Obstructive sleep apnea    compliant   Sleep apnea    Tachycardia-bradycardia syndrome (HCC)    Status post pacemaker   Urinary retention 08/2019   Variant angina (HCC)    History of normal coronary arteries at cardiac catheterization 2002 - vasospasm noted   Vasovagal syncope     Past Surgical History:  Procedure Laterality Date   INCISION AND DRAINAGE ABSCESS N/A    LEFT HEART CATH AND CORONARY ANGIOGRAPHY N/A 09/28/2016   Procedure: Left Heart Cath and Coronary Angiography;  Surgeon: 09/30/2016, MD;  Location: [facility] INVASIVE [facility];  Service: Cardiovascular;  Laterality: N/A;   PACEMAKER INSERTION      Prior to Admission medications   Medication Sig Start Date End Date Taking? Authorizing Provider  albuterol (PROVENTIL) (2.5 MG/3ML) 0.083% nebulizer solution USE THREE MILLILITERS VIA NEBULIZATION BY MOUTH EVERY 6 HOURS AS NEEDED FOR WHEEZING OR FOR SHORTNESS OF BREATH 03/24/21  Yes 03/26/21, MD  albuterol (VENTOLIN HFA) 108 (90 Base) MCG/ACT inhaler INHALE TWO PUFFS BY MOUTH EVERY 6  HOURS AS NEEDED FOR SHORTNESS OF BREATH AND WHEEZING 01/12/22  Yes 03/14/22, MD  aspirin EC 81 MG tablet Take 81 mg by mouth daily.   Yes [provider]   atorvastatin (LIPITOR) 40 MG tablet TAKE ONE TABLET BY MOUTH EVERY EVENING AT 6PM 10/17/21  Yes Passmore, 12/17/21 I, NP  chlorpheniramine (CHLOR-TRIMETON) 4 MG tablet Take 4 mg by mouth at bedtime.   Yes [provider]  diphenhydramine-acetaminophen (TYLENOL PM) 25-500 MG TABS tablet Take 2 tablets by mouth at bedtime.   Yes [provider]  docusate sodium (COLACE) 100 MG capsule Take 200 mg by mouth daily.   Yes [provider]  enoxaparin (LOVENOX) 80 MG/0.8ML injection Inject 80 mg into the skin every 12 (twelve) hours.   Yes [provider]  FIBER PO Take 2 capsules by mouth daily.   Yes [provider]  fluticasone furoate-vilanterol (BREO ELLIPTA) 100-25 MCG/ACT AEPB Inhale 1 puff into the lungs daily. 12/29/21  Yes 12/31/21, MD  furosemide (LASIX) 20 MG tablet TAKE ONE TO TWO TABLETS BY MOUTH DAILY FOR LEG SWELLING Patient taking differently: Take 20-40 mg by mouth See admin instructions. Take 20 mg daily, may increase to 40 mg daily as needed for swelling 10/15/21  Yes Passmore, Tewana I, NP  loratadine (CLARITIN) 10 MG tablet Take 10 mg by mouth daily.   Yes [provider]  metFORMIN (GLUCOPHAGE) 500 MG tablet TAKE ONE TABLET BY MOUTH TWICE A DAY WITH MEALS 12/24/21  Yes 12/26/21, NP  montelukast (SINGULAIR) 10 MG tablet Take 1 tablet (  10 mg total) by mouth at bedtime. 10/28/21 01/20/22 Yes Oretha Milch, MD  omeprazole (PRILOSEC) 20 MG capsule TAKE ONE CAPSULE BY MOUTH DAILY 11/17/21  Yes Oretha Milch, MD  polyethylene glycol-electrolytes (NULYTELY) 420 g solution As directed 12/04/21  Yes Yesha Muchow, Gerrit Friends, MD  traZODone (DESYREL) 100 MG tablet TAKE ONE TABLET BY MOUTH EVERY NIGHT AT BEDTIME AS NEEDED FOR SLEEP Patient taking differently: Take 100 mg by mouth at bedtime. 11/28/21  Yes Ivonne Andrew, NP  warfarin (COUMADIN) 5 MG tablet Take 10 mg by mouth daily.   Yes [provider]    Allergies as of 12/04/2021  - Review Complete 10/28/2021  Allergen Reaction Noted   Codeine Nausea And Vomiting 02/05/2016   Ketorolac Nausea And Vomiting 02/05/2016    Family History  Problem Relation Age of Onset   Hypertension Mother    Diabetes Father    Hypertension Father    Stroke Father    Colon cancer Neg Hx     Social History   Socioeconomic History   Marital status: Married    Spouse name: Not on file   Number of children: Not on file   Years of education: Not on file   Highest education level: Not on file  Occupational History   Not on file  Tobacco Use   Smoking status: Every Day    Packs/day: 1.00    Years: 40.00    Total pack years: 40.00    Types: Cigarettes    Start date: 09/26/1976   Smokeless tobacco: Never   Tobacco comments:    currently smoking 1/2ppd  Vaping Use   Vaping Use: Never used  Substance and Sexual Activity   Alcohol use: No   Drug use: No   Sexual activity: Yes  Other Topics Concern   Not on file  Social History Narrative   Not on file   Social Determinants of Health   Financial Resource Strain: Low Risk  (03/07/2021)   Overall Financial Resource Strain (CARDIA)    Difficulty of Paying Living Expenses: Not hard at all  Food Insecurity: No Food Insecurity (03/07/2021)   Hunger Vital Sign    Worried About Running Out of Food in the Last Year: Never true    Ran Out of Food in the Last Year: Never true  Transportation Needs: No Transportation Needs (03/07/2021)   PRAPARE - Administrator, Civil Service (Medical): No    Lack of Transportation (Non-Medical): No  Physical Activity: Insufficiently Active (03/07/2021)   Exercise Vital Sign    Days of Exercise per Week: 2 days    Minutes of Exercise per Session: 10 min  Stress: Stress Concern Present (03/07/2021)   [employer] of Occupational Health - Occupational Stress Questionnaire    Feeling of Stress : Very much  Social Connections: Moderately Integrated (03/07/2021)   Social Connection  and Isolation Panel [NHANES]    Frequency of Communication with Friends and Family: More than three times a week    Frequency of Social Gatherings with Friends and Family: More than three times a week    Attends Religious Services: More than 4 times per year    Active Member of [employer] or Organizations: No    Attends Banker Meetings: Never    Marital Status: Married  Catering manager Violence: Not At Risk (03/07/2021)   Humiliation, Afraid, Rape, and Kick questionnaire    Fear of Current or Ex-Partner: No    Emotionally Abused: No  Physically Abused: No    Sexually Abused: No    Review of Systems: See HPI, otherwise negative ROS  Physical Exam: BP 129/61   Pulse 62   Temp 97.7 F (36.5 C) (Oral)   Resp 14   Wt (!) 165.6 kg   SpO2 96%   BMI 52.37 kg/m  General:   Alert,  Well-developed, well-nourished, pleasant and cooperative in NAD Heart:  Regular rate and rhythm; no murmurs, clicks, rubs,  or gallops. Abdomen:  Soft, nontender and nondistended. No masses, hepatosplenomegaly or hernias noted. Normal bowel sounds, without guarding, and without rebound.   Msk:  Symmetrical without gross deformities. Normal posture. Pulses:  Normal pulses noted. EImpression/Plan: Timothy Thomas is now here to undergo a screening colonoscopy.  Risks, benefits, limitations, imponderables and alternatives regarding colonoscopy have been reviewed with the patient. Questions have been answered. All parties agreeable.     Notice:  This dictation was prepared with Dragon dictation along with smaller phrase technology. Any transcriptional errors that result from this process are unintentional and may not be corrected upon review.

## 2022-01-28 NOTE — Discharge Instructions (Signed)
  Colonoscopy Discharge Instructions  Read the instructions outlined below and refer to this sheet in the next few weeks. These discharge instructions provide you with general information on caring for yourself after you leave the hospital. Your doctor may also give you specific instructions. While your treatment has been planned according to the most current medical practices available, unavoidable complications occasionally occur. If you have any problems or questions after discharge, call Dr. Jena Gauss at [phone]. ACTIVITY You may resume your regular activity, but move at a slower pace for the next 24 hours.  Take frequent rest periods for the next 24 hours.  Walking will help get rid of the air and reduce the bloated feeling in your belly (abdomen).  No driving for 24 hours (because of the medicine (anesthesia) used during the test).   Do not sign any important legal documents or operate any machinery for 24 hours (because of the anesthesia used during the test).  NUTRITION Drink plenty of fluids.  You may resume your normal diet as instructed by your doctor.  Begin with a light meal and progress to your normal diet. Heavy or fried foods are harder to digest and may make you feel sick to your stomach (nauseated).  Avoid alcoholic beverages for 24 hours or as instructed.  MEDICATIONS You may resume your normal medications unless your doctor tells you otherwise.  WHAT YOU CAN EXPECT TODAY Some feelings of bloating in the abdomen.  Passage of more gas than usual.  Spotting of blood in your stool or on the toilet paper.  IF YOU HAD POLYPS REMOVED DURING THE COLONOSCOPY: No aspirin products for 7 days or as instructed.  No alcohol for 7 days or as instructed.  Eat a soft diet for the next 24 hours.  FINDING OUT THE RESULTS OF YOUR TEST Not all test results are available during your visit. If your test results are not back during the visit, make an appointment with your caregiver to find out the  results. Do not assume everything is normal if you have not heard from your caregiver or the medical facility. It is important for you to follow up on all of your test results.  SEEK IMMEDIATE MEDICAL ATTENTION IF: You have more than a spotting of blood in your stool.  Your belly is swollen (abdominal distention).  You are nauseated or vomiting.  You have a temperature over 101.  You have abdominal pain or discomfort that is severe or gets worse throughout the day.      Diverticulosis and colon polyp information provided  4 polyps removed from your colon  Other recommendations to follow pending review of pathology report  Resume Coumadin today.  Resume Lovenox per hematologist instructions  At patient request, I called Chalmers Iddings at [phone] -reviewed findings and recommendations

## 2022-01-28 NOTE — Transfer of Care (Signed)
Immediate Anesthesia Transfer of Care Note  Patient: Timothy Thomas  Procedure(s) Performed: COLONOSCOPY WITH PROPOFOL POLYPECTOMY  Patient Location: PACU  Anesthesia Type:MAC  Level of Consciousness: awake, alert  and oriented  Airway & Oxygen Therapy: Patient Spontanous Breathing and Patient connected to face mask oxygen  Post-op Assessment: Report given to RN and Post -op Vital signs reviewed and stable  Post vital signs: Reviewed and stable  Last Vitals:  Vitals Value Taken Time  BP 122/78   Temp    Pulse 64   Resp 16   SpO2 96%     Last Pain:  Vitals:   01/28/22 0835  TempSrc:   PainSc: 1       Patients Stated Pain Goal: 10 (45/36/46 8032)  Complications: No notable events documented.

## 2022-01-29 ENCOUNTER — Encounter: Payer: Self-pay | Admitting: Internal Medicine

## 2022-01-29 LAB — SURGICAL PATHOLOGY

## 2022-01-29 NOTE — Anesthesia Postprocedure Evaluation (Signed)
Anesthesia Post Note  Patient: Timothy Thomas  Procedure(s) Performed: COLONOSCOPY WITH PROPOFOL POLYPECTOMY  Patient location during evaluation: Phase II Anesthesia Type: General Level of consciousness: awake Pain management: pain level controlled Vital Signs Assessment: post-procedure vital signs reviewed and stable Respiratory status: spontaneous breathing and respiratory function stable Cardiovascular status: blood pressure returned to baseline and stable Postop Assessment: no headache and no apparent nausea or vomiting Anesthetic complications: no Comments: Late entry   No notable events documented.   Last Vitals:  Vitals:   01/28/22 0735 01/28/22 0907  BP: 129/61 100/62  Pulse: 62 71  Resp: 14 (!) 21  Temp: 36.5 C 36.8 C  SpO2: 96% 96%    Last Pain:  Vitals:   01/28/22 0907  TempSrc: Oral  PainSc: 0-No pain                 Windell Norfolk

## 2022-01-30 ENCOUNTER — Telehealth: Payer: Self-pay | Admitting: Pulmonary Disease

## 2022-02-03 ENCOUNTER — Encounter (HOSPITAL_COMMUNITY): Payer: Self-pay | Admitting: Internal Medicine

## 2022-02-03 NOTE — Telephone Encounter (Signed)
Called and spoke with patient.  Patient stated he contacted [employer] and was told that they can not tell patient alternatives for Trelegy or Breo.  Patient stated he has 5 days left with his current inhaler.  Patient stated he could use nebs, but the inhalers worked better for him.  Message routed to Dr. Vassie Loll and pharmacy team to advise

## 2022-02-05 MED ORDER — IPRATROPIUM-ALBUTEROL 0.5-2.5 (3) MG/3ML IN SOLN: 3.0000 mL | Freq: Three times a day (TID) | RESPIRATORY_TRACT | 11 refills | Status: DC

## 2022-02-05 MED ORDER — BUDESONIDE 0.5 MG/2ML IN SUSP
0.5000 mg | Freq: Two times a day (BID) | RESPIRATORY_TRACT | 11 refills | Status: DC
Start: 2022-02-05 — End: 2023-09-06

## 2022-02-05 NOTE — Telephone Encounter (Signed)
Timothy Milch, MD  Timothy Thomas, Farley Ly, CMA; P Lbpu Triage Pool Caller: Unspecified (6 days ago, 12:13 PM) We can certainly prescribe nebs.  Budesonide 0.5 mg nebs twice daily.  DuoNebs 3 times daily    I called and spoke with the pt and made him aware of response per RA  He verbalized understanding  Rxs were sent to preferred pharm

## 2022-02-05 NOTE — Telephone Encounter (Signed)
Oretha Milch, MD  Jacquiline Doe, LPN; Rx Prior Auth Team Yesterday (8:47 AM)    Since he is in the donut hole ( see previous phone notes please)   All meds will be expensive.  Virgel Bouquet is cheaper than trelegy & everything else will cost him the same as [employer] and spoke with pt letting him know the info per Dr. Vassie Loll and he verbalized understanding.  Pt wants to know if neb solutions could be prescribed in place of the inhalers. States he remembers being on budesonide neb sol prior and also states that he also had ipratropium bromide as well as albuterol sol. Pt said that he knows that his insurance will pay for these.   Dr. Vassie Loll, please advise on this if we can send these neb meds in for pt.

## 2022-02-19 ENCOUNTER — Other Ambulatory Visit: Payer: Self-pay | Admitting: Pulmonary Disease

## 2022-02-24 NOTE — Assessment & Plan Note (Signed)
-   POCT glycosylated hemoglobin (Hb A1C) - CBC - Comprehensive metabolic panel    Follow up:  Follow up in 3 months or sooner if needed

## 2022-02-26 ENCOUNTER — Ambulatory Visit: Payer: Medicare HMO | Admitting: Pulmonary Disease

## 2022-02-26 ENCOUNTER — Encounter: Payer: Self-pay | Admitting: Pulmonary Disease

## 2022-02-26 DIAGNOSIS — Z720 Tobacco use: Secondary | ICD-10-CM

## 2022-02-26 DIAGNOSIS — G4733 Obstructive sleep apnea (adult) (pediatric): Secondary | ICD-10-CM

## 2022-02-26 DIAGNOSIS — J449 Chronic obstructive pulmonary disease, unspecified: Secondary | ICD-10-CM | POA: Diagnosis not present

## 2022-02-26 DIAGNOSIS — Z9989 Dependence on other enabling machines and devices: Secondary | ICD-10-CM | POA: Diagnosis not present

## 2022-02-26 MED ORDER — MONTELUKAST SODIUM 10 MG PO TABS: 10.0000 mg | ORAL_TABLET | Freq: Every day | ORAL | 3 refills | Status: DC

## 2022-02-26 MED ORDER — PREDNISONE 10 MG PO TABS: ORAL_TABLET | ORAL | 0 refills | Status: AC

## 2022-02-26 NOTE — Addendum Note (Signed)
Addended by: Fritzi Mandes D on: 02/26/2022 02:46 PM   Modules accepted: Orders

## 2022-02-26 NOTE — Addendum Note (Signed)
Addended by: Fritzi Mandes D on: 02/26/2022 12:00 PM   Modules accepted: Orders

## 2022-02-26 NOTE — Assessment & Plan Note (Addendum)
Trelegy and Memory Dance worked very well for him, unfortunately this is very expensive now that he is in the donut hole. We will continue regimen of budesonide with DuoNebs He will try to look into patient assistance for Trelegy. We have considered a biologic before but not necessary since the above inhaler seems to work well, so not truly refractory Refills on Singulair and new prescription for nebulizer supplies

## 2022-02-26 NOTE — Progress Notes (Signed)
Subjective:    Patient ID: Timothy Thomas, male    DOB: 1965/11/14, 56 y.o.   MRN: [id number]  HPI  56  yo morbidly obese smoker for FU of chronic cough, lung nodule, and asthma/COPD Has OSA on CPAP . PFTs have not shown airway obstruction. recurrent exacerbations especially triggers being smells and dust exposure seem to resemble asthma rather than COPD.  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 States he remembers being on budesonide neb sol prior and also states that he also had ipratropium bromide as well as albuterol sol >> OK to switch to budesonide + duonebs He is in the donut hole , Breo was also expensive and he has had to use nebs which she is using every 6 hours.  He used a prednisone prescription 1 time in the last 4 months. Very compliant with CPAP and denies any problems with mask or pressure, he needs a new mask.  His insurance is not accepted by any DME company and [place] , he is to use [facility] before Continues to smoke half pack per day  Significant tests/ events reviewed  12/2020 RAST Low grade to cat/dog dander & trees Ig E 594 Eos 400   Eos 02/2020 500 Alpha 1 07/2018 164   Spirometry 05/2017 >>moderate restriction with ratio of 83, FEV1 of 70% and FVC of 65%.   CT Angio of 09/2016 shows right lower lobe 1.0 cm nodule that is stable on follow-up CT in 03/2017, left lower lobe nodule is calcified and noted to be  present in the past   CT chest 05/2017 stale 29mm RLL nodule 03/2018 CT chest- stable right lower lobe nodule 6 x 5 mm groundglass, has been noted since 2018 and has been stable, calcified granuloma left lung   CT chest 03/2019 stable right lower lobe nodule   CTA chest 04/2021 no PE, no lung nodules   CPAP titration 11/2017 severe sleep apnea AHI 99/an hour, SP O2 low 83%, optimal control CPAP 13 cm H2O   Review of Systems neg for any significant sore throat, dysphagia, itching, sneezing, nasal congestion or excess/ purulent secretions, fever, chills, sweats, unintended wt loss, pleuritic or exertional cp, hempoptysis, orthopnea pnd or change in chronic leg swelling. Also denies presyncope, palpitations, heartburn, abdominal pain, nausea, vomiting, diarrhea or change in bowel or urinary habits, dysuria,hematuria, rash, arthralgias, visual complaints, headache, numbness weakness or ataxia.     Objective:   Physical Exam   Gen. Pleasant, obese, in no distress ENT - no lesions, no post nasal drip Neck: No JVD, no thyromegaly, no carotid bruits Lungs: no use of accessory muscles, no dullness to percussion, decreased without rales or rhonchi  Cardiovascular: Rhythm regular, heart sounds  normal, no murmurs or gallops, no peripheral edema Musculoskeletal: No deformities, no cyanosis or clubbing , no tremors        Assessment & Plan:

## 2022-02-26 NOTE — Assessment & Plan Note (Signed)
Smoking cessation again emphasized is the most important intervention that would add years to his life 

## 2022-02-26 NOTE — Patient Instructions (Signed)
  X Refill on prednisone & singulair   X Rx for new nasal mask & nebuliser supplies - [facility]

## 2022-02-26 NOTE — Assessment & Plan Note (Signed)
CPAP download was reviewed which shows excellent control of events on auto settings 12 to 18 cm with average pressure of 15 cm, great compliance.  CPAP is only helped improve his daytime somnolence and fatigue.  We will try to set him up with new DME, [facility] and send a prescription for a new nasal mask  Weight loss encouraged, compliance with goal of at least 4-6 hrs every night is the expectation. Advised against medications with sedative side effects Cautioned against driving when sleepy - understanding that sleepiness will vary on a day to day basis

## 2022-03-13 ENCOUNTER — Telehealth: Payer: Self-pay | Admitting: Pulmonary Disease

## 2022-03-13 DIAGNOSIS — J4489 Other specified chronic obstructive pulmonary disease: Secondary | ICD-10-CM

## 2022-03-13 NOTE — Telephone Encounter (Signed)
Called and spoke with patient. He stated that [facility] has been out of the Duoneb solution for 2 weeks. He asked for an update today and the pharmacist wasn't sure when they would have more in stock. He did mention that he has used 2 separate solutions before and [facility] was able to fill it.   Dr. Elsworth Soho, please advise if you are ok with [place] sending in 2 separate prescriptions for albuterol and ipratropium. Thanks!

## 2022-03-16 MED ORDER — IPRATROPIUM BROMIDE 0.02 % IN SOLN: 0.5000 mg | Freq: Four times a day (QID) | RESPIRATORY_TRACT | 12 refills | Status: DC

## 2022-03-16 MED ORDER — ALBUTEROL SULFATE (2.5 MG/3ML) 0.083% IN NEBU: 2.5000 mg | INHALATION_SOLUTION | Freq: Four times a day (QID) | RESPIRATORY_TRACT | 12 refills | Status: DC | PRN

## 2022-03-16 NOTE — Telephone Encounter (Signed)
Called and spoke with patient and let him know that Dr Elsworth Soho approved up sending in two different rxs for his medications. Went over medications and instructions. Patient verbalized instructions and verified pharmacy. Nothing further needed

## 2022-03-20 ENCOUNTER — Other Ambulatory Visit: Payer: Self-pay | Admitting: Pulmonary Disease

## 2022-03-24 NOTE — Telephone Encounter (Signed)
Patient is calling to request his Albuterol rescue inhaler.  He stated he got the other two meds that were sent but he did not get the inhaler.  Please advise.  CB# [phone]

## 2022-03-27 ENCOUNTER — Telehealth: Payer: Self-pay | Admitting: Pulmonary Disease

## 2022-03-30 NOTE — Telephone Encounter (Signed)
Called and spoke to patient and went over medication he needed refilled and what pharmacy he would like it to go to. Nothing further needed

## 2022-04-06 ENCOUNTER — Ambulatory Visit: Payer: Medicare HMO | Admitting: Nurse Practitioner

## 2022-04-27 ENCOUNTER — Ambulatory Visit: Payer: Medicare HMO | Admitting: Nurse Practitioner

## 2022-06-12 ENCOUNTER — Telehealth: Payer: Self-pay | Admitting: Pulmonary Disease

## 2022-06-12 ENCOUNTER — Other Ambulatory Visit: Payer: Self-pay

## 2022-06-12 MED ORDER — TRELEGY ELLIPTA 200-62.5-25 MCG/ACT IN AEPB: 1.0000 | INHALATION_SPRAY | Freq: Every day | RESPIRATORY_TRACT | 5 refills | Status: DC

## 2022-06-12 NOTE — Telephone Encounter (Signed)
Patient called stating he would like to restart his Trelegy 200 prescription. He was in the donut hole last year, but states he has spoken to his insurance and they will start covering this again.   Dr. Elsworth Soho please advise if this is okay to restart  Pharmacy [facility] in [place]

## 2022-06-12 NOTE — Telephone Encounter (Signed)
PT needs Trelogy refill called in.  [facility] in [place] ,[place]

## 2022-06-12 NOTE — Telephone Encounter (Signed)
Spoke with patient advised refills of trelegy has been sent to pharmacy. Nothing further needed.

## 2022-06-22 ENCOUNTER — Other Ambulatory Visit: Payer: Self-pay | Admitting: Pulmonary Disease

## 2022-06-22 DIAGNOSIS — J4489 Other specified chronic obstructive pulmonary disease: Secondary | ICD-10-CM

## 2022-07-02 ENCOUNTER — Other Ambulatory Visit: Payer: Self-pay | Admitting: Pulmonary Disease

## 2022-07-15 ENCOUNTER — Telehealth: Payer: Self-pay

## 2022-07-15 NOTE — Telephone Encounter (Signed)
Called and notified patients wife that form was filled out and signed by Dr. Elsworth Soho and we are faxing to [phone]

## 2022-07-15 NOTE — Telephone Encounter (Signed)
Patients wife needs medical form filled out and signed by end of day so power isnt shut off. Papers placed on Dr. Angus Palms desk with a sticky note.

## 2022-07-20 ENCOUNTER — Other Ambulatory Visit: Payer: Self-pay | Admitting: Pulmonary Disease

## 2022-07-20 DIAGNOSIS — R053 Chronic cough: Secondary | ICD-10-CM

## 2022-07-27 ENCOUNTER — Other Ambulatory Visit: Payer: Self-pay | Admitting: Pulmonary Disease

## 2022-07-27 ENCOUNTER — Ambulatory Visit: Payer: Medicare HMO | Admitting: Pulmonary Disease

## 2022-07-29 ENCOUNTER — Telehealth: Payer: Self-pay | Admitting: *Deleted

## 2022-07-29 MED ORDER — ALBUTEROL SULFATE HFA 108 (90 BASE) MCG/ACT IN AERS: INHALATION_SPRAY | RESPIRATORY_TRACT | 3 refills | Status: DC

## 2022-07-29 NOTE — Telephone Encounter (Signed)
Per Dr, Elsworth Soho: Last OV 02/26/2022 Return in about 4 months (around 06/28/2022).  OK refill albuterol MDI.

## 2022-07-29 NOTE — Telephone Encounter (Signed)
Refill sent.

## 2022-07-29 NOTE — Telephone Encounter (Signed)
Patient would like albuterol inhaler refill sent over to [facility] in [place]

## 2022-08-18 ENCOUNTER — Telehealth (HOSPITAL_BASED_OUTPATIENT_CLINIC_OR_DEPARTMENT_OTHER): Payer: Self-pay | Admitting: Pulmonary Disease

## 2022-08-18 ENCOUNTER — Telehealth: Payer: Self-pay | Admitting: Pulmonary Disease

## 2022-08-18 NOTE — Telephone Encounter (Signed)
Pt. Wife is calling need to get a fax or email about getting the power back on over to the office because the pt. Is on CPAP at night AND TAKES NEB. TREATMENTS

## 2022-08-18 NOTE — Telephone Encounter (Signed)
Per Mendel Ryder form was faxed and received fax confirmation.  ATC patients wife.  Left Erline Levine a vm letting her know form was faxed and to call us if they needed anything further.

## 2022-08-18 NOTE — Telephone Encounter (Signed)
Called and spoke to Timothy Thomas and she states their electricity has been shut off.  They need a new form faxed today to [phone] signed and filled out by Dr. Elsworth Soho to have their power turned back on.  Patients wife is upset and scared for patient since he can't  use nebulizer or CPAP machine.  Will fax this form to [facility] office and send this to Dr. Elsworth Soho and Mendel Ryder as an Juluis Rainier.

## 2022-08-18 NOTE — Telephone Encounter (Signed)
Pt wife called and they turned their electricity off and cannot do nebulizer treatments and wife is very upset. Please advise. Pt wife is wanting to email from. Please call pts wife back with an update.  Previous Encounter:  Pt. Wife is calling need to get a fax or email about getting the power back on over to the office because the pt. Is on CPAP at night AND TAKES NEB. TREATMENTS

## 2022-09-07 ENCOUNTER — Telehealth: Payer: Self-pay | Admitting: Pulmonary Disease

## 2022-09-07 DIAGNOSIS — G4733 Obstructive sleep apnea (adult) (pediatric): Secondary | ICD-10-CM

## 2022-09-07 DIAGNOSIS — J449 Chronic obstructive pulmonary disease, unspecified: Secondary | ICD-10-CM

## 2022-09-07 NOTE — Telephone Encounter (Signed)
Order placed for both CPAP and Neb supplies

## 2022-09-07 NOTE — Telephone Encounter (Signed)
Patient called and states [facility] needs a new order for CPAP supplies and nebulizer supplies. [facility] states the order they have is from 2022. Please fax new order to [phone].

## 2022-09-09 ENCOUNTER — Telehealth: Payer: Self-pay | Admitting: Family Medicine

## 2022-09-09 NOTE — Telephone Encounter (Signed)
LVM for pt to schedule f/u appt since he has not been seen since 12/2021

## 2022-09-24 ENCOUNTER — Ambulatory Visit: Payer: Medicare HMO | Admitting: Pulmonary Disease

## 2022-10-07 ENCOUNTER — Ambulatory Visit: Payer: Medicare HMO | Admitting: Pulmonary Disease

## 2022-10-07 ENCOUNTER — Encounter: Payer: Self-pay | Admitting: Pulmonary Disease

## 2022-10-07 VITALS — BP 140/80 | HR 74 | Ht 66.0 in | Wt 355.0 lb

## 2022-10-07 DIAGNOSIS — J4489 Other specified chronic obstructive pulmonary disease: Secondary | ICD-10-CM

## 2022-10-07 DIAGNOSIS — G4733 Obstructive sleep apnea (adult) (pediatric): Secondary | ICD-10-CM

## 2022-10-07 DIAGNOSIS — Z720 Tobacco use: Secondary | ICD-10-CM

## 2022-10-07 DIAGNOSIS — J449 Chronic obstructive pulmonary disease, unspecified: Secondary | ICD-10-CM

## 2022-10-07 MED ORDER — PREDNISONE 10 MG PO TABS: 10.0000 mg | ORAL_TABLET | Freq: Every day | ORAL | 0 refills | Status: AC

## 2022-10-07 MED ORDER — TRELEGY ELLIPTA 200-62.5-25 MCG/ACT IN AEPB: 1.0000 | INHALATION_SPRAY | Freq: Every day | RESPIRATORY_TRACT | 0 refills | Status: DC

## 2022-10-07 NOTE — Assessment & Plan Note (Addendum)
We will keep a prednisone prescription pending in his pharmacy for sudden flares.  I once again discussed principles of prednisone self administration.  He has done well with this strategy.  We have considered biologic for him but his symptoms are otherwise well-controlled on Trelegy.  Unfortunately he gets into the donut hole in the second half of the year and ends up needing budesonide nebs instead which he can afford. Will obtain PFTs to see if this has decreased

## 2022-10-07 NOTE — Progress Notes (Signed)
Subjective:    Patient ID: Timothy Thomas, male    DOB: 03/28/1966, 57 y.o.   MRN: [id number]  HPI  57  yo morbidly obese smoker for FU of chronic cough, lung nodule, and asthma Has OSA on CPAP . PFTs have not shown airway obstruction. recurrent exacerbations especially triggers being smells and dust exposure seem to resemble asthma rather than COPD.  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 He took round of pred 2 wks ago for his wheezing. Needs refill on pred taper to have on hand. Cough is non prod.    He had a flareup of symptoms 2 weeks ago and took the prednisone prescription that was pending and his pharmacy, resolved within 3 days. He still has the flail segment in his chest where his cartilage was damaged from all the coughing and he feels that occasionally.  He is able to afford Trelegy and the for step of the year until he gets into the donut hole and then has to use budesonide and DuoNebs. CPAP is working well, denies any problems with mask or pressure.  He wanted to get a new mask and his insurance wanted him to have another sleep study.  He does not want to have another sleep study.  He obtained a machine when his mother passed away  He continues to smoke half pack per day.  He has tried some Nicorette gum and is hoping he can quit  Significant tests/ events reviewed 12/2020 RAST Low grade to cat/dog dander & trees Ig E 594 Eos  400   Eos 02/2020 500 Alpha 1 07/2018 164   Spirometry 05/2017 >>moderate restriction with ratio of 83, FEV1 of 70% and FVC of 65%.   CT Angio of 09/2016 shows right lower lobe 1.0 cm nodule that is stable on follow-up CT in 03/2017, left lower lobe nodule is calcified and noted to be present in the past   CT chest 03/2019 stable right lower lobe nodule   CTA chest 04/2021 no PE, no lung nodules   CPAP titration 11/2017 severe sleep apnea AHI 99/an hour, SP O2 low 83%, optimal control CPAP 13 cm H2O   Review of Systems neg for any significant sore throat, dysphagia, itching, sneezing, nasal congestion or excess/ purulent secretions, fever, chills, sweats, unintended wt loss, pleuritic or exertional cp, hempoptysis, orthopnea pnd or change in chronic leg swelling. Also denies presyncope, palpitations, heartburn, abdominal pain, nausea, vomiting, diarrhea or change in bowel or urinary habits, dysuria,hematuria, rash, arthralgias, visual complaints, headache, numbness weakness or ataxia.     Objective:   Physical Exam   Gen. Pleasant, obese, in no distress ENT - no lesions, no post nasal drip Neck: No JVD, no thyromegaly, no carotid bruits Lungs: no use of accessory muscles, no dullness to percussion, decreased without rales or rhonchi  Cardiovascular: Rhythm regular, heart sounds  normal, no murmurs  or gallops, no peripheral edema Musculoskeletal: No deformities, no cyanosis or clubbing , no tremors        Assessment & Plan:

## 2022-10-07 NOTE — Patient Instructions (Addendum)
X Trelegy sample  X schedule pFTs @ [facility]  X refill on prednisone  X lung cancer screening program

## 2022-10-07 NOTE — Assessment & Plan Note (Signed)
He is very compliant by history.  Does not want another sleep study. CPAP supplies will be renewed  Weight loss encouraged, compliance with goal of at least 4-6 hrs every night is the expectation. Advised against medications with sedative side effects Cautioned against driving when sleepy - understanding that sleepiness will vary on a day to day basis

## 2022-10-07 NOTE — Assessment & Plan Note (Signed)
Smoking cessation was again emphasized is the most important intervention. Continue nicotine gum. We will refer him to lung cancer screening program

## 2022-10-07 NOTE — Addendum Note (Signed)
Addended by: Christen Butter on: 10/07/2022 04:10 PM   Modules accepted: Orders

## 2022-10-20 ENCOUNTER — Telehealth: Payer: Self-pay | Admitting: Pulmonary Disease

## 2022-10-20 ENCOUNTER — Other Ambulatory Visit: Payer: Self-pay | Admitting: Pulmonary Disease

## 2022-10-20 MED ORDER — MONTELUKAST SODIUM 10 MG PO TABS: 10.0000 mg | ORAL_TABLET | Freq: Every day | ORAL | 3 refills | Status: DC

## 2022-10-20 NOTE — Telephone Encounter (Signed)
Rx has been refilled and sent to [facility].  Nothing further needed.

## 2022-11-20 ENCOUNTER — Other Ambulatory Visit: Payer: Self-pay

## 2022-11-20 DIAGNOSIS — Z87891 Personal history of nicotine dependence: Secondary | ICD-10-CM

## 2022-11-20 DIAGNOSIS — Z122 Encounter for screening for malignant neoplasm of respiratory organs: Secondary | ICD-10-CM

## 2022-11-20 DIAGNOSIS — F17210 Nicotine dependence, cigarettes, uncomplicated: Secondary | ICD-10-CM

## 2022-11-24 ENCOUNTER — Other Ambulatory Visit: Payer: Self-pay | Admitting: Pulmonary Disease

## 2022-11-24 ENCOUNTER — Telehealth: Payer: Self-pay | Admitting: Pulmonary Disease

## 2022-11-24 DIAGNOSIS — J4489 Other specified chronic obstructive pulmonary disease: Secondary | ICD-10-CM

## 2022-11-24 NOTE — Telephone Encounter (Signed)
Patient states needs refill for Trelegy and Albuterol solution. Pharmacy is [facility] [place]. Patient phone number is [phone].

## 2022-11-25 MED ORDER — ALBUTEROL SULFATE (2.5 MG/3ML) 0.083% IN NEBU
INHALATION_SOLUTION | RESPIRATORY_TRACT | 3 refills | Status: DC
Start: 2022-11-25 — End: 2023-09-06

## 2022-11-25 MED ORDER — TRELEGY ELLIPTA 200-62.5-25 MCG/ACT IN AEPB: 1.0000 | INHALATION_SPRAY | Freq: Every day | RESPIRATORY_TRACT | 0 refills | Status: DC

## 2022-11-25 NOTE — Telephone Encounter (Signed)
Called and spoke with patient. RX for patient has been sent in.   Nothing further needed.

## 2022-12-21 ENCOUNTER — Other Ambulatory Visit: Payer: Self-pay | Admitting: Pulmonary Disease

## 2022-12-22 ENCOUNTER — Ambulatory Visit: Payer: Medicare HMO | Admitting: Physician Assistant

## 2022-12-22 ENCOUNTER — Encounter: Payer: Self-pay | Admitting: Physician Assistant

## 2022-12-22 DIAGNOSIS — F17210 Nicotine dependence, cigarettes, uncomplicated: Secondary | ICD-10-CM | POA: Diagnosis not present

## 2022-12-22 NOTE — Patient Instructions (Signed)

## 2022-12-22 NOTE — Progress Notes (Signed)
Virtual Visit via Telephone Note  I connected with Timothy Thomas on 12/22/22 at  11:28 AM by telephone and verified that I am speaking with the correct person using two identifiers.  Location: Patient: home Provider: working virtually from home   I discussed the limitations, risks, security and privacy concerns of performing an evaluation and management service by telephone and the availability of in person appointments. I also discussed with the patient that there may be a patient responsible charge related to this service. The patient expressed understanding and agreed to proceed.     Shared Decision Making Visit Lung Cancer Screening Program [phone])   Eligibility: Age 57 Pack Years Smoking History Calculation 40 (# packs/per year x # years smoked) Recent History of coughing up blood  No Unexplained weight loss? No ( >Than 15 pounds within the last 6 months ) Prior History Lung / other cancer No (Diagnosis within the last 5 years already requiring surveillance chest CT Scans). Smoking Status Current Smoker  Visit Components: Discussion included one or more decision making aids. Yes Discussion included risk/benefits of screening. Yes Discussion included potential follow up diagnostic testing for abnormal scans. Yes Discussion included meaning and risk of over diagnosis. Yes Discussion included meaning and risk of False Positives. Yes Discussion included meaning of total radiation exposure. Yes  Counseling Included: Importance of adherence to annual lung cancer LDCT screening. Yes Impact of comorbidities on ability to participate in the program. Yes Ability and willingness to under diagnostic treatment: Yes  Smoking Cessation Counseling: Current Smokers:  Discussed importance of smoking cessation. Yes Information about tobacco cessation classes and interventions provided to patient. Yes Symptomatic Patient. No Diagnosis Code: Tobacco Use Z72.0 Asymptomatic Patient  Yes  Counseling (Intermediate counseling: > three minutes counseling) J8119 Information about tobacco cessation classes and interventions provided to patient. Yes Written Order for Lung Cancer Screening with LDCT placed in Epic. Yes (CT Chest Lung Cancer Screening Low Dose W/O CM) [id number] Z12.2-Screening of respiratory organs Z87.891-Personal history of nicotine dependence   I have spent 25 minutes of face to face/ virtual visit  time with the patient discussing the risks and benefits of lung cancer screening. We discussed the above noted topics. We paused at intervals to allow for questions to be asked and answered to ensure understanding.We discussed that the single most powerful action that anyone can take to decrease their risk of developing lung cancer is to quit smoking.  We discussed options for tools to aid in quitting smoking including nicotine replacement therapy, non-nicotine medications, support groups, Quit Smart classes, and behavior modification. We discussed that often times setting smaller, more achievable goals, such as eliminating 1 cigarette a day for a week and then 2 cigarettes a day for a week can be helpful in slowly decreasing the number of cigarettes smoked. I provided  them  with smoking cessation  information  with contact information for community resources, classes, free nicotine replacement therapy, and access to mobile apps, text messaging, and on-line smoking cessation help. I have also provided  them  the office contact information in the event they have any questions. We discussed the time and location of the scan, and that either Timothy Miyamoto RN, Timothy Lemon, RN  or I will call / send a letter with the results within 24-72 hours of receiving them. The patient verbalized understanding of all of  the above and had no further questions upon leaving the office. They have my contact information in the event they have any further  questions.  I spent two minutes counseling  on smoking cessation and the health risks of continued tobacco abuse.  I explained to the patient that there has been a high incidence of coronary artery disease noted on these exams. I explained that this is a non-gated exam therefore degree or severity cannot be determined. This patient is on statin therapy. I have asked the patient to follow-up with their PCP regarding any incidental finding of coronary artery disease and management with diet or medication as their PCP  feels is clinically indicated. The patient verbalized understanding of the above and had no further questions upon completion of the visit.    Timothy Gasman Deniece Rankin, PA-C

## 2022-12-23 ENCOUNTER — Ambulatory Visit (HOSPITAL_COMMUNITY)
Admission: RE | Admit: 2022-12-23 | Discharge: 2022-12-23 | Disposition: A | Discharge status: Home / Self Care | Payer: Medicare HMO | Source: Ambulatory Visit | Attending: Family Medicine | Admitting: Family Medicine

## 2022-12-23 DIAGNOSIS — F17210 Nicotine dependence, cigarettes, uncomplicated: Secondary | ICD-10-CM | POA: Insufficient documentation

## 2022-12-23 DIAGNOSIS — Z122 Encounter for screening for malignant neoplasm of respiratory organs: Secondary | ICD-10-CM | POA: Insufficient documentation

## 2022-12-23 DIAGNOSIS — Z87891 Personal history of nicotine dependence: Secondary | ICD-10-CM | POA: Diagnosis present

## 2022-12-29 ENCOUNTER — Other Ambulatory Visit: Payer: Self-pay | Admitting: Acute Care

## 2022-12-29 DIAGNOSIS — Z87891 Personal history of nicotine dependence: Secondary | ICD-10-CM

## 2022-12-29 DIAGNOSIS — Z122 Encounter for screening for malignant neoplasm of respiratory organs: Secondary | ICD-10-CM

## 2022-12-29 DIAGNOSIS — F17210 Nicotine dependence, cigarettes, uncomplicated: Secondary | ICD-10-CM

## 2023-01-07 ENCOUNTER — Ambulatory Visit (HOSPITAL_BASED_OUTPATIENT_CLINIC_OR_DEPARTMENT_OTHER): Payer: Medicare HMO | Admitting: Pulmonary Disease

## 2023-01-07 ENCOUNTER — Encounter (HOSPITAL_BASED_OUTPATIENT_CLINIC_OR_DEPARTMENT_OTHER): Payer: Medicare HMO

## 2023-02-12 ENCOUNTER — Other Ambulatory Visit: Payer: Self-pay | Admitting: Pulmonary Disease

## 2023-02-17 ENCOUNTER — Other Ambulatory Visit: Payer: Self-pay | Admitting: Pulmonary Disease

## 2023-02-17 DIAGNOSIS — R053 Chronic cough: Secondary | ICD-10-CM

## 2023-03-23 ENCOUNTER — Telehealth: Payer: Self-pay | Admitting: Pulmonary Disease

## 2023-03-23 ENCOUNTER — Other Ambulatory Visit: Payer: Self-pay | Admitting: Pulmonary Disease

## 2023-03-23 NOTE — Telephone Encounter (Signed)
Patient states needs refill for Albuterol inhaler. Pharmacy is [facility] [place]. Patient phone number is [phone].

## 2023-03-23 NOTE — Telephone Encounter (Signed)
Sent today

## 2023-04-08 ENCOUNTER — Ambulatory Visit (HOSPITAL_BASED_OUTPATIENT_CLINIC_OR_DEPARTMENT_OTHER): Payer: Medicare HMO | Admitting: Pulmonary Disease

## 2023-04-08 ENCOUNTER — Encounter (HOSPITAL_BASED_OUTPATIENT_CLINIC_OR_DEPARTMENT_OTHER): Payer: Self-pay | Admitting: Pulmonary Disease

## 2023-04-08 VITALS — BP 112/68 | HR 65 | Ht 73.0 in | Wt 357.2 lb

## 2023-04-08 DIAGNOSIS — G4733 Obstructive sleep apnea (adult) (pediatric): Secondary | ICD-10-CM

## 2023-04-08 DIAGNOSIS — J449 Chronic obstructive pulmonary disease, unspecified: Secondary | ICD-10-CM | POA: Diagnosis not present

## 2023-04-08 DIAGNOSIS — Z720 Tobacco use: Secondary | ICD-10-CM | POA: Diagnosis not present

## 2023-04-08 DIAGNOSIS — J4489 Other specified chronic obstructive pulmonary disease: Secondary | ICD-10-CM

## 2023-04-08 LAB — PULMONARY FUNCTION TEST
DL/VA % pred: 107 %
DL/VA: 4.63 ml/min/mmHg/L
DLCO cor % pred: 82 %
DLCO cor: 20.82 ml/min/mmHg
DLCO unc % pred: 82 %
DLCO unc: 20.82 ml/min/mmHg
FEF 25-75 Post: 1.78 L/s
FEF 25-75 Pre: 1.76 L/s
FEF2575-%Change-Post: 1 %
FEF2575-%Pred-Post: 64 %
FEF2575-%Pred-Pre: 63 %
FEV1-%Change-Post: 2 %
FEV1-%Pred-Post: 57 %
FEV1-%Pred-Pre: 56 %
FEV1-Post: 1.89 L
FEV1-Pre: 1.85 L
FEV1FVC-%Change-Post: -6 %
FEV1FVC-%Pred-Pre: 104 %
FEV6-%Change-Post: 10 %
FEV6-%Pred-Post: 62 %
FEV6-%Pred-Pre: 56 %
FEV6-Post: 2.55 L
FEV6-Pre: 2.32 L
FEV6FVC-%Pred-Post: 104 %
FEV6FVC-%Pred-Pre: 104 %
FVC-%Change-Post: 9 %
FVC-%Pred-Post: 59 %
FVC-%Pred-Pre: 54 %
FVC-Post: 2.55 L
FVC-Pre: 2.33 L
Post FEV1/FVC ratio: 74 %
Post FEV6/FVC ratio: 100 %
Pre FEV1/FVC ratio: 80 %
Pre FEV6/FVC Ratio: 100 %
RV % pred: 179 %
RV: 3.7 L
TLC % pred: 102 %
TLC: 6.53 L

## 2023-04-08 NOTE — Progress Notes (Signed)
   Subjective:    Patient ID: Timothy Thomas, male    DOB: [birth year], 57 y.o.   MRN: [id number]  HPI  57 yo morbidly obese smoker for FU of chronic cough, lung nodule, and asthma Has OSA on CPAP . PFTs have not shown airway obstruction. recurrent exacerbations especially triggers being smells and dust exposure seem to resemble asthma rather than COPD.  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 We reviewed PFTs today continues to show moderate restriction He has not had intermittent flares.  He is in the donut hole now cannot afford Trelegy he is using budesonide and albuterol/Atrovent nebs.  He has not needed prednisone.  He is very compliant with his CPAP machine he denies any problems with mask or pressure. He would like me to fill out paperwork for handicap placard and also for his hunting and fishing license   Significant tests/ events reviewed 12/2020 RAST Low grade to cat/dog dander & trees Ig E 594 Eos 400   Eos 02/2020 500 Alpha 1 07/2018 164   Spirometry 05/2017 >>moderate restriction with ratio of 83, FEV1 of 70% and FVC of 65%. PFTs 03/2023 moderate restriction, FVC 59%, FEV1 57%, normal DLCO   CT Angio of 09/2016 shows right lower lobe 1.0 cm nodule that is stable on follow-up CT in 03/2017, left lower lobe nodule is calcified and noted to be present in the past   CT chest 03/2019 stable right lower lobe nodule   CTA chest 04/2021 no PE, no lung nodules  LDCT chest 12/2022 stable right lower lobe 9 mm  nodule   CPAP titration 11/2017 severe sleep apnea AHI 99/an hour, SP O2 low 83%, optimal control CPAP 13 cm H2O    Review of Systems neg for any significant sore throat, dysphagia, itching, sneezing, nasal congestion or excess/ purulent secretions, fever, chills, sweats, unintended wt loss, pleuritic or exertional cp, hempoptysis, orthopnea pnd or change in chronic leg swelling. Also denies presyncope, palpitations, heartburn, abdominal pain, nausea, vomiting, diarrhea or change in bowel or urinary habits, dysuria,hematuria, rash, arthralgias, visual complaints, headache, numbness weakness or ataxia.     Objective:   Physical Exam  Gen. Pleasant, obese, in no distress ENT - no lesions, no post nasal drip Neck: No JVD, no thyromegaly, no carotid bruits Lungs: no use of accessory muscles, no dullness to percussion, decreased without rales or rhonchi  Cardiovascular: Rhythm regular, heart sounds  normal, no murmurs or gallops, no peripheral edema Musculoskeletal: No deformities, no cyanosis or clubbing , no tremors       Assessment & Plan:

## 2023-04-08 NOTE — Assessment & Plan Note (Signed)
Smoking cessation again emphasized. Lung cancer screening CT scan was reviewed

## 2023-04-08 NOTE — Assessment & Plan Note (Signed)
CPAP download was reviewed which shows excellent control of events on auto settings 12 to 18 cm with average pressure of 15 and maximum pressure of 16 cm.  He is very compliant more than 6 hours per night.  CPAP is only helped improve his daytime somnolence and fatigue Weight loss encouraged, compliance with goal of at least 4-6 hrs every night is the expectation. Advised against medications with sedative side effects Cautioned against driving when sleepy - understanding that sleepiness will vary on a day to day basis

## 2023-04-08 NOTE — Patient Instructions (Signed)
Full PFT Performed Today  

## 2023-04-08 NOTE — Assessment & Plan Note (Signed)
Continue budesonide and DuoNeb combinations while he is in the donut hole. At the first of the year he will transition to Trelegy 200.  We gave him some samples today Form was filled out for his handicap placard

## 2023-04-08 NOTE — Patient Instructions (Signed)
X sample of Trelegy 200 x 2

## 2023-04-08 NOTE — Progress Notes (Signed)
Full PFT Performed Today  

## 2023-05-01 ENCOUNTER — Other Ambulatory Visit: Payer: Self-pay | Admitting: Pulmonary Disease

## 2023-05-01 DIAGNOSIS — J4489 Other specified chronic obstructive pulmonary disease: Secondary | ICD-10-CM

## 2023-06-18 ENCOUNTER — Other Ambulatory Visit: Payer: Self-pay | Admitting: Pulmonary Disease

## 2023-07-25 ENCOUNTER — Other Ambulatory Visit: Payer: Self-pay | Admitting: Pulmonary Disease

## 2023-07-26 ENCOUNTER — Telehealth: Payer: Self-pay | Admitting: Pulmonary Disease

## 2023-07-26 ENCOUNTER — Other Ambulatory Visit: Payer: Self-pay | Admitting: Pulmonary Disease

## 2023-07-26 MED ORDER — PREDNISONE 10 MG PO TABS: ORAL_TABLET | ORAL | 0 refills | Status: AC

## 2023-07-26 NOTE — Telephone Encounter (Signed)
Pt states this started Sat 07/24/2023, dry hacking cough with no report of fever or sick contacts. Pt also states he usually calls Dr Vassie Loll and he gets this

## 2023-07-26 NOTE — Telephone Encounter (Signed)
Patient states having wheezing and shortness of breath. Patient would like Prednisone called into pharmacy. Triage nurse not available.Pharmacy is [facility] [place]. Patient phone number is [phone].

## 2023-07-26 NOTE — Telephone Encounter (Signed)
COPD-asthma overlap exacerbation --Prednisone taper ordered --F/u with Dr. Vassie Loll in March

## 2023-09-06 ENCOUNTER — Ambulatory Visit (HOSPITAL_BASED_OUTPATIENT_CLINIC_OR_DEPARTMENT_OTHER): Payer: Medicare HMO | Admitting: Pulmonary Disease

## 2023-09-06 ENCOUNTER — Encounter (HOSPITAL_BASED_OUTPATIENT_CLINIC_OR_DEPARTMENT_OTHER): Payer: Self-pay | Admitting: Pulmonary Disease

## 2023-09-06 DIAGNOSIS — Z87891 Personal history of nicotine dependence: Secondary | ICD-10-CM

## 2023-09-06 DIAGNOSIS — J4489 Other specified chronic obstructive pulmonary disease: Secondary | ICD-10-CM

## 2023-09-06 DIAGNOSIS — F17210 Nicotine dependence, cigarettes, uncomplicated: Secondary | ICD-10-CM

## 2023-09-06 DIAGNOSIS — Z122 Encounter for screening for malignant neoplasm of respiratory organs: Secondary | ICD-10-CM

## 2023-09-06 MED ORDER — IPRATROPIUM-ALBUTEROL 0.5-2.5 (3) MG/3ML IN SOLN: 3.0000 mL | Freq: Three times a day (TID) | RESPIRATORY_TRACT | 3 refills | Status: DC

## 2023-09-06 MED ORDER — ALBUTEROL SULFATE (2.5 MG/3ML) 0.083% IN NEBU: INHALATION_SOLUTION | RESPIRATORY_TRACT | 3 refills | Status: DC

## 2023-09-06 MED ORDER — WIXELA INHUB 250-50 MCG/ACT IN AEPB: 1.0000 | INHALATION_SPRAY | Freq: Two times a day (BID) | RESPIRATORY_TRACT | 12 refills | Status: AC

## 2023-09-06 MED ORDER — ALBUTEROL SULFATE HFA 108 (90 BASE) MCG/ACT IN AERS: INHALATION_SPRAY | RESPIRATORY_TRACT | 3 refills | Status: DC

## 2023-09-06 NOTE — Addendum Note (Signed)
 Addended by: Amada Kingfisher on: 09/06/2023 12:10 PM   Modules accepted: Orders

## 2023-09-06 NOTE — Patient Instructions (Addendum)
 X refills on albuterol MDI, DuoNebs  X CPAP supplies will be renewed  Rx for Wixela - 1 puffs twice daily  -instead of Trelegy Alternatives include generic Wixela or [employer]

## 2023-09-06 NOTE — Progress Notes (Signed)
 Subjective:    Patient ID: Timothy Thomas, male    DOB: [birth year], 58 y.o.   MRN: [id number]  HPI  58 yo morbidly obese smoker for FU of chronic cough, lung nodule, and asthma Has OSA on CPAP . PFTs have not shown airway obstruction. recurrent exacerbations especially triggers being smells and dust exposure seem to resemble asthma rather than COPD.  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   The patient, with a history of OSA, asthma, and chronic cough, reports variable breathing difficulties over the past month and a half. He attributes these difficulties to weather changes. He describes a severe episode of breathing difficulty that occurred on March 7th. Despite a breathing treatment and prednisone, his breathing worsened over the course of the day, leading to an ER visit. He was treated with Solu-Medrol, which improved his breathing within 30 minutes. Since then, he has not had any more issues.  The patient also has a history of heart attack and is scheduled for a cardiac catheterization and a pulmonary pressure echo due to some EKG changes. He has venous issues in his legs, which have been addressed by a cardiologist.  The patient was previously on Trelegy for his asthma, but due to insurance issues, he is looking for a cheaper alternative. He has been using a CPAP machine for OSA and reports that it is working well. He also has prednisone on hand for acute asthma exacerbations.  CPAP download shows excellent control of  events on auto settings 12 to 18 cm with average pressure of 15.5 cm.  He has been compliant about 6 hours per night without a single missed night, he has a small leak     Significant tests/ events reviewed 12/2020 RAST Low grade to cat/dog dander & trees Ig E 594 Eos 400   Eos 02/2020 500 Alpha 1 07/2018 164   Spirometry 05/2017 >>moderate restriction with ratio of 83, FEV1 of 70% and FVC of 65%. PFTs 03/2023 moderate restriction, FVC 59%, FEV1 57%, normal DLCO   CT Angio of 09/2016 shows right lower lobe 1.0 cm nodule that is stable on follow-up CT in 03/2017, left lower lobe nodule is calcified and noted to be present in the past   CT chest 03/2019 stable right lower lobe nodule   CTA chest 04/2021 no PE, no lung nodules   LDCT chest 12/2022 stable right lower lobe 9 mm nodule   CPAP titration 11/2017 severe sleep apnea AHI 99/an hour, SP O2 low 83%, optimal control CPAP 13 cm H2O  Review of Systems  neg for any significant sore throat, dysphagia, itching, sneezing, nasal congestion or excess/ purulent secretions, fever, chills, sweats, unintended wt loss, pleuritic or exertional cp, hempoptysis, orthopnea pnd or change in chronic leg swelling. Also denies presyncope, palpitations, heartburn, abdominal pain, nausea, vomiting, diarrhea or change in bowel or urinary habits, dysuria,hematuria, rash, arthralgias, visual complaints, headache, numbness weakness or ataxia.     Objective:  Physical Exam  Gen. Pleasant, obese, in no distress ENT - no lesions, no post nasal drip Neck: No JVD, no thyromegaly, no carotid bruits Lungs: no use of accessory muscles, no dullness to percussion, decreased without rales or rhonchi  Cardiovascular: Rhythm regular, heart sounds  normal, no murmurs or gallops, no peripheral edema Musculoskeletal: No deformities, no cyanosis or clubbing , no tremors        Assessment & Plan:    Asthma Asthma exacerbation with episodes of dyspnea, likely  triggered by weather changes and pollen exposure. Recent severe exacerbation on March 7th required ER visit and Solu-Medrol administration, which resolved symptoms. Prednisone was ineffective during this episode. Trelegy was previously effective but is no longer covered by insurance. Discussed alternative inhalers due to cost concerns. Symbicort, Vivelle, and Jerral Ralph are potential alternatives, with Symbicort being chosen for its affordability and availability in generic form. Injectable treatments were discussed as a future option if exacerbations continue. - Prescribe Symbicort as an alternative to Trelegy, to be taken twice daily. - Ensure availability of prednisone for future exacerbations. - Discuss potential for injectable treatments if exacerbations continue. - Refill prescriptions for nebulizer and albuterol inhaler. - Prescribe Duoneb for nebulizer use.  Obstructive Sleep Apnea (OSA) OSA is well-managed with CPAP therapy. CPAP settings are effective with low event numbers and adequate usage. Some mask leakage noted due to positional changes during sleep. He uses CPAP even when sitting up due to partner's back issues. - Provide prescription for CPAP mask and nebulizers. -Weight loss encouraged, compliance with goal of at least 4-6 hrs every night is the expectation. Advised against medications with sedative side effects Cautioned against driving when sleepy - understanding that sleepiness will vary on a day to day basis   Coronary Artery Disease (CAD) Heart attack with recent nuclear stress test showing EKG changes. Cardiologist recommended pulmonary pressure echo and potential cardiac catheterization. He prefers to continue care within the [facility] and has an appointment scheduled for June 2nd. He is cautious about undergoing cardiac catheterization and prefers to stay within the same healthcare network for continuity of care. - Proceed with pulmonary pressure echo on April  17th. - Attend cardiology appointment on June 2nd at [facility].

## 2023-09-07 ENCOUNTER — Telehealth (HOSPITAL_BASED_OUTPATIENT_CLINIC_OR_DEPARTMENT_OTHER): Payer: Self-pay | Admitting: Pulmonary Disease

## 2023-09-07 NOTE — Telephone Encounter (Signed)
 Looks like this approval was for his yearly CT lung screen. I spoke with pt to notify it was ordered by Kandice Robinsons, NP she has asked me to send a secure chat to Abigail Miyamoto and notify her of this.

## 2023-09-07 NOTE — Telephone Encounter (Signed)
 Copied from CRM [id number]. Topic: General - Other >> Sep 07, 2023 11:25 AM Gaetano Hawthorne wrote: Reason for CRM: Patient was seen by Dr. Vassie Loll yesterday for an office visit - patient received a letter from his insurance company noting that a CT scan had been approved. Patient was not aware of any upcomming CT scan - Please advise if this is being planned for the patient.  Please advise and call if patient is needing a CT scan or not. No active orders are showing on my end and does not mention it providers note from appointment.

## 2023-09-19 ENCOUNTER — Other Ambulatory Visit: Payer: Self-pay | Admitting: Pulmonary Disease

## 2023-09-19 DIAGNOSIS — R053 Chronic cough: Secondary | ICD-10-CM

## 2023-09-20 NOTE — Telephone Encounter (Signed)
 Dr. Alva, please advise if okay to refill. Medication not mentioned in last OV note.

## 2023-09-23 ENCOUNTER — Encounter: Payer: Self-pay | Admitting: Internal Medicine

## 2023-09-27 ENCOUNTER — Other Ambulatory Visit: Payer: Self-pay | Admitting: Pulmonary Disease

## 2023-09-27 MED ORDER — MONTELUKAST SODIUM 10 MG PO TABS: 10.0000 mg | ORAL_TABLET | Freq: Every day | ORAL | 3 refills | Status: AC

## 2023-11-08 ENCOUNTER — Encounter: Payer: Self-pay | Admitting: Internal Medicine

## 2023-11-08 ENCOUNTER — Ambulatory Visit: Attending: Internal Medicine | Admitting: Internal Medicine

## 2023-11-08 VITALS — BP 132/80 | HR 80 | Ht 70.0 in | Wt 353.4 lb

## 2023-11-08 DIAGNOSIS — I5032 Chronic diastolic (congestive) heart failure: Secondary | ICD-10-CM | POA: Diagnosis not present

## 2023-11-08 DIAGNOSIS — R9439 Abnormal result of other cardiovascular function study: Secondary | ICD-10-CM | POA: Insufficient documentation

## 2023-11-08 NOTE — Progress Notes (Signed)
 Cardiology Office Note  Date: 11/08/2023   ID: Timothy Thomas, DOB Oct 18, 1965, MRN [id number]  PCP:  Isaiah Marc, FNP  Cardiologist:  None Electrophysiologist:  None   History of Present Illness: Timothy Thomas is a 58 y.o. 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 Charolett Copes in [place] is here to establish care.  Patient has been following up with Dr. Alexandra Ice in [place].  He underwent bilateral vein ablation back in the day with successful results but had postop residual pain in the right lower extremity.  He had PE in 2018 for which she has been on chronic warfarin therapy. He is also on aspirin  81 mg once daily. I reviewed his LHC that was performed in 2018 that showed nonobstructive CAD.  Echocardiogram at that time showed LVEF 45 to 50% which improved to more than 65% on the repeat echocardiogram that was done in January.  He also underwent NM stress test in [place] recently in May 2025 that showed small to moderate reversible perfusion defect in the inferior wall consistent with ischemia for which he was suggested to undergo LHC.  Patient refused and wanted to get a second opinion at [facility].  He has chronic SOB/DOE since 2018.  No recent worsening. No angina. No dizziness, syncope, palpitations.  No leg swelling.  He reported that he takes p.o. Lasix  which does help with his leg swelling and also sometimes shortness of breath.  Pulmonary artery pressures were unable to be calculated due to inadequate TR jet.  VQ scan from 819 showed no evidence of ventilation/perfusion mismatch.    Past Medical History:  Diagnosis Date   Aphasia    Transient - negative head CT and EEG with neurology workup February 2017 [facility])   Asthma    Carotid artery  occlusion    COPD (chronic obstructive pulmonary disease) (HCC)    Depression    Diabetes mellitus without complication (HCC)    Dyspnea    Glaucoma    Macular degeneration    Myocardial infarction (HCC) 2019   Obesity    Obstructive sleep apnea    compliant   Sleep apnea    Tachycardia-bradycardia syndrome (HCC)    Status post pacemaker   Urinary retention 08/2019   Variant angina (HCC)    History of normal coronary arteries at cardiac catheterization 2002 - vasospasm noted   Vasovagal syncope     Past Surgical History:  Procedure Laterality Date   CARDIAC CATHETERIZATION     COLONOSCOPY WITH PROPOFOL  N/A 01/28/2022   Procedure: COLONOSCOPY WITH PROPOFOL ;  Surgeon: Suzette Espy, MD;  Location: [facility];  Service: Endoscopy;  Laterality: N/A;  8:30am   INCISION AND DRAINAGE ABSCESS N/A    INSERT / REPLACE / REMOVE PACEMAKER  2001   No longer working   LEFT HEART CATH AND CORONARY ANGIOGRAPHY N/A 09/28/2016   Procedure: Left Heart Cath and Coronary Angiography;  Surgeon: Pasqual Bone, MD;  Location: [facility] INV[facility] CV [facility];  Service: Cardiovascular;  Laterality: N/A;   PACEMAKER INSERTION     POLYPECTOMY  01/28/2022   Procedure: POLYPECTOMY;  Surgeon: Suzette Espy, MD;  Location: [facility];  Service: Endoscopy;;    Current Outpatient Medications  Medication Sig Dispense Refill   albuterol  (PROVENTIL ) (2.5 MG/3ML) 0.083%  nebulizer solution TAKE VIA NEBULIZATION EVERY 6 HOURS AS NEEDED FOR WHEEZING OR FOR SHORTNESS OF BREATH 375 mL 3   albuterol  (VENTOLIN  HFA) 108 (90 Base) MCG/ACT inhaler INHALE 2 PUFFS BY MOUTH EVERY 6 HOURS AS NEEDED FOR SHORTNESS OF BREATH OR WHEEZING 18 g 3   aspirin  EC 81 MG tablet Take 81 mg by mouth daily.     atorvastatin  (LIPITOR) 40 MG tablet TAKE ONE TABLET BY MOUTH EVERY EVENING AT 6PM 90 tablet 3   chlorpheniramine (CHLOR-TRIMETON) 4 MG tablet Take 4 mg by mouth at bedtime.     diphenhydramine-acetaminophen  (TYLENOL  PM) 25-500 MG  TABS tablet Take 2 tablets by mouth at bedtime.     docusate sodium  (COLACE) 100 MG capsule Take 200 mg by mouth daily.     FIBER PO Take 2 capsules by mouth daily.     fluticasone -salmeterol (WIXELA INHUB ) 250-50 MCG/ACT AEPB Inhale 1 puff into the lungs in the morning and at bedtime. 60 each 12   furosemide  (LASIX ) 20 MG tablet TAKE ONE TO TWO TABLETS BY MOUTH DAILY FOR LEG SWELLING (Patient taking differently: Take 20-40 mg by mouth See admin instructions. Take 20 mg daily, may increase to 40 mg daily as needed for swelling) 90 tablet 3   ipratropium (ATROVENT ) 0.02 % nebulizer solution INHALE 2.5 MILLILITERS VIA NEBULIZATION BY MOUTH FOUR TIMES A DAY 125 mL 6   ipratropium-albuterol  (DUONEB) 0.5-2.5 (3) MG/3ML SOLN Take 3 mLs by nebulization 3 (three) times daily. 180 mL 3   loratadine (CLARITIN) 10 MG tablet Take 10 mg by mouth daily.     metFORMIN  (GLUCOPHAGE ) 500 MG tablet TAKE ONE TABLET BY MOUTH TWICE A DAY WITH MEALS 180 tablet 1   montelukast  (SINGULAIR ) 10 MG tablet TAKE 1 TABLET BY MOUTH AT BEDTIME 30 tablet 11   omeprazole  (PRILOSEC) 20 MG capsule TAKE 1 CAPSULE BY MOUTH DAILY 30 capsule 6   predniSONE  (DELTASONE ) 10 MG tablet Take 1 tablet (10 mg total) by mouth daily with breakfast. 40 tablet 0   traZODone  (DESYREL ) 100 MG tablet TAKE ONE TABLET BY MOUTH EVERY NIGHT AT BEDTIME AS NEEDED FOR SLEEP (Patient taking differently: Take 100 mg by mouth at bedtime.) 30 tablet 6   warfarin (COUMADIN) 5 MG tablet Take 10 mg by mouth daily. 10mg  on Sunday and Wednesday  7.5 on M,T,Th,Fr,Sat     montelukast  (SINGULAIR ) 10 MG tablet Take 1 tablet (10 mg total) by mouth at bedtime. 30 tablet 3   No current facility-administered medications for this visit.   Allergies:  Codeine  and Ketorolac   Social History: The patient  reports that he has been smoking cigarettes. He started smoking about 47 years ago. He has a 47.1 pack-year smoking history. He has never used smokeless tobacco. He reports  that he does not drink alcohol and does not use drugs.   Family History: The patient's family history includes Arrhythmia in his father; Diabetes in his father; Heart attack in his father; Hypertension in his father and mother; Stroke in his father.   ROS:  Please see the history of present illness. Otherwise, complete review of systems is positive for none  All other systems are reviewed and negative.   Physical Exam: VS:  BP (!) 144/82   Pulse 80   Ht 5\' 10"  (1.778 m)   Wt (!) 353 lb 6.4 oz (160.3 kg)   SpO2 93%   BMI 50.71 kg/m , BMI Body mass index is 50.71 kg/m.  Wt Readings from Last  3 Encounters:  11/08/23 (!) 353 lb 6.4 oz (160.3 kg)  09/06/23 (!) 355 lb 4.8 oz (161.2 kg)  04/08/23 (!) 357 lb 3.2 oz (162 kg)    General: Patient appears comfortable at rest. HEENT: Conjunctiva and lids normal, oropharynx clear with moist mucosa. Neck: Supple, no elevated JVP or carotid bruits, no thyromegaly. Lungs: Clear to auscultation, nonlabored breathing at rest. Cardiac: Regular rate and rhythm, no S3 or significant systolic murmur, no pericardial rub. Abdomen: Soft, nontender, no hepatomegaly, bowel sounds present, no guarding or rebound. Extremities: No pitting edema, distal pulses 2+. Skin: Warm and dry. Musculoskeletal: No kyphosis. Neuropsychiatric: Alert and oriented x3, affect grossly appropriate.  Recent Labwork: No results found for requested labs within last 365 days.     Component Value Date/Time   CHOL 120 04/04/2021 1547   TRIG 202 (H) 04/04/2021 1547   HDL 28 (L) 04/04/2021 1547   CHOLHDL 4.3 04/04/2021 1547   CHOLHDL 8.5 09/27/2016 0432   VLDL 46 (H) 09/27/2016 0432   LDLCALC 59 04/04/2021 1547    Other Studies Reviewed Today: Reviewed records from his prior cardiologist.  Include echocardiogram, NM stress test   Assessment and Plan:  Abnormal cardiac stress test: Chronic stable DOE since 2018, no recent worsening. He underwent NM stress test with his  prior cardiologist in May 2025 that showed small to moderate reversible perfusion defect in the inferior wall consistent with ischemia.  Echocardiogram showed no evidence of RWMA, has normal LVEF.  He was recommended to undergo LHC by his previous cardiologist which the patient refused and switched his care to [facility].  Due to chronic stable DOE and no recent worsening as well as no angina, will defer LHC at this point.  Unclear if the specificity of the stress test was reliable due to severe morbid obesity.  HFimpEF: Repeat echocardiogram in May 2025 showed normal LVEF (prior LVEF from 2018 was 45 to 50%).  Has chronic stable DOE, improves with p.o. Lasix .  Continue p.o. Lasix  40 mg once daily.  History of pulm embolism in 2018: On chronic warfarin therapy.  Discontinue aspirin  while on warfarin.  OSA on CPAP: Continue CPAP daily.  Follows up with sleep medicine/Dr. Villa Greaser.       Medication Adjustments/Labs and Tests Ordered: Current medicines are reviewed at length with the patient today.  Concerns regarding medicines are outlined above.    Disposition:  Follow up 6 months  Signed Lawrencia Mauney Priya Jann Ra, MD, 11/08/2023 3:19 PM    [facility] at [facility] [address] [place], [place], [place] [address]

## 2023-11-08 NOTE — Patient Instructions (Addendum)
 Medication Instructions:  Your physician has recommended you make the following change in your medication:  Stop taking Aspirin   Continue taking all other medications as prescribed   Labwork: None   Testing/Procedures: None  Follow-Up: Your physician recommends that you schedule a follow-up appointment in: 6 months  Any Other Special Instructions Will Be Listed Below (If Applicable). Thank you for choosing [facility]!     If you need a refill on your cardiac medications before your next appointment, please call your pharmacy.

## 2023-11-16 ENCOUNTER — Other Ambulatory Visit: Payer: Self-pay | Admitting: Pulmonary Disease

## 2023-11-16 DIAGNOSIS — J4489 Other specified chronic obstructive pulmonary disease: Secondary | ICD-10-CM

## 2023-11-29 ENCOUNTER — Other Ambulatory Visit (HOSPITAL_BASED_OUTPATIENT_CLINIC_OR_DEPARTMENT_OTHER): Payer: Self-pay | Admitting: Pulmonary Disease

## 2023-12-15 ENCOUNTER — Telehealth: Payer: Self-pay | Admitting: Acute Care

## 2023-12-15 ENCOUNTER — Other Ambulatory Visit: Payer: Self-pay | Admitting: Acute Care

## 2023-12-15 DIAGNOSIS — Z87891 Personal history of nicotine dependence: Secondary | ICD-10-CM

## 2023-12-15 DIAGNOSIS — F17210 Nicotine dependence, cigarettes, uncomplicated: Secondary | ICD-10-CM

## 2023-12-15 DIAGNOSIS — Z122 Encounter for screening for malignant neoplasm of respiratory organs: Secondary | ICD-10-CM

## 2023-12-15 NOTE — Telephone Encounter (Signed)
 Left patient a VM to call for scheduling annual LDCT.  [phone] or [phone]

## 2023-12-28 ENCOUNTER — Ambulatory Visit (HOSPITAL_COMMUNITY)

## 2024-01-06 ENCOUNTER — Encounter (HOSPITAL_COMMUNITY): Payer: Self-pay

## 2024-01-06 ENCOUNTER — Ambulatory Visit (HOSPITAL_COMMUNITY)

## 2024-01-23 ENCOUNTER — Other Ambulatory Visit (HOSPITAL_BASED_OUTPATIENT_CLINIC_OR_DEPARTMENT_OTHER): Payer: Self-pay | Admitting: Pulmonary Disease

## 2024-01-23 DIAGNOSIS — J4489 Other specified chronic obstructive pulmonary disease: Secondary | ICD-10-CM

## 2024-02-01 ENCOUNTER — Ambulatory Visit (HOSPITAL_COMMUNITY)
Admission: RE | Admit: 2024-02-01 | Discharge: 2024-02-01 | Disposition: A | Discharge status: Home / Self Care | Source: Ambulatory Visit | Attending: Acute Care | Admitting: Acute Care

## 2024-02-01 DIAGNOSIS — Z87891 Personal history of nicotine dependence: Secondary | ICD-10-CM | POA: Diagnosis present

## 2024-02-01 DIAGNOSIS — Z122 Encounter for screening for malignant neoplasm of respiratory organs: Secondary | ICD-10-CM | POA: Insufficient documentation

## 2024-02-01 DIAGNOSIS — F17210 Nicotine dependence, cigarettes, uncomplicated: Secondary | ICD-10-CM | POA: Diagnosis present

## 2024-02-14 ENCOUNTER — Telehealth: Payer: Self-pay | Admitting: Acute Care

## 2024-02-14 NOTE — Telephone Encounter (Signed)
 Received call report on pt's LDCT. Impression below:  IMPRESSION: 1. Lung-RADS 2, benign appearance or behavior. Continue annual screening with low-dose chest CT without contrast in 12 months. 2. Interval development of patchy areas of ground-glass attenuation mainly in the right upper lobe although a new focus of ground-glass opacity is also seen in the right middle lobe. Imaging features are most suggestive of infectious/inflammatory etiology. Recommend follow-up non screening chest CT without contrast in 3 months after therapy to ensure resolution. 3. Aortic Atherosclerosis (ICD10-I70.0) and Emphysema (ICD10-J43.9).   These results will be called to the ordering clinician or representative by the Radiologist Assistant, and communication documented in the PACS or [employer].     Electronically Signed   By: Camellia Candle M.D.   On: 02/14/2024 11:07

## 2024-02-14 NOTE — Telephone Encounter (Signed)
 Spoke with patient and reviewed recent CT results. He states that he has been on antibiotics intermittently for for 2.5 months. Initially for a UTI, then infection at neck biopsy site. He has been on Clindamycin the last 3 weeks and will finish in 2 days. He states he currently has seasonal allergies. There was a nonproductive cough at time of call. Denies any recent respiratory issues. No coughing up blood or weight loss.   He is aware that the the results will be reviewed by the provider and we will follow back up on f/u scan.   Timothy Thomas

## 2024-02-14 NOTE — Telephone Encounter (Signed)
 Pt called back, he just remembered, advises three weeks prior his LDCT he had trouble breathing and was increasing his neb treatments, was on antibiotics and steroids for something else. States his breathing issues lasted for two weeks. Pt states he is now wondering if he possibly had a respiratory illness that went away without seeing the PCP because of the current meds he was already on.

## 2024-02-16 ENCOUNTER — Other Ambulatory Visit: Payer: Self-pay

## 2024-02-16 DIAGNOSIS — R911 Solitary pulmonary nodule: Secondary | ICD-10-CM

## 2024-02-16 DIAGNOSIS — Z87891 Personal history of nicotine dependence: Secondary | ICD-10-CM

## 2024-02-16 DIAGNOSIS — Z122 Encounter for screening for malignant neoplasm of respiratory organs: Secondary | ICD-10-CM

## 2024-02-16 NOTE — Telephone Encounter (Signed)
 Lauraine Lites, NP has reviewed notes and CT results. She recommends a 3 month nodule repeat CT.

## 2024-02-16 NOTE — Telephone Encounter (Signed)
 Called and spoke with the patient and reviewed provider recommendations. He is in agreement to review a 3 month follow up scan. Scheduled for 05/05/2024. Results and plan to PCP. Order placed.

## 2024-02-22 ENCOUNTER — Encounter (INDEPENDENT_AMBULATORY_CARE_PROVIDER_SITE_OTHER): Payer: Self-pay

## 2024-03-17 ENCOUNTER — Other Ambulatory Visit: Payer: Self-pay | Admitting: Pulmonary Disease

## 2024-03-17 NOTE — Telephone Encounter (Signed)
 Copied from CRM [id number]. Topic: Clinical - Medication Refill >> Mar 17, 2024  2:23 PM Dustin F wrote: Medication: albuterol  (VENTOLIN  HFA) 108 (90 Base) MCG/ACT inhaler   Has the patient contacted their pharmacy? Yes; was told to contact his clinic about new prescription refill being sent over. (Agent: If no, request that the patient contact the pharmacy for the refill. If patient does not wish to contact the pharmacy document the reason why and proceed with request.) (Agent: If yes, when and what did the pharmacy advise?)  This is the patient's preferred pharmacy:  [facility] [id number] - [place], [place] - [address] [place] [address] Phone: [phone] Fax: [phone]  Is this the correct pharmacy for this prescription? Yes If no, delete pharmacy and type the correct one.   Has the prescription been filled recently? Yes  Is the patient out of the medication? Yes  Has the patient been seen for an appointment in the last year OR does the patient have an upcoming appointment? Yes  Can we respond through MyChart? Yes  Agent: Please be advised that Rx refills may take up to 3 business days. We ask that you follow-up with your pharmacy.

## 2024-03-30 ENCOUNTER — Encounter (HOSPITAL_BASED_OUTPATIENT_CLINIC_OR_DEPARTMENT_OTHER): Payer: Self-pay | Admitting: Pulmonary Disease

## 2024-03-30 ENCOUNTER — Ambulatory Visit (HOSPITAL_BASED_OUTPATIENT_CLINIC_OR_DEPARTMENT_OTHER): Admitting: Pulmonary Disease

## 2024-03-30 ENCOUNTER — Institutional Professional Consult (permissible substitution) (INDEPENDENT_AMBULATORY_CARE_PROVIDER_SITE_OTHER): Admitting: Otolaryngology

## 2024-03-30 VITALS — BP 119/74 | HR 60 | Ht 70.0 in | Wt 354.0 lb

## 2024-03-30 DIAGNOSIS — E119 Type 2 diabetes mellitus without complications: Secondary | ICD-10-CM

## 2024-03-30 DIAGNOSIS — Z23 Encounter for immunization: Secondary | ICD-10-CM | POA: Diagnosis not present

## 2024-03-30 DIAGNOSIS — G4733 Obstructive sleep apnea (adult) (pediatric): Secondary | ICD-10-CM

## 2024-03-30 DIAGNOSIS — J4489 Other specified chronic obstructive pulmonary disease: Secondary | ICD-10-CM

## 2024-03-30 DIAGNOSIS — J45909 Unspecified asthma, uncomplicated: Secondary | ICD-10-CM | POA: Diagnosis not present

## 2024-03-30 DIAGNOSIS — Z794 Long term (current) use of insulin: Secondary | ICD-10-CM

## 2024-03-30 DIAGNOSIS — R911 Solitary pulmonary nodule: Secondary | ICD-10-CM

## 2024-03-30 NOTE — Progress Notes (Signed)
 Subjective:    Patient ID: Timothy Thomas, male    DOB: 11/25/1965, 58 y.o.   MRN: [id number]   58 yo morbidly obese smoker for FU of chronic cough, lung nodule, and asthma Has OSA on CPAP . PFTs have not shown airway obstruction. recurrent exacerbations especially triggers being smells and dust exposure seem to resemble asthma rather than COPD.  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  History of Present Illness Timothy Thomas is a 58 year old male with COPD and asthma who presents for follow-up of his respiratory conditions.  He experiences asthma exacerbations triggered by the smell of cooking meat and walking down the laundry detergent aisle, causing dyspnea. These symptoms resolve when he avoids the triggers. He uses Advair effectively and requires five to six breathing treatments on bad days, alternating between albuterol  and ipratropium with albuterol . He has not needed prednisone  for six months.  A previous lung scan showed a 'ground glass' appearance in the right upper lobe. At that time, he was treated for a severe UTI and facial swelling with multiple antibiotics, including tetracycline and Cipro. A repeat scan is scheduled for November.  He uses a CPAP machine with settings of 15 to 16, which he finds effective, although he requires a new  mask. He smokes less than a pack of cigarettes daily.  Right neck mass, ? Warthin's tumor resolved with residual pea-sized nodule, ENT evaluation scheduled for end of November to determine further management.   Significant tests/ events reviewed 12/2020 RAST Low grade to cat/dog dander & trees Ig E 594 Eos 400   Eos 02/2020 500 Alpha 1 07/2018 164   Spirometry 05/2017 >>moderate restriction with ratio of 83, FEV1 of 70% and FVC of 65%. PFTs 03/2023 moderate restriction, FVC 59%, FEV1 57%, normal DLCO   CT Angio of 09/2016 shows right lower lobe 1.0 cm nodule that is stable on follow-up CT in 03/2017, left lower lobe nodule is calcified and noted to be present in the past   CT chest 03/2019 stable right lower lobe nodule   CTA chest 04/2021 no PE, no lung nodules   LDCT chest 12/2022 stable right lower lobe 9 mm nodule  LDCT chest 01/2024 >> GGO BUL & RML   CPAP titration 11/2017 severe sleep apnea AHI 99/an hour, SP O2 low 83%, optimal control CPAP 13 cm H2O   Review of Systems  neg for any significant sore throat, dysphagia, itching, sneezing, nasal congestion or excess/ purulent secretions, fever, chills, sweats, unintended wt loss, pleuritic or exertional cp, hempoptysis, orthopnea pnd or change in chronic leg swelling. Also denies presyncope, palpitations, heartburn, abdominal pain, nausea, vomiting, diarrhea or change in bowel or urinary habits, dysuria,hematuria, rash, arthralgias, visual complaints, headache, numbness weakness or ataxia.  Objective:   Physical Exam  Gen. Pleasant, obese, in no distress ENT - no lesions, no post nasal drip Neck: No JVD, no thyromegaly, no carotid bruits Lungs: no use of accessory muscles, no dullness to percussion, decreased without rales or rhonchi  Cardiovascular: Rhythm regular, heart sounds  normal, no murmurs or gallops, no peripheral edema Musculoskeletal: No deformities, no cyanosis or clubbing , no tremors       Assessment &  Plan:   Assessment and Plan Assessment & Plan Chronic obstructive pulmonary disease with asthma COPD with asthma exacerbations triggered by odors such as cooking meat and laundry detergent aisles. Currently managed with Advair, which is effective. Occasional use of albuterol  and ipratropium for exacerbations. No recent need for prednisone , indicating well-controlled condition. Discussed potential for injections for asthma/COPD but deferred as current management is effective. - Continue Advair as maintenance therapy. - Use albuterol  and ipratropium as needed for exacerbations. - Keep prednisone  prescription available for severe exacerbations.  Obstructive sleep apnea Obstructive sleep apnea managed with CPAP. Current settings are effective, but a new mask is needed for optimal use. DL reviewed >> excellent usage, no residuals on auto 12-18, avg pr 15 - Obtain a new CPAP mask to ensure effective therapy.  Type 2 diabetes mellitus without complications Type 2 diabetes well-controlled with metformin . A1c remains stable at 6.2. Discussed potential for weight loss injections like Ozempic, but current management is effective and injections are not deemed necessary by primary care. - Continue metformin  as prescribed. - Discuss potential for weight loss injections with primary care provider if interested.  Tobacco use Currently smoking less than a pack a day. Gradual reduction in smoking noted. - Continue efforts to reduce smoking.  Ground-glass opacity, right upper lobe, under surveillance Ground-glass opacity in the right upper lobe noted on imaging. Likely related to recent infection or inflammation, ?aspiration/reflux , Given antibiotic. Scheduled for repeat imaging at the end of November to assess resolution. - Repeat imaging of the right upper lobe at the end of November to monitor ground-glass opacity.  Follow-Up Regular follow-up every six months or as needed. Flu shot and RSV shot  planned. - Administer flu shot during current visit. - Obtain RSV shot from pharmacy as planned.

## 2024-03-30 NOTE — Patient Instructions (Addendum)
 Continue advair & nebs  X flu shot RSV recommended   VISIT SUMMARY: Today, you came in for a follow-up visit to discuss your respiratory conditions, including COPD and asthma. We also reviewed your sleep apnea, diabetes, smoking habits, and other health concerns.  YOUR PLAN: -CHRONIC OBSTRUCTIVE PULMONARY DISEASE WITH ASTHMA: COPD and asthma are chronic lung conditions that cause breathing difficulties. Your condition is currently well-managed with Advair, and you use albuterol  and ipratropium as needed for flare-ups. You have not needed prednisone  recently, which is a good sign. Continue using Advair daily and keep albuterol  and ipratropium on hand for bad days. Keep your prednisone  prescription available for severe flare-ups.  -OBSTRUCTIVE SLEEP APNEA: Obstructive sleep apnea is a condition where your breathing stops and starts during sleep. Your CPAP machine is working well, but you need a new mask to ensure it continues to be effective. Please obtain a new CPAP mask.  -TYPE 2 DIABETES MELLITUS WITHOUT COMPLICATIONS: Type 2 diabetes is a condition where your body does not use insulin properly. Your diabetes is well-controlled with metformin , and your A1c is stable at 6.2. Continue taking metformin  as prescribed. If you are interested in weight loss injections, discuss this with your primary care provider.  -TOBACCO USE: You are currently smoking less than a pack a day and have been gradually reducing your smoking. Continue your efforts to cut down on smoking.  -GROUND-GLASS OPACITY, RIGHT UPPER LOBE, UNDER SURVEILLANCE: A ground-glass opacity is a hazy area seen on lung imaging, often due to infection or inflammation. You have a repeat scan scheduled for the end of November to check if it has resolved. Please follow through with this imaging.  INSTRUCTIONS: Please follow up every six months or as needed. Today, you will receive a flu shot. Obtain an RSV shot from the pharmacy as  planned.                      Contains text generated by [employer].                                 Contains text generated by [employer].

## 2024-04-06 ENCOUNTER — Institutional Professional Consult (permissible substitution) (INDEPENDENT_AMBULATORY_CARE_PROVIDER_SITE_OTHER): Admitting: Otolaryngology

## 2024-04-23 ENCOUNTER — Other Ambulatory Visit: Payer: Self-pay | Admitting: Pulmonary Disease

## 2024-04-23 DIAGNOSIS — R053 Chronic cough: Secondary | ICD-10-CM

## 2024-04-27 ENCOUNTER — Encounter (INDEPENDENT_AMBULATORY_CARE_PROVIDER_SITE_OTHER): Payer: Self-pay

## 2024-04-27 ENCOUNTER — Ambulatory Visit (INDEPENDENT_AMBULATORY_CARE_PROVIDER_SITE_OTHER)

## 2024-04-27 VITALS — BP 106/65 | HR 65 | Temp 98.0°F | Wt 354.0 lb

## 2024-04-27 DIAGNOSIS — D490 Neoplasm of unspecified behavior of digestive system: Secondary | ICD-10-CM

## 2024-04-27 DIAGNOSIS — D119 Benign neoplasm of major salivary gland, unspecified: Secondary | ICD-10-CM | POA: Diagnosis not present

## 2024-04-27 DIAGNOSIS — L03211 Cellulitis of face: Secondary | ICD-10-CM

## 2024-04-27 NOTE — Progress Notes (Signed)
 Dear Dr. Melanee, Here is my assessment for our mutual patient, Timothy Thomas. Thank you for allowing me the opportunity to care for your patient. Please do not hesitate to contact me should you have any other questions. Sincerely, Dr. Penne Croak  Otolaryngology Clinic Note Referring provider: Dr. Melanee HPI:  Discussed the use of AI scribe software for clinical note transcription with the patient, who gave verbal consent to proceed.  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 He was referred by the emergency room for evaluation of facial swelling and potential surgical removal of the tumor.  Left parotid mass and associated symptoms - Two-year history of a golf ball-sized mass in the left parotid region - Initial workup included Doppler ultrasound, CT scan, and needle biopsy, which identified the mass as a Warthin's tumor - After biopsy in August, developed a scab over the area, which was picked, resulting in yellow drainage for two weeks, erythema/edema and tenderness to plapation - Facial swelling developed, extending from the cheek to behind the ear - Swelling diagnosed as cellulitis and contact dermatitis - Currently, the mass is no longer palpable after expressing hard, rock-like material from the site, but a residual bump remains on the cheek  Dental issues - History of extensive dental work totaling $6,000, resulting in cracked and broken teeth - Significant dental anxiety due to prior negative experiences - refusing further dental follow ups  Anticoagulation therapy - Currently taking Coumadin for an unspecified condition  Obesity - Body mass index (BMI) of 50   Independent Review of Additional Tests or Records:  Reviewed external note from referring PCP, Gravely,describing relevant history incorporated into today's evaluation - reviewed pathology  report - warthins. Unable to see CT neck done at outside facility.   PMH/Meds/All/SocHx/FamHx/ROS:   Past Medical History:  Diagnosis Date   Aphasia    Transient - negative head CT and EEG with neurology workup February 2017 [facility])   Asthma    Carotid artery occlusion    COPD (chronic obstructive pulmonary disease) (HCC)    Depression    Diabetes mellitus without complication (HCC)    Dyspnea    Glaucoma    Macular degeneration    Myocardial infarction (HCC) 2019   Obesity    Obstructive sleep apnea    compliant   Sleep apnea    Tachycardia-bradycardia syndrome (HCC)    Status post pacemaker   Urinary retention 08/2019   Variant angina    History of normal coronary arteries at cardiac catheterization 2002 - vasospasm noted   Vasovagal syncope      Past Surgical History:  Procedure Laterality Date   CARDIAC CATHETERIZATION     COLONOSCOPY WITH PROPOFOL  N/A 01/28/2022   Procedure: COLONOSCOPY WITH PROPOFOL ;  Surgeon: Shaaron Lamar HERO, MD;  Location: [facility];  Service: Endoscopy;  Laterality: N/A;  8:30am   INCISION AND DRAINAGE ABSCESS N/A    INSERT / REPLACE / REMOVE PACEMAKER  2001   No longer working   LEFT HEART CATH AND CORONARY ANGIOGRAPHY N/A 09/28/2016   Procedure: Left Heart Cath and Coronary Angiography;  Surgeon: Salena Negri, MD;  Location: [facility];  Service: Cardiovascular;  Laterality: N/A;   PACEMAKER INSERTION     POLYPECTOMY  01/28/2022   Procedure: POLYPECTOMY;  Surgeon: Shaaron Lamar HERO, MD;  Location: [facility];  Service: Endoscopy;;    Family  History  Problem Relation Age of Onset   Hypertension Mother    Diabetes Father    Hypertension Father    Stroke Father    Arrhythmia Father    Heart attack Father    Colon cancer Neg Hx      Social Connections: Socially Integrated (01/21/2022)   Received from [facility]   Social Connection and Isolation Panel    In a typical week, how many times do you talk on the phone with  family, friends, or neighbors?: More than three times a week    How often do you get together with friends or relatives?: More than three times a week    How often do you attend church or religious services?: More than 4 times per year    Do you belong to any clubs or organizations such as church groups, unions, fraternal or athletic groups, or school groups?: Yes    How often do you attend meetings of the clubs or organizations you belong to?: 1 to 4 times per year    Are you married, widowed, divorced, separated, never married, or living with a partner?: Married      Current Outpatient Medications:    albuterol  (PROVENTIL ) (2.5 MG/3ML) 0.083% nebulizer solution, USE 3 MILLILITERS VIA NEBULIZATION BY MOUTH EVERY 6 HOURS AS NEEDED FOR FOR SHORTNESS OF BREATH OR WHEEZING, Disp: 360 mL, Rfl: 5   albuterol  (VENTOLIN  HFA) 108 (90 Base) MCG/ACT inhaler, INHALE 2 PUFFS BY MOUTH EVERY 6 HOURS AS NEEDED FOR SHORTNESS OF BREATH OR WHEEZING, Disp: 18 g, Rfl: 3   atorvastatin  (LIPITOR) 40 MG tablet, TAKE ONE TABLET BY MOUTH EVERY EVENING AT 6PM, Disp: 90 tablet, Rfl: 3   chlorpheniramine (CHLOR-TRIMETON) 4 MG tablet, Take 4 mg by mouth at bedtime., Disp: , Rfl:    diphenhydramine-acetaminophen  (TYLENOL  PM) 25-500 MG TABS tablet, Take 2 tablets by mouth at bedtime., Disp: , Rfl:    docusate sodium  (COLACE) 100 MG capsule, Take 200 mg by mouth daily., Disp: , Rfl:    FIBER PO, Take 2 capsules by mouth daily., Disp: , Rfl:    fluticasone -salmeterol (WIXELA INHUB ) 250-50 MCG/ACT AEPB, Inhale 1 puff into the lungs in the morning and at bedtime., Disp: 60 each, Rfl: 12   furosemide  (LASIX ) 20 MG tablet, TAKE ONE TO TWO TABLETS BY MOUTH DAILY FOR LEG SWELLING (Patient taking differently: Take 20-40 mg by mouth See admin instructions. Take 20 mg daily, may increase to 40 mg daily as needed for swelling), Disp: 90 tablet, Rfl: 3   ipratropium (ATROVENT ) 0.02 % nebulizer solution, INHALE 2.5ML VIA NEBULIZATION FOUR  TIMES A DAY, Disp: 125 mL, Rfl: 6   ipratropium-albuterol  (DUONEB) 0.5-2.5 (3) MG/3ML SOLN, INHALE 3 MILLILITERS VIA NEBULIZATION BY MOUTH 3 TIMES A DAY, Disp: 180 mL, Rfl: 7   loratadine (CLARITIN) 10 MG tablet, Take 10 mg by mouth daily., Disp: , Rfl:    metFORMIN  (GLUCOPHAGE ) 500 MG tablet, TAKE ONE TABLET BY MOUTH TWICE A DAY WITH MEALS, Disp: 180 tablet, Rfl: 1   montelukast  (SINGULAIR ) 10 MG tablet, TAKE 1 TABLET BY MOUTH AT BEDTIME, Disp: 30 tablet, Rfl: 11   montelukast  (SINGULAIR ) 10 MG tablet, Take 1 tablet (10 mg total) by mouth at bedtime., Disp: 30 tablet, Rfl: 3   omeprazole  (PRILOSEC) 20 MG capsule, TAKE 1 CAPSULE BY MOUTH DAILY, Disp: 30 capsule, Rfl: 6   predniSONE  (DELTASONE ) 10 MG tablet, Take 1 tablet (10 mg total) by mouth daily with breakfast., Disp: 40 tablet, Rfl:  0   traZODone  (DESYREL ) 100 MG tablet, TAKE ONE TABLET BY MOUTH EVERY NIGHT AT BEDTIME AS NEEDED FOR SLEEP, Disp: 30 tablet, Rfl: 6   warfarin (COUMADIN) 5 MG tablet, Take 10 mg by mouth daily. 10mg  on Sunday and Wednesday  7.5 on M,T,Th,Fr,Sat, Disp: , Rfl:    Physical Exam:   BP 106/65 (BP Location: Right Arm, Patient Position: Sitting, Cuff Size: Normal)   Pulse 65   Temp 98 F (36.7 C)   Wt (!) 354 lb (160.6 kg)   SpO2 95%   BMI 50.79 kg/m   The patient was awake, alert, and appropriate. The external ears were inspected, and otoscopy was performed to evaluate the external auditory canals and tympanic membranes. The nasal cavity and septum were examined for mucosal changes, obstruction, or discharge. The oral cavity and oropharynx were inspected for mucosal lesions, infection, or tonsillar hypertrophy. The neck was palpated for lymphadenopathy, thyroid abnormalities, or other masses. Cranial nerve function was grossly intact.  Pertinent Findings: Physical Exam HEENT: Atraumatic, normocephalic. External ears with no lesions. Oral cavity with many missing teeth and caries present. No palpable mass in  parotids. Left posterior cheek with well healed small scar consistent with patients report. No underlying mass, tenderness, erythema, or edema. Large neck. Full ROM. No mucosal lesions in oral cavity.  Head face scalp free of lesions.    Impression & Plans:  Dmani Mizer is a 58 y.o. male  1. Parotid tumor   2. Warthin tumor   3. Cellulitis of face    - Findings and diagnoses discussed in detail with the patient. - Risks, benefits, and alternatives were reviewed. Through shared decision making, the patient elects to proceed with below. 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 Recent cellulitis post-biopsy. Benign, no malignant transformation. Surgery not preferred due to BMI, medical history and anticoagulation. - Ordered CT scan of left parotid gland to assess tumor status. - Continue annual CT scans for monitoring. - Avoid surgery unless symptomatic. Patient prefers observation.   - Orders placed:  Orders Placed This Encounter  Procedures   CT SOFT TISSUE NECK W CONTRAST   - Medications prescribed/continued/adjusted: No orders of the defined types were placed in this encounter.  - Education materials provided to the patient. - Follow up: after imaging for [facility] record. Patient instructed to return sooner or go to the ED if new/worsening symptoms develop.   Thank you for allowing me the opportunity to care for your patient. Please do not hesitate to contact me should you have any other questions.  Sincerely, Penne Croak, DO Otolaryngologist (ENT) [facility] Phone: [phone] Fax: [phone]  04/27/2024, 11:29 AM

## 2024-04-28 ENCOUNTER — Telehealth (INDEPENDENT_AMBULATORY_CARE_PROVIDER_SITE_OTHER): Payer: Self-pay

## 2024-04-28 NOTE — Telephone Encounter (Signed)
 The patient called in with some concerns on the medical record release form he filled out in the room yesterday. I was unable to find the form in his chart or with Randine.  He is concerned that the date range of the request is wrong.

## 2024-04-28 NOTE — Telephone Encounter (Signed)
 I spoke with the patient again, the dates for the form to the [facility] hospital need to be from 12/07/2023 to 01/29/2024.  Please bring the form to tracy when completed to process. Thanks

## 2024-05-05 ENCOUNTER — Ambulatory Visit (HOSPITAL_COMMUNITY)

## 2024-05-10 ENCOUNTER — Encounter: Payer: Self-pay | Admitting: Surgical

## 2024-05-10 ENCOUNTER — Other Ambulatory Visit: Payer: Self-pay

## 2024-05-10 ENCOUNTER — Ambulatory Visit: Admitting: Surgical

## 2024-05-10 VITALS — Wt 344.8 lb

## 2024-05-10 DIAGNOSIS — M65961 Unspecified synovitis and tenosynovitis, right lower leg: Secondary | ICD-10-CM | POA: Diagnosis not present

## 2024-05-10 DIAGNOSIS — G8929 Other chronic pain: Secondary | ICD-10-CM

## 2024-05-10 DIAGNOSIS — M1611 Unilateral primary osteoarthritis, right hip: Secondary | ICD-10-CM | POA: Diagnosis not present

## 2024-05-10 DIAGNOSIS — M25561 Pain in right knee: Secondary | ICD-10-CM | POA: Diagnosis not present

## 2024-05-10 DIAGNOSIS — M25551 Pain in right hip: Secondary | ICD-10-CM

## 2024-05-10 MED ORDER — BUPIVACAINE HCL 0.25 % IJ SOLN
4.0000 mL | INTRAMUSCULAR | Status: AC | PRN
  Administered 2024-05-10: 4 mL via INTRA_ARTICULAR

## 2024-05-10 MED ORDER — LIDOCAINE HCL 1 % IJ SOLN
5.0000 mL | INTRAMUSCULAR | Status: AC | PRN
  Administered 2024-05-10: 5 mL

## 2024-05-10 MED ORDER — TRIAMCINOLONE ACETONIDE 40 MG/ML IJ SUSP
40.0000 mg | INTRAMUSCULAR | Status: AC | PRN
  Administered 2024-05-10: 40 mg via INTRA_ARTICULAR

## 2024-05-10 NOTE — Progress Notes (Signed)
 Office Visit Note   Patient: Timothy Thomas           Date of Birth: 1965-06-27           MRN: [id number] Visit Date: 05/10/2024 Requested by: Melanee Suzen RAMAN, FNP [address] [place],  [place] [address] PCP: Melanee Suzen RAMAN, FNP  Subjective: Chief Complaint  Patient presents with   right knee pain   right hip pain    HPI: Timothy Thomas is a 58 y.o. male who presents to the office reporting right hip and right knee pain.  Patient states that he has had pain worsening over the last year.  Has loss his mobility.  Describes diffuse anterior lateral hip pain as well as medial right knee pain.  No thigh pain.  Positive C sign when taking history.  Does not use cane or walker.  Pain does not wake him up from sleep at night but he has difficulty sleeping with the leg completely flat.  No history of prior surgery to the knee or hip but he has had ablations in both legs by vascular surgery for bilateral calf swelling.  Does not take any medications for pain.  Has history of COPD, asthma, MI, pulmonary embolus.  Last DVT/PE was 2019.  He takes Coumadin.  Has had to come off Coumadin with Lovenox bridge as in the past..                ROS: All systems reviewed are negative as they relate to the chief complaint within the history of present illness.  Patient denies fevers or chills.  Assessment & Plan: Visit Diagnoses:  1. Pain in right hip   2. Chronic pain of right knee     Plan: Impression is 58 year old with right knee pain and right hip pain.  Radiographically, this seems likely related to his severe end-stage right hip arthritis with minimal degenerative changes in the right knee.  He has BMI of 49.5 today and with his history of COPD, MI, PE, elevated BMI, hip replacement really not a great option for him right now.  Recommended trying cortisone injection in the right hip but currently he has cellulitis/dermatitis ongoing in the area where we would have to inject and we will  let this resolve before proceeding with injection.  Currently being treated by his PCP.  We will plan for right knee injection today to see if this will give him some partial relief but he understands that most of this pain is likely related to his hip arthritis.  AP, lateral, sunrise use of right knee reviewed.  No fracture or dislocation.  Mild degenerative changes with joint space narrowing of the medial and lateral compartments but no significant osteophyte formation.  No abnormal patellar height.  AP and frog lateral view of right hip reviewed.  No fracture or dislocation.  Severe end-stage right hip arthritis noted with joint space narrowing and osteophyte formation.  Follow-Up Instructions: No follow-ups on file.   Orders:  Orders Placed This Encounter  Procedures   DG Knee AP/LAT W/Sunrise Right   DG HIP UNILAT WITH PELVIS 2-3 VIEWS RIGHT   No orders of the defined types were placed in this encounter.     Procedures: Large Joint Inj: R knee on 05/10/2024 11:58 AM Indications: diagnostic evaluation, joint swelling and pain Details: 18 G 1.5 in needle, superolateral approach  Arthrogram: No  Medications: 5 mL lidocaine  1 %; 4 mL bupivacaine 0.25 %; 40 mg triamcinolone acetonide 40  MG/ML Outcome: tolerated well, no immediate complications Procedure, treatment alternatives, risks and benefits explained, specific risks discussed. Consent was given by the patient. Immediately prior to procedure a time out was called to verify the correct patient, procedure, equipment, support staff and site/side marked as required. Patient was prepped and draped in the usual sterile fashion.       Clinical Data: No additional findings.  Objective: Vital Signs: Wt (!) 344 lb 12.8 oz (156.4 kg)   BMI 49.47 kg/m   Physical Exam:  Constitutional: Patient appears well-developed HEENT:  Head: Normocephalic Eyes:EOM are normal Neck: Normal range of motion Cardiovascular: Normal  rate Pulmonary/chest: Effort normal Neurologic: Patient is alert Skin: Skin is warm Psychiatric: Patient has normal mood and affect  Ortho Exam: Ortho exam demonstrates right knee with 0 degrees extension and 115 degrees of knee flexion.  Does have moderate tenderness over the medial joint line and no tenderness over the lateral joint line.  Stable to anterior and posterior drawer sign.  Intact ankle dorsiflexion, plantarflexion, quad extension, hamstring flexion.  Hip flexion is limited due to pain but he has 4/5 strength.  He has decreased active hip flexion compared with contralateral side.  Decreased internal rotation of the right hip relative to the left hip with 0 degrees internal rotation on the right versus 15 degrees on the left.  Positive FADIR sign.  Positive Stinchfield sign.  No effusion in the right knee.  No cellulitis or skin changes noted overlying the right knee but he does have some cellulitis in the groin crease as well as underneath the abdominal pannus.  Specialty Comments:  No specialty comments available.  Imaging: No results found.   PMFS History: Patient Active Problem List   Diagnosis Date Noted   Positive cardiac stress test 11/08/2023   Heart failure with improved ejection fraction (HFimpEF) (HCC) 11/08/2023   Colon cancer screening 10/28/2021   Diabetes (HCC) 04/04/2021   Shortness of breath 08/13/2019   Type 2 diabetes mellitus without complication, without long-term current use of insulin (HCC) 08/13/2019   Macular degeneration of both eyes 08/13/2019   Chronic anticoagulation 04/07/2018   Diastolic dysfunction 08/27/2017   Tobacco abuse 06/09/2017   Pulmonary nodule 06/09/2017   Chronic cough 05/28/2017   OSA on CPAP 05/28/2017   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   Chronic obstructive pulmonary disease (HCC) 03/25/2017   Pain in lower limb 01/12/2017   Increased frequency of urination 01/12/2017   Hyperlipidemia 01/12/2017    Gastrointestinal hemorrhage 01/12/2017   Iron deficiency anemia 12/01/2016   Vasovagal syncope 12/01/2016   Syncope 09/26/2016   Morbid obesity (HCC) 09/26/2016   Pulmonary embolism (HCC) 09/26/2016   Acute pulmonary embolism (HCC) 09/06/2016   Aphasia 07/14/2015   Headache 07/14/2015   Presence of cardiac pacemaker 07/14/2015   Macular degeneration 07/14/2015   Glaucoma 07/14/2015   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   Past Medical History:  Diagnosis Date   Aphasia    Transient - negative head CT and EEG with neurology workup February 2017 [facility])   Asthma    Carotid artery occlusion    COPD (chronic obstructive pulmonary disease) (HCC)    Depression    Diabetes mellitus without complication (HCC)    Dyspnea    Glaucoma    Macular degeneration    Myocardial infarction (HCC) 2019   Obesity    Obstructive sleep apnea    compliant   Sleep apnea    Tachycardia-bradycardia syndrome (HCC)    Status post pacemaker  Urinary retention 08/2019   Variant angina    History of normal coronary arteries at cardiac catheterization 2002 - vasospasm noted   Vasovagal syncope     Family History  Problem Relation Age of Onset   Hypertension Mother    Diabetes Father    Hypertension Father    Stroke Father    Arrhythmia Father    Heart attack Father    Colon cancer Neg Hx     Past Surgical History:  Procedure Laterality Date   CARDIAC CATHETERIZATION     COLONOSCOPY WITH PROPOFOL  N/A 01/28/2022   Procedure: COLONOSCOPY WITH PROPOFOL ;  Surgeon: Shaaron Lamar HERO, MD;  Location: [facility];  Service: Endoscopy;  Laterality: N/A;  8:30am   INCISION AND DRAINAGE ABSCESS N/A    INSERT / REPLACE / REMOVE PACEMAKER  2001   No longer working   LEFT HEART CATH AND CORONARY ANGIOGRAPHY N/A 09/28/2016   Procedure: Left Heart Cath and Coronary Angiography;  Surgeon: Salena Negri, MD;  Location: [facility];  Service: Cardiovascular;  Laterality: N/A;   PACEMAKER INSERTION      POLYPECTOMY  01/28/2022   Procedure: POLYPECTOMY;  Surgeon: Shaaron Lamar HERO, MD;  Location: [facility];  Service: Endoscopy;;   Social History   Occupational History   Not on file  Tobacco Use   Smoking status: Every Day    Current packs/day: 1.00    Average packs/day: 1 pack/day for 47.6 years (47.6 ttl pk-yrs)    Types: Cigarettes    Start date: 09/26/1976   Smokeless tobacco: Never   Tobacco comments:    currently smoking 1/2ppd  Vaping Use   Vaping status: Never Used  Substance and Sexual Activity   Alcohol use: No   Drug use: No   Sexual activity: Yes

## 2024-05-17 ENCOUNTER — Telehealth: Payer: Self-pay

## 2024-05-17 NOTE — Telephone Encounter (Signed)
 Attempted to schedule 3 month follow up scan. Pt states he saw Dr. Jude in October and reviewed the previous scans. Patient states he spoke with his insurance company about the 3 month follow up scan, advised he could not afford $290 out of pocket. States he discussed financial concerns with Dr. Jude and was advised he could hold off on additional scans at this time. Per patient Dr. Jude was not concerned about the nodule of concern and told the patient t was okay for him to delay the scan. Pt prefers an annual Lung CT at this time. Patient is aware we are monitoring a nodule to rule out cancer.

## 2024-05-25 NOTE — Telephone Encounter (Signed)
 Patient contacted front office and would like a call back to go over his INR results and would also like to know when his iron infusion can be scheduled.

## 2024-05-25 NOTE — Telephone Encounter (Signed)
 Spoke with patient regarding INR results. INR results WNL. Informed patient at provider request patient to continue current coumadin dose and repeat INR in 1 month. Patient verbalized understanding. Also informed patient infusion team will contact patient to schedule iron infusion. Patient verbalized understanding and in agreement.

## 2024-05-26 NOTE — Telephone Encounter (Addendum)
 05/26/24 - Attempted to contact patient to schedule for IV iron.  No answer.  Left non-specific message to return call at this number.  Patient returned call.  Several appointment options given to patient.  He has requested 1/7 at 10:30.

## 2024-06-06 NOTE — Telephone Encounter (Signed)
 Reminder set for annual scan.   Timothy Harden [name], MD  [name], NP; P [facility] Lung Nodule Pool Okay to keep his annual follow-up, ground glass nodule more likely to be infectious/inflammatory

## 2024-06-07 ENCOUNTER — Ambulatory Visit (HOSPITAL_BASED_OUTPATIENT_CLINIC_OR_DEPARTMENT_OTHER): Payer: Self-pay | Admitting: Pulmonary Disease

## 2024-06-07 DIAGNOSIS — J4489 Other specified chronic obstructive pulmonary disease: Secondary | ICD-10-CM

## 2024-06-07 MED ORDER — DOXYCYCLINE HYCLATE 100 MG PO TABS: 100.0000 mg | ORAL_TABLET | Freq: Two times a day (BID) | ORAL | 0 refills | Status: AC

## 2024-06-07 MED ORDER — PREDNISONE 10 MG PO TABS: ORAL_TABLET | ORAL | 0 refills | Status: AC

## 2024-06-07 NOTE — Telephone Encounter (Signed)
 FYI Only or Action Required?: Action required by provider: Refusing ED, requesting meds.  Patient is followed in Pulmonology for COPD, hx PE, last seen on 03/30/2024 by Timothy Harden [name], MD.  Called Nurse Triage reporting Shortness of Breath.  Symptoms began several days ago.  Interventions attempted: Nebulizer treatments increased from 4x/day to 6-8x/day with alternation of albuterol , atrovent , and duoneb nebulizers.  Symptoms are: rapidly worsening.  Triage Disposition: Go to ED Now (Notify PCP)  Patient/caregiver understands and will follow disposition?: No, refuses disposition     Copied from CRM [id number]. Topic: Clinical - Red Word Triage >> Jun 07, 2024 12:14 PM Timothy Thomas wrote: Red Word that prompted transfer to Nurse Triage: Patient [phone] states is having flare up started 2 days ago and getting worse, wheezing, and difficulty breathing,  and shortness of breath. Patient denies dizziness, fever nor pain. Patient states has a standing order if there's a flare up, patient can call Dr. Alva for predniSONE  (DELTASONE ) 10 MG tablet refill. Please advise.   [facility] [id number] - [place], [place] - [address] [place] [address] Phone: [phone] Fax: [phone] Reason for Disposition  History of prior blood clot in leg or lungs (i.e., deep vein thrombosis, pulmonary embolism)  Answer Assessment - Initial Assessment Questions This RN recommended pt go to ED for symptoms, pt refusing at this time, pt requesting prednisone  from Dr. Jude. Advised go to ED or call 911 if any worsening or new symptoms. Called on-call provider Dr. Kara, routing encounter to Dr. Kara directly per his request.   No chest pain Just increasing SOB Seen Dr. Jude for several years States doc has script for prednisone  for 35 tabs, keeps me out of hospital, works very well No changes to consciousness Little bit of wheezing and tightness like usual  when more SOB Not really weaker than usual No dizziness Past 2 days worse, having flare up per pt Having to use rescue nebulizer increase 6-8x/day, normally use 4x/day albuterol , duoneb, atrovent  nebulizers I alternate between them, do the albuterol  then the atrovent  then the duoneb No oxygen therapy Improvement when I do them but over time just gets worse than what it was before Refusing ED right now, me and Dr. Jude have this plan in order to keep me out of the ED Preferred [facility] in [place]  Advised that may need to send to [facility] in [facility] for transfer to preferred in [place], pt does not know of [facility] in [place]  Protocols used: Breathing Difficulty-A-AH

## 2024-06-07 NOTE — Telephone Encounter (Signed)
 Doxy and steroid taper sent to pharmacy.  Instructed patient to go to urgent care to get flu tested incase he needs to start tamiflu as well.  Dorn Chill, MD [facility] Office: [phone]   See Amion for personal pager PCCM on call pager [phone] until 7pm. Please call Elink 7p-7a. [phone]

## 2024-06-12 ENCOUNTER — Other Ambulatory Visit: Payer: Self-pay | Admitting: *Deleted

## 2024-06-12 ENCOUNTER — Encounter: Payer: Self-pay | Admitting: *Deleted

## 2024-06-12 DIAGNOSIS — Z87891 Personal history of nicotine dependence: Secondary | ICD-10-CM

## 2024-06-12 DIAGNOSIS — F17210 Nicotine dependence, cigarettes, uncomplicated: Secondary | ICD-10-CM

## 2024-06-12 DIAGNOSIS — Z122 Encounter for screening for malignant neoplasm of respiratory organs: Secondary | ICD-10-CM

## 2024-07-11 ENCOUNTER — Other Ambulatory Visit: Payer: Self-pay | Admitting: Pulmonary Disease

## 2024-08-31 ENCOUNTER — Ambulatory Visit (HOSPITAL_BASED_OUTPATIENT_CLINIC_OR_DEPARTMENT_OTHER): Admitting: Pulmonary Disease
